# Patient Record
Sex: Female | Born: 1980 | Race: Black or African American | Hispanic: No | Marital: Married | Smoking: Former smoker
Health system: Southern US, Community
[De-identification: ages and names within clinical notes are randomized; demographics above are authoritative.]

## PROBLEM LIST (undated history)

## (undated) ENCOUNTER — Inpatient Hospital Stay (HOSPITAL_COMMUNITY): Payer: Self-pay

## (undated) DIAGNOSIS — R748 Abnormal levels of other serum enzymes: Secondary | ICD-10-CM

## (undated) DIAGNOSIS — A549 Gonococcal infection, unspecified: Secondary | ICD-10-CM

## (undated) DIAGNOSIS — K219 Gastro-esophageal reflux disease without esophagitis: Secondary | ICD-10-CM

## (undated) DIAGNOSIS — M549 Dorsalgia, unspecified: Secondary | ICD-10-CM

## (undated) DIAGNOSIS — R569 Unspecified convulsions: Secondary | ICD-10-CM

## (undated) DIAGNOSIS — J40 Bronchitis, not specified as acute or chronic: Secondary | ICD-10-CM

## (undated) DIAGNOSIS — O009 Unspecified ectopic pregnancy without intrauterine pregnancy: Secondary | ICD-10-CM

## (undated) DIAGNOSIS — I10 Essential (primary) hypertension: Secondary | ICD-10-CM

## (undated) DIAGNOSIS — G43909 Migraine, unspecified, not intractable, without status migrainosus: Secondary | ICD-10-CM

## (undated) DIAGNOSIS — M1990 Unspecified osteoarthritis, unspecified site: Secondary | ICD-10-CM

## (undated) DIAGNOSIS — J189 Pneumonia, unspecified organism: Secondary | ICD-10-CM

## (undated) DIAGNOSIS — K589 Irritable bowel syndrome without diarrhea: Secondary | ICD-10-CM

## (undated) DIAGNOSIS — F419 Anxiety disorder, unspecified: Secondary | ICD-10-CM

## (undated) DIAGNOSIS — I739 Peripheral vascular disease, unspecified: Secondary | ICD-10-CM

## (undated) DIAGNOSIS — G8929 Other chronic pain: Secondary | ICD-10-CM

## (undated) HISTORY — PX: ABDOMINAL HYSTERECTOMY: SHX81

## (undated) HISTORY — PX: WISDOM TOOTH EXTRACTION: SHX21

## (undated) HISTORY — PX: SALPINGECTOMY: SHX328

## (undated) HISTORY — PX: LAPAROSCOPY FOR ECTOPIC PREGNANCY: SUR765

---

## 1999-09-18 ENCOUNTER — Emergency Department (HOSPITAL_COMMUNITY): Admission: EM | Admit: 1999-09-18 | Discharge: 1999-09-18 | Payer: Self-pay

## 2000-04-11 ENCOUNTER — Inpatient Hospital Stay (HOSPITAL_COMMUNITY): Admission: EM | Admit: 2000-04-11 | Discharge: 2000-04-11 | Payer: Self-pay | Admitting: Obstetrics & Gynecology

## 2000-05-22 ENCOUNTER — Other Ambulatory Visit: Admission: RE | Admit: 2000-05-22 | Discharge: 2000-05-22 | Payer: Self-pay | Admitting: Obstetrics

## 2000-06-11 ENCOUNTER — Inpatient Hospital Stay (HOSPITAL_COMMUNITY): Admission: AD | Admit: 2000-06-11 | Discharge: 2000-06-11 | Payer: Self-pay | Admitting: Obstetrics

## 2000-09-17 ENCOUNTER — Observation Stay (HOSPITAL_COMMUNITY): Admission: AD | Admit: 2000-09-17 | Discharge: 2000-09-18 | Payer: Self-pay | Admitting: *Deleted

## 2000-09-22 ENCOUNTER — Inpatient Hospital Stay (HOSPITAL_COMMUNITY): Admission: AD | Admit: 2000-09-22 | Discharge: 2000-09-22 | Payer: Self-pay | Admitting: Obstetrics

## 2000-10-22 ENCOUNTER — Inpatient Hospital Stay (HOSPITAL_COMMUNITY): Admission: AD | Admit: 2000-10-22 | Discharge: 2000-10-22 | Payer: Self-pay | Admitting: Obstetrics

## 2000-10-24 ENCOUNTER — Inpatient Hospital Stay (HOSPITAL_COMMUNITY): Admission: AD | Admit: 2000-10-24 | Discharge: 2000-10-24 | Payer: Self-pay | Admitting: Obstetrics

## 2000-10-27 ENCOUNTER — Inpatient Hospital Stay (HOSPITAL_COMMUNITY): Admission: AD | Admit: 2000-10-27 | Discharge: 2000-10-27 | Payer: Self-pay | Admitting: Obstetrics

## 2000-11-06 ENCOUNTER — Observation Stay (HOSPITAL_COMMUNITY): Admission: AD | Admit: 2000-11-06 | Discharge: 2000-11-07 | Payer: Self-pay | Admitting: Obstetrics

## 2000-11-15 ENCOUNTER — Inpatient Hospital Stay (HOSPITAL_COMMUNITY): Admission: AD | Admit: 2000-11-15 | Discharge: 2000-11-18 | Payer: Self-pay | Admitting: Obstetrics

## 2000-11-21 ENCOUNTER — Encounter: Admission: RE | Admit: 2000-11-21 | Discharge: 2001-02-19 | Payer: Self-pay | Admitting: Obstetrics

## 2001-03-24 ENCOUNTER — Inpatient Hospital Stay (HOSPITAL_COMMUNITY): Admission: AD | Admit: 2001-03-24 | Discharge: 2001-03-24 | Payer: Self-pay | Admitting: Obstetrics

## 2001-05-17 ENCOUNTER — Emergency Department (HOSPITAL_COMMUNITY): Admission: EM | Admit: 2001-05-17 | Discharge: 2001-05-17 | Payer: Self-pay | Admitting: Emergency Medicine

## 2001-10-02 ENCOUNTER — Emergency Department (HOSPITAL_COMMUNITY): Admission: EM | Admit: 2001-10-02 | Discharge: 2001-10-02 | Payer: Self-pay | Admitting: Emergency Medicine

## 2001-10-29 ENCOUNTER — Inpatient Hospital Stay (HOSPITAL_COMMUNITY): Admission: AD | Admit: 2001-10-29 | Discharge: 2001-10-29 | Payer: Self-pay | Admitting: Obstetrics

## 2002-04-20 ENCOUNTER — Inpatient Hospital Stay (HOSPITAL_COMMUNITY): Admission: AD | Admit: 2002-04-20 | Discharge: 2002-04-20 | Payer: Self-pay | Admitting: *Deleted

## 2002-11-04 ENCOUNTER — Encounter: Payer: Self-pay | Admitting: Family Medicine

## 2002-11-04 ENCOUNTER — Inpatient Hospital Stay (HOSPITAL_COMMUNITY): Admission: AD | Admit: 2002-11-04 | Discharge: 2002-11-06 | Payer: Self-pay | Admitting: Family Medicine

## 2002-12-21 ENCOUNTER — Other Ambulatory Visit: Admission: RE | Admit: 2002-12-21 | Discharge: 2002-12-21 | Payer: Self-pay | Admitting: *Deleted

## 2002-12-21 ENCOUNTER — Encounter: Admission: RE | Admit: 2002-12-21 | Discharge: 2002-12-21 | Payer: Self-pay | Admitting: *Deleted

## 2003-07-24 ENCOUNTER — Emergency Department (HOSPITAL_COMMUNITY): Admission: EM | Admit: 2003-07-24 | Discharge: 2003-07-24 | Payer: Self-pay | Admitting: Emergency Medicine

## 2003-07-24 ENCOUNTER — Encounter: Payer: Self-pay | Admitting: Emergency Medicine

## 2003-12-22 ENCOUNTER — Inpatient Hospital Stay (HOSPITAL_COMMUNITY): Admission: AD | Admit: 2003-12-22 | Discharge: 2003-12-23 | Payer: Self-pay | Admitting: Obstetrics & Gynecology

## 2003-12-24 HISTORY — PX: TUBAL LIGATION: SHX77

## 2004-01-31 ENCOUNTER — Other Ambulatory Visit: Admission: RE | Admit: 2004-01-31 | Discharge: 2004-01-31 | Payer: Self-pay | Admitting: Obstetrics and Gynecology

## 2004-03-25 ENCOUNTER — Inpatient Hospital Stay (HOSPITAL_COMMUNITY): Admission: AD | Admit: 2004-03-25 | Discharge: 2004-03-25 | Payer: Self-pay | Admitting: Obstetrics and Gynecology

## 2004-05-02 ENCOUNTER — Inpatient Hospital Stay (HOSPITAL_COMMUNITY): Admission: AD | Admit: 2004-05-02 | Discharge: 2004-05-02 | Payer: Self-pay | Admitting: Obstetrics and Gynecology

## 2004-05-09 ENCOUNTER — Inpatient Hospital Stay (HOSPITAL_COMMUNITY): Admission: AD | Admit: 2004-05-09 | Discharge: 2004-05-09 | Payer: Self-pay | Admitting: Obstetrics and Gynecology

## 2004-05-16 ENCOUNTER — Encounter (HOSPITAL_COMMUNITY): Admission: RE | Admit: 2004-05-16 | Discharge: 2004-06-15 | Payer: Self-pay | Admitting: Obstetrics and Gynecology

## 2004-06-21 ENCOUNTER — Encounter (HOSPITAL_COMMUNITY): Admission: RE | Admit: 2004-06-21 | Discharge: 2004-07-21 | Payer: Self-pay | Admitting: Obstetrics and Gynecology

## 2004-06-21 ENCOUNTER — Inpatient Hospital Stay (HOSPITAL_COMMUNITY): Admission: AD | Admit: 2004-06-21 | Discharge: 2004-06-21 | Payer: Self-pay | Admitting: Obstetrics and Gynecology

## 2004-06-27 ENCOUNTER — Inpatient Hospital Stay (HOSPITAL_COMMUNITY): Admission: AD | Admit: 2004-06-27 | Discharge: 2004-06-30 | Payer: Self-pay | Admitting: Obstetrics and Gynecology

## 2004-06-27 ENCOUNTER — Inpatient Hospital Stay (HOSPITAL_COMMUNITY): Admission: AD | Admit: 2004-06-27 | Discharge: 2004-06-27 | Payer: Self-pay | Admitting: Obstetrics and Gynecology

## 2004-07-17 ENCOUNTER — Inpatient Hospital Stay: Admission: AD | Admit: 2004-07-17 | Discharge: 2004-07-17 | Payer: Self-pay | Admitting: Obstetrics and Gynecology

## 2004-07-30 ENCOUNTER — Encounter (HOSPITAL_COMMUNITY): Admission: RE | Admit: 2004-07-30 | Discharge: 2004-08-29 | Payer: Self-pay | Admitting: Obstetrics and Gynecology

## 2004-07-30 ENCOUNTER — Inpatient Hospital Stay (HOSPITAL_COMMUNITY): Admission: AD | Admit: 2004-07-30 | Discharge: 2004-07-30 | Payer: Self-pay | Admitting: Obstetrics and Gynecology

## 2004-08-03 ENCOUNTER — Inpatient Hospital Stay (HOSPITAL_COMMUNITY): Admission: AD | Admit: 2004-08-03 | Discharge: 2004-08-03 | Payer: Self-pay | Admitting: Obstetrics and Gynecology

## 2004-08-06 ENCOUNTER — Inpatient Hospital Stay (HOSPITAL_COMMUNITY): Admission: AD | Admit: 2004-08-06 | Discharge: 2004-08-06 | Payer: Self-pay | Admitting: Obstetrics and Gynecology

## 2004-08-07 ENCOUNTER — Inpatient Hospital Stay (HOSPITAL_COMMUNITY): Admission: AD | Admit: 2004-08-07 | Discharge: 2004-08-07 | Payer: Self-pay | Admitting: Obstetrics and Gynecology

## 2004-08-14 ENCOUNTER — Inpatient Hospital Stay (HOSPITAL_COMMUNITY): Admission: AD | Admit: 2004-08-14 | Discharge: 2004-08-14 | Payer: Self-pay | Admitting: Obstetrics and Gynecology

## 2004-09-03 ENCOUNTER — Inpatient Hospital Stay (HOSPITAL_COMMUNITY): Admission: AD | Admit: 2004-09-03 | Discharge: 2004-09-03 | Payer: Self-pay | Admitting: Obstetrics and Gynecology

## 2004-09-11 ENCOUNTER — Observation Stay (HOSPITAL_COMMUNITY): Admission: AD | Admit: 2004-09-11 | Discharge: 2004-09-12 | Payer: Self-pay | Admitting: Obstetrics and Gynecology

## 2004-09-15 ENCOUNTER — Inpatient Hospital Stay (HOSPITAL_COMMUNITY): Admission: AD | Admit: 2004-09-15 | Discharge: 2004-09-15 | Payer: Self-pay | Admitting: Obstetrics and Gynecology

## 2004-09-25 ENCOUNTER — Inpatient Hospital Stay (HOSPITAL_COMMUNITY): Admission: AD | Admit: 2004-09-25 | Discharge: 2004-09-27 | Payer: Self-pay | Admitting: Obstetrics and Gynecology

## 2004-09-26 ENCOUNTER — Encounter (INDEPENDENT_AMBULATORY_CARE_PROVIDER_SITE_OTHER): Payer: Self-pay | Admitting: Specialist

## 2005-01-03 ENCOUNTER — Emergency Department (HOSPITAL_COMMUNITY): Admission: EM | Admit: 2005-01-03 | Discharge: 2005-01-03 | Payer: Self-pay | Admitting: Family Medicine

## 2005-01-23 ENCOUNTER — Emergency Department (HOSPITAL_COMMUNITY): Admission: EM | Admit: 2005-01-23 | Discharge: 2005-01-23 | Payer: Self-pay | Admitting: Family Medicine

## 2005-02-12 ENCOUNTER — Emergency Department (HOSPITAL_COMMUNITY): Admission: EM | Admit: 2005-02-12 | Discharge: 2005-02-12 | Payer: Self-pay | Admitting: Family Medicine

## 2005-05-30 ENCOUNTER — Emergency Department (HOSPITAL_COMMUNITY): Admission: EM | Admit: 2005-05-30 | Discharge: 2005-05-30 | Payer: Self-pay | Admitting: Emergency Medicine

## 2005-08-06 ENCOUNTER — Emergency Department (HOSPITAL_COMMUNITY): Admission: EM | Admit: 2005-08-06 | Discharge: 2005-08-06 | Payer: Self-pay | Admitting: Family Medicine

## 2005-09-01 ENCOUNTER — Emergency Department (HOSPITAL_COMMUNITY): Admission: EM | Admit: 2005-09-01 | Discharge: 2005-09-01 | Payer: Self-pay | Admitting: Emergency Medicine

## 2005-09-18 ENCOUNTER — Emergency Department (HOSPITAL_COMMUNITY): Admission: EM | Admit: 2005-09-18 | Discharge: 2005-09-18 | Payer: Self-pay | Admitting: Family Medicine

## 2005-12-04 ENCOUNTER — Inpatient Hospital Stay (HOSPITAL_COMMUNITY): Admission: AD | Admit: 2005-12-04 | Discharge: 2005-12-04 | Payer: Self-pay | Admitting: Obstetrics and Gynecology

## 2005-12-12 ENCOUNTER — Inpatient Hospital Stay (HOSPITAL_COMMUNITY): Admission: AD | Admit: 2005-12-12 | Discharge: 2005-12-12 | Payer: Self-pay | Admitting: Obstetrics and Gynecology

## 2005-12-17 ENCOUNTER — Other Ambulatory Visit: Admission: RE | Admit: 2005-12-17 | Discharge: 2005-12-17 | Payer: Self-pay | Admitting: Obstetrics and Gynecology

## 2006-04-16 ENCOUNTER — Encounter: Admission: RE | Admit: 2006-04-16 | Discharge: 2006-04-16 | Payer: Self-pay | Admitting: Internal Medicine

## 2006-07-24 ENCOUNTER — Emergency Department (HOSPITAL_COMMUNITY): Admission: EM | Admit: 2006-07-24 | Discharge: 2006-07-24 | Payer: Self-pay | Admitting: Family Medicine

## 2006-09-22 ENCOUNTER — Emergency Department (HOSPITAL_COMMUNITY): Admission: EM | Admit: 2006-09-22 | Discharge: 2006-09-22 | Payer: Self-pay | Admitting: Emergency Medicine

## 2006-10-23 ENCOUNTER — Inpatient Hospital Stay (HOSPITAL_COMMUNITY): Admission: AD | Admit: 2006-10-23 | Discharge: 2006-10-23 | Payer: Self-pay | Admitting: Obstetrics and Gynecology

## 2007-01-05 ENCOUNTER — Emergency Department (HOSPITAL_COMMUNITY): Admission: EM | Admit: 2007-01-05 | Discharge: 2007-01-05 | Payer: Self-pay | Admitting: Emergency Medicine

## 2007-08-21 ENCOUNTER — Emergency Department (HOSPITAL_COMMUNITY): Admission: EM | Admit: 2007-08-21 | Discharge: 2007-08-21 | Payer: Self-pay | Admitting: Emergency Medicine

## 2007-08-27 ENCOUNTER — Emergency Department (HOSPITAL_COMMUNITY): Admission: EM | Admit: 2007-08-27 | Discharge: 2007-08-28 | Payer: Self-pay | Admitting: Emergency Medicine

## 2007-12-03 ENCOUNTER — Emergency Department (HOSPITAL_COMMUNITY): Admission: EM | Admit: 2007-12-03 | Discharge: 2007-12-03 | Payer: Self-pay | Admitting: Emergency Medicine

## 2008-01-18 ENCOUNTER — Emergency Department (HOSPITAL_COMMUNITY): Admission: EM | Admit: 2008-01-18 | Discharge: 2008-01-18 | Payer: Self-pay | Admitting: Family Medicine

## 2008-04-23 ENCOUNTER — Inpatient Hospital Stay (HOSPITAL_COMMUNITY): Admission: AD | Admit: 2008-04-23 | Discharge: 2008-04-24 | Payer: Self-pay | Admitting: Family Medicine

## 2008-09-15 ENCOUNTER — Emergency Department (HOSPITAL_COMMUNITY): Admission: EM | Admit: 2008-09-15 | Discharge: 2008-09-15 | Payer: Self-pay | Admitting: Family Medicine

## 2008-09-20 ENCOUNTER — Emergency Department (HOSPITAL_COMMUNITY): Admission: EM | Admit: 2008-09-20 | Discharge: 2008-09-20 | Payer: Self-pay | Admitting: Emergency Medicine

## 2009-01-31 ENCOUNTER — Emergency Department (HOSPITAL_COMMUNITY): Admission: EM | Admit: 2009-01-31 | Discharge: 2009-01-31 | Payer: Self-pay | Admitting: Emergency Medicine

## 2009-05-10 ENCOUNTER — Emergency Department (HOSPITAL_COMMUNITY): Admission: EM | Admit: 2009-05-10 | Discharge: 2009-05-10 | Payer: Self-pay | Admitting: Emergency Medicine

## 2009-06-22 ENCOUNTER — Emergency Department (HOSPITAL_COMMUNITY): Admission: EM | Admit: 2009-06-22 | Discharge: 2009-06-22 | Payer: Self-pay | Admitting: Family Medicine

## 2010-03-06 ENCOUNTER — Other Ambulatory Visit: Admission: RE | Admit: 2010-03-06 | Discharge: 2010-03-06 | Payer: Self-pay | Admitting: Family Medicine

## 2010-09-23 ENCOUNTER — Emergency Department (HOSPITAL_COMMUNITY): Admission: EM | Admit: 2010-09-23 | Discharge: 2010-09-23 | Payer: Self-pay | Admitting: Emergency Medicine

## 2011-02-08 ENCOUNTER — Emergency Department (HOSPITAL_COMMUNITY)
Admission: EM | Admit: 2011-02-08 | Discharge: 2011-02-09 | Disposition: A | Discharge status: Home / Self Care | Payer: BC Managed Care – PPO | Attending: Emergency Medicine | Admitting: Emergency Medicine

## 2011-02-08 DIAGNOSIS — R55 Syncope and collapse: Secondary | ICD-10-CM | POA: Insufficient documentation

## 2011-02-08 DIAGNOSIS — F17200 Nicotine dependence, unspecified, uncomplicated: Secondary | ICD-10-CM | POA: Insufficient documentation

## 2011-02-08 DIAGNOSIS — J45909 Unspecified asthma, uncomplicated: Secondary | ICD-10-CM | POA: Insufficient documentation

## 2011-02-09 LAB — DIFFERENTIAL
Basophils Absolute: 0 10*3/uL (ref 0.0–0.1)
Lymphs Abs: 3.4 10*3/uL (ref 0.7–4.0)
Neutrophils Relative %: 48 % (ref 43–77)

## 2011-02-09 LAB — URINALYSIS, ROUTINE W REFLEX MICROSCOPIC
Bilirubin Urine: NEGATIVE
Leukocytes, UA: NEGATIVE
Nitrite: NEGATIVE
Protein, ur: NEGATIVE mg/dL
Specific Gravity, Urine: 1.006 (ref 1.005–1.030)
Urine Glucose, Fasting: NEGATIVE mg/dL
Urobilinogen, UA: 0.2 mg/dL (ref 0.0–1.0)

## 2011-02-09 LAB — CBC
HCT: 37 % (ref 36.0–46.0)
MCH: 30.7 pg (ref 26.0–34.0)
MCV: 90.2 fL (ref 78.0–100.0)
Platelets: 226 10*3/uL (ref 150–400)
RBC: 4.1 MIL/uL (ref 3.87–5.11)
WBC: 8 10*3/uL (ref 4.0–10.5)

## 2011-02-09 LAB — POCT I-STAT, CHEM 8
BUN: <3 mg/dL — ABNORMAL LOW (ref 6–23)
Chloride: 107 mEq/L (ref 96–112)
HCT: 37 % (ref 36.0–46.0)
Potassium: 2.9 mEq/L — ABNORMAL LOW (ref 3.5–5.1)
TCO2: 20 mmol/L (ref 0–100)

## 2011-02-09 LAB — POCT PREGNANCY, URINE: Preg Test, Ur: NEGATIVE

## 2011-02-09 LAB — SAMPLE TO BLOOD BANK

## 2011-05-10 NOTE — Discharge Summary (Signed)
[name], [name]             ACCOUNT NO.:  [id number]   MEDICAL RECORD NO.:  [id number]          PATIENT TYPE:  INP   LOCATION:  9177                          [facility]ACILITY:  [facility]   PHYSICIAN:  Naima A. Dillard, M.D. DATE OF BIRTH:  02-27-1981   DATE OF ADMISSION:  09/11/2004  DATE OF DISCHARGE:  09/12/2004                                 DISCHARGE SUMMARY   ADMITTING DIAGNOSES:  1.  Intrauterine pregnancy at 35 and three-sevenths weeks.  2.  Early labor.  3.  History of preterm delivery.   DISCHARGE DIAGNOSES:  1.  Intrauterine pregnancy at 35 and four-sevenths weeks.  2.  Preterm contractions with no cervical dilation past 4 cm.   [name] is a 30 year old gravida 4 para 1-1-1-2 who presented to [facility] at 35 and three-sevenths weeks with uterine contractions and  cervical change to 4 cm.  At that time she was contracting every 5-10  minutes.  The fetal heart rate was reactive with no decelerations.  Her  cervix was noted to be 4 cm dilated, 80% effaced, with the vertex of the  fetus ballotable.  IV pain medicine was utilized which brought therapeutic  rest for the patient.  However, contractions ceased and she did not have any  further cervical dilation.  She was in stable condition and she was  therefore discharged home.  Throughout her admission, her NST remained  reactive, her contractions remained irregular, and her cervix was unchanged  at 4 cm.  She was discharged home for Dr. Jaymes Graff with labor  precautions and she will be seen in the office of [facility] later this week.  She  is to call for any further problems or concerns.      SDM/MEDQ  D:  09/12/2004  T:  09/13/2004  Job:  [id number]
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
## 2011-05-10 NOTE — Op Note (Signed)
[name], [name]             ACCOUNT NO.:  [id number]   MEDICAL RECORD NO.:  [id number]          PATIENT TYPE:  INP   LOCATION:  9135                          [facility]   PHYSICIAN:  Hal Morales, M.D.DATE OF BIRTH:  [birth year]   DATE OF PROCEDURE:  09/26/2004  DATE OF DISCHARGE:                                 OPERATIVE REPORT   PREOPERATIVE DIAGNOSIS:  Desire for surgical sterilization.   POSTOPERATIVE DIAGNOSIS:  Desire for surgical sterilization.   OPERATION:  Postpartum tubal sterilization.   ANESTHESIA:  General orotracheal.   ESTIMATED BLOOD LOSS:  Less than 10 cubic centimeters.   COMPLICATIONS:  None.   FINDINGS:  The tubes appeared normal for the postpartum state.   DESCRIPTION OF PROCEDURE:  A preoperative discussion was held with the  patient concerning the indications for her procedure as well as the risks  involved.  Her desire for surgical sterilization was reconfirmed and her  desire for no further pregnancies.  The patient acknowledged that she was  aware of temporary methods of contraception but wants permanent  sterilization.  I reviewed with her the risks of anesthesia, bleeding,  infection, damage to adjacent organs, and failure of tubal sterilization  resulting in subsequent pregnancy.  The patient acknowledged the review of  that and wished to proceed.  She was taken to the operating room after  appropriate identification and placed on the operating table.  After the  attainment of adequate general anesthesia, the abdomen was prepped with  multiple layers of Betadine and draped as a sterile field.  Subumbilical  injection of 10 cubic centimeters of 0.25% Marcaine was undertaken and a  subumbilical incision made.  The abdomen was opened down to the peritoneum  bluntly and retractors placed.  The left fallopian tube was identified,  followed to its fimbriated end, then grasped at the isthmic portion and  elevated.  A suture of 2-0 chromic  was placed through the mesosalpinx and  tied for and aft on a knuckle of tube and a second ligature placed proximal  to that.  The intervening knuckle of tube was excised and the cut ends  cauterized.  A similar procedure was carried out on the opposite side.  Hemostasis was noted to be adequate.  The abdominal peritoneum was closed in  a pursestring fashion with 0 Vicryl.  The fascia was closed in a running  fashion with 0 Vicryl.  A subcuticular suture of 3-0 Vicryl was used to  close the skin.  A sterile dressing was applied.  The patient was awakened  from general anesthesia and taken to the recovery room in satisfactory  condition having tolerated the procedure well with sponge and instrument  counts correct.   SPECIMENS TO PATHOLOGY:  Portions of right and left tube.    VPH/MEDQ  D:  09/26/2004  T:  09/26/2004  Job:  [id number]

## 2011-05-10 NOTE — H&P (Signed)
[name], [name]                       ACCOUNT NO.:  [id number]   MEDICAL RECORD NO.:  [id number]                   PATIENT TYPE:   LOCATION:                                       [facility]   PHYSICIAN:  Mary Sella. Orlene Erm, M.D.                 DATE OF BIRTH:  1981-09-22   DATE OF ADMISSION:  DATE OF DISCHARGE:                                HISTORY & PHYSICAL   HISTORY OF PRESENT ILLNESS:  The patient is a 30 year old black female  status post spontaneous vaginal delivery in November of 2003 who desires  permanent sterilization.  The patient signed bilateral tubal ligation papers  approximately four to six weeks ago and has been considering tubal ligation  and sterilization for some time.  The patient was counseled on the risks and  benefits of the surgery and desires sterilization.   PAST MEDICAL HISTORY:  None.   PAST SURGICAL HISTORY:  None.   PAST OBSTETRICAL HISTORY:  Spontaneous vaginal delivery x2.   PAST GYNECOLOGIC HISTORY:  Negative for history of gonorrhea and Chlamydia.  No abnormal Pap smears.   MEDICATIONS:  None.   ALLERGIES:  MUSCLE RELAXANT.   SOCIAL HISTORY:  The patient denies drug, alcohol, or tobacco abuse.   FAMILY HISTORY:  Noncontributory.   PHYSICAL EXAMINATION:  VITAL SIGNS:  Pulse 89, blood pressure 132/71, weight  135.4.  HEENT:  Normocephalic, atraumatic.  NECK:  Thyroid is without masses.  HEART:  Regular rate and rhythm without murmurs, rubs, gallops.  LUNGS:  Clear to auscultation bilaterally.  ABDOMEN:  Soft, nondistended, nontender.  No hepatosplenomegaly noted.  PELVIC:  EGBUS is normal.  Vagina is pink and rugated without lesions.  Cervix is parous.  Pap smear is obtained.  Bimanual examination reveals 8 cm  nontender uterus.  No adnexal masses are palpable.  EXTREMITIES:  Without clubbing, cyanosis, edema.   ASSESSMENT/PLAN:  A 30 year old gravida 2, para 2 black female with  undesired fertility.  I have discussed with  this patient at length the  permanence of this procedure and alternative forms of contraception.  In  addition, I have discussed with her the risk of bleeding, infection, injury  to internal organs, risk of requiring laparotomy.  I have  discussed with the patient the failure rate of 1 in 200 for this procedure  and increased risk of ectopic gestation.  The patient understands these  risks and wishes to proceed.  The patient will be scheduled for  sterilization in January of 2004.                                               Mary Sella. Orlene Erm, M.D.    EMH/MEDQ  D:  12/21/2002  T:  12/21/2002  Job:  [id number]   cc:  [facility]   [facility]
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
## 2011-05-10 NOTE — Discharge Summary (Signed)
NAMEREIGHN, KAPLAN NO.:  [id number]   MEDICAL RECORD NO.:  [id number]          PATIENT TYPE:  INP   LOCATION:  9135                          FACILITY:  [facility]   PHYSICIAN:  Osborn Coho, M.D.   DATE OF BIRTH:  [birth year]   DATE OF ADMISSION:  09/25/2004  DATE OF DISCHARGE:                                 DISCHARGE SUMMARY   ADMISSION DIAGNOSES:  1.  Intrauterine pregnancy at 37 and three-sevenths weeks.  2.  Early active labor.  3.  Desires elective sterilization.   DISCHARGE DIAGNOSES:  1.  Intrauterine pregnancy at 37 and three-sevenths weeks.  2.  Early active labor.  3.  Desires elective sterilization.  4.  Status post spontaneous vaginal delivery of a female infant, Apgars 9 and      9, weighing 7 pounds 1 ounce.  5.  Nuchal cord x1.  6.  Extra digit on the baby's left hand.  7.  Status post elective bilateral tubal ligation.   HOSPITAL PROCEDURES:  1.  Spontaneous vaginal delivery of a female infant, Apgars 9 and 9, weighing      7 pounds 1 ounce.  2.  General anesthesia.  3.  Elective postpartum bilateral tubal ligation.   HOSPITAL COURSE:  The patient was admitted in early active labor and was 5-6  cm shortly thereafter.  Amniotomy was performed and her labor progressed  within the next few hours to a vaginal birth with nuchal cord x1 and infant  position of ROP.  An extra digit was noted on the baby's left hand.  Otherwise, there was nothing out of the ordinary with the delivery.  The  patient was desiring of an elective tubal ligation, which was performed on  postpartum day #1.  She tolerated this well.  It was performed under general  anesthesia.  On postpartum day #2 and postoperative day #1 she was ready to  go home.  Vital signs were stable.  Maximum temperature was 99.  Chest was  clear, heart rate regular rate and rhythm.  Abdomen was soft and  appropriately tender.  Incision was clean, dry, and intact.  Lochia was  small, extremities  within normal limits.  She is deemed to have received the  full benefit of her hospital stay and was discharged home.   DISCHARGE MEDICATIONS:  1.  Motrin 600 mg p.o. q.6h. p.r.n.  2.  Tylox one to two p.o. q.4h. p.r.n.   DISCHARGE LABORATORY DATA:  White blood cell count 13.6, hemoglobin 9.8,  platelet count 257.  RPR nonreactive.   DISCHARGE INSTRUCTIONS:  Per [facility] handout.   DISCHARGE FOLLOW-UP:  In 6 weeks or p.r.n.      MLW/MEDQ  D:  09/27/2004  T:  09/27/2004  Job:  [id number]

## 2011-05-10 NOTE — Discharge Summary (Signed)
NAMECEDAR, [name]                       ACCOUNT NO.:  [id number]   MEDICAL RECORD NO.:  [id number]                   PATIENT TYPE:  INP   LOCATION:  9154                                 [facility]ACILITY:  [facility]   PHYSICIAN:  Janine Limbo, M.D.            DATE OF BIRTH:  Jul 03, 1981   DATE OF ADMISSION:  06/27/2004  DATE OF DISCHARGE:                                 DISCHARGE SUMMARY   ADMISSION DIAGNOSES:  1. Intrauterine pregnancy at 24 and three-sevenths weeks.  2. Premature labor with positive fetal fibronectin.   DISCHARGE DIAGNOSES:  1. Intrauterine pregnancy at 24 and three-sevenths weeks.  2. Premature labor with positive fetal fibronectin.   HOSPITAL PROCEDURES:  1. Electronic fetal monitoring.  2. Magnesium sulfate tocolysis.  3. Betamethasone and Delalutin injections.   HOSPITAL COURSE:  The patient was admitted with preterm labor that had been  unresponsive to terbutaline and Motrin.  She had a positive fetal  fibronectin and a cervix of 1, 50%, and high.  GC and chlamydia cultures on  June 26, 2004 were negative.  Ultrasound showed breech presentation with an  AFI of 4.8 in one pocket.  Cervix was 1.5 cm long.  Decision was made to  obtain a NICU consult and admit for magnesium sulfate tocolysis.  Delalutin  injections would continue.  She was also given a series of betamethasone.  On hospital day #2 she was doing well with occasional cramping.  Her chest  was clear, abdomen was gravid, nontender.  The second dose of betamethasone  was given.  Group B strep was negative and Delalutin was given.  BTL papers  were signed.  On hospital day #2 the patient was doing well with only a few  contractions.  Magnesium sulfate was discontinued in the evening.  Cervix  was fingertip, 25%, and ballotable which was considered unchanged.  Fetal  heart rate was stable with no contractions.  On June 30, 2004 the patient was  24 weeks and six-sevenths and was doing well.  She denied any  contractions,  leaking or bleeding.  Vital signs were stable.  Heart rate was respiratory  rate, chest was clear, abdomen was soft and nontender.  Fetal monitor  denoted no contractions and a fetal heart rate of 150s and the patient was  deemed stable enough to discharge home by Dr. Stefano Gaul and was discharged.   DISCHARGE MEDICATIONS:  Prenatal vitamins, terbutaline, and ibuprofen.   DISCHARGE LABORATORY DATA:  Group B strep was negative.  Hemoglobin 10.9,  platelets 228.  RPR nonreactive.  Urine was negative.   DISCHARGE INSTRUCTIONS:  Include bedrest at home and medications as ordered.   DISCHARGE FOLLOW-UP:  In 1 week at [facility] or p.r.n. with premature  labor precautions.    Marie L. Williams, C.N.M.                 Janine Limbo, M.D.   MLW/MEDQ  D:  06/30/2004  T:  06/30/2004  Job:  [id number]

## 2011-07-24 ENCOUNTER — Inpatient Hospital Stay (HOSPITAL_COMMUNITY)
Admission: AD | Admit: 2011-07-24 | Discharge: 2011-07-25 | Disposition: A | Discharge status: Home / Self Care | Payer: BC Managed Care – PPO | Source: Ambulatory Visit | Attending: Obstetrics and Gynecology | Admitting: Obstetrics and Gynecology

## 2011-07-25 NOTE — Progress Notes (Signed)
Pt not in lobby.  

## 2011-09-18 LAB — DIFFERENTIAL
Basophils Relative: 1
Lymphocytes Relative: 38
Lymphs Abs: 3.3
Monocytes Absolute: 0.9
Monocytes Relative: 11
Neutro Abs: 3.8
Neutrophils Relative %: 45

## 2011-09-18 LAB — COMPREHENSIVE METABOLIC PANEL
AST: 29
Alkaline Phosphatase: 116
CO2: 28
Chloride: 107
GFR calc Af Amer: >60
Potassium: 4
Sodium: 138

## 2011-09-18 LAB — CBC
HCT: 38.1
Hemoglobin: 13.1
RBC: 4.17
RDW: 12.9

## 2011-09-18 LAB — GC/CHLAMYDIA PROBE AMP, GENITAL: Chlamydia, DNA Probe: NEGATIVE

## 2011-09-18 LAB — POCT PREGNANCY, URINE: Preg Test, Ur: NEGATIVE

## 2011-09-18 LAB — WET PREP, GENITAL

## 2011-09-23 LAB — POCT RAPID STREP A: Streptococcus, Group A Screen (Direct): NEGATIVE

## 2011-10-02 ENCOUNTER — Inpatient Hospital Stay (INDEPENDENT_AMBULATORY_CARE_PROVIDER_SITE_OTHER)
Admission: RE | Admit: 2011-10-02 | Discharge: 2011-10-02 | Disposition: A | Discharge status: Home / Self Care | Payer: Medicaid Other | Source: Ambulatory Visit | Attending: Family Medicine | Admitting: Family Medicine

## 2011-10-02 DIAGNOSIS — R109 Unspecified abdominal pain: Secondary | ICD-10-CM

## 2011-10-02 LAB — POCT URINALYSIS DIP (DEVICE)
Glucose, UA: NEGATIVE mg/dL
Ketones, ur: NEGATIVE mg/dL
Leukocytes, UA: NEGATIVE
Protein, ur: NEGATIVE mg/dL

## 2011-10-02 LAB — POCT PREGNANCY, URINE: Preg Test, Ur: NEGATIVE

## 2011-10-02 LAB — GLUCOSE, CAPILLARY: Glucose-Capillary: 79 mg/dL (ref 70–99)

## 2011-10-04 LAB — I-STAT 8, (EC8 V) (CONVERTED LAB)
BUN: <3 — ABNORMAL LOW
Chloride: 97
Glucose, Bld: 112 — ABNORMAL HIGH
HCT: 42
Hemoglobin: 14.3
Operator id: 272551
Sodium: 133 — ABNORMAL LOW
pCO2, Ven: 42.7 — ABNORMAL LOW

## 2011-10-04 LAB — POCT PREGNANCY, URINE: Operator id: 277751

## 2012-02-06 ENCOUNTER — Emergency Department (HOSPITAL_COMMUNITY)
Admission: EM | Admit: 2012-02-06 | Discharge: 2012-02-06 | Disposition: A | Discharge status: Home / Self Care | Payer: Medicaid Other | Attending: Emergency Medicine | Admitting: Emergency Medicine

## 2012-02-06 ENCOUNTER — Emergency Department (HOSPITAL_COMMUNITY): Payer: Medicaid Other

## 2012-02-06 ENCOUNTER — Other Ambulatory Visit: Payer: Self-pay

## 2012-02-06 ENCOUNTER — Encounter (HOSPITAL_COMMUNITY): Payer: Self-pay | Admitting: Emergency Medicine

## 2012-02-06 DIAGNOSIS — J3489 Other specified disorders of nose and nasal sinuses: Secondary | ICD-10-CM | POA: Insufficient documentation

## 2012-02-06 DIAGNOSIS — R1110 Vomiting, unspecified: Secondary | ICD-10-CM | POA: Insufficient documentation

## 2012-02-06 DIAGNOSIS — R059 Cough, unspecified: Secondary | ICD-10-CM | POA: Insufficient documentation

## 2012-02-06 DIAGNOSIS — R079 Chest pain, unspecified: Secondary | ICD-10-CM | POA: Insufficient documentation

## 2012-02-06 DIAGNOSIS — R0602 Shortness of breath: Secondary | ICD-10-CM | POA: Insufficient documentation

## 2012-02-06 DIAGNOSIS — R197 Diarrhea, unspecified: Secondary | ICD-10-CM | POA: Insufficient documentation

## 2012-02-06 DIAGNOSIS — J069 Acute upper respiratory infection, unspecified: Secondary | ICD-10-CM | POA: Insufficient documentation

## 2012-02-06 DIAGNOSIS — R6889 Other general symptoms and signs: Secondary | ICD-10-CM | POA: Insufficient documentation

## 2012-02-06 DIAGNOSIS — R05 Cough: Secondary | ICD-10-CM | POA: Insufficient documentation

## 2012-02-06 DIAGNOSIS — R509 Fever, unspecified: Secondary | ICD-10-CM | POA: Insufficient documentation

## 2012-02-06 DIAGNOSIS — J45909 Unspecified asthma, uncomplicated: Secondary | ICD-10-CM | POA: Insufficient documentation

## 2012-02-06 MED ORDER — ALBUTEROL SULFATE HFA 108 (90 BASE) MCG/ACT IN AERS
2.0000 | INHALATION_SPRAY | Freq: Once | RESPIRATORY_TRACT | Status: AC
  Administered 2012-02-06: 2 via RESPIRATORY_TRACT
  Filled 2012-02-06: qty 6.7

## 2012-02-06 MED ORDER — ALBUTEROL SULFATE (5 MG/ML) 0.5% IN NEBU
5.0000 mg | INHALATION_SOLUTION | Freq: Once | RESPIRATORY_TRACT | Status: AC
  Administered 2012-02-06: 5 mg via RESPIRATORY_TRACT
  Filled 2012-02-06: qty 1

## 2012-02-06 MED ORDER — ONDANSETRON 8 MG PO TBDP: 8.0000 mg | ORAL_TABLET | Freq: Three times a day (TID) | ORAL | Status: AC | PRN

## 2012-02-06 MED ORDER — HYDROCOD POLST-CHLORPHEN POLST 10-8 MG/5ML PO LQCR: 5.0000 mL | Freq: Every evening | ORAL | Status: DC | PRN

## 2012-02-06 MED ORDER — IPRATROPIUM BROMIDE 0.02 % IN SOLN
0.5000 mg | Freq: Once | RESPIRATORY_TRACT | Status: AC
  Administered 2012-02-06: 0.5 mg via RESPIRATORY_TRACT
  Filled 2012-02-06: qty 2.5

## 2012-02-06 NOTE — ED Provider Notes (Signed)
History     CSN: [id number]  Arrival date & time 02/06/12  4098   First MD Initiated Contact with Patient 02/06/12 1003      Chief Complaint  Patient presents with  . Influenza  . Fever  . Cough  . Chest Pain    (Consider location/radiation/quality/duration/timing/severity/associated sxs/prior treatment) HPI History provided by pt.   Pt has had a cough for the past week.  Associated w/ fever, max temp 102, wheezing, SOB when she speaks, diffuse CP, particularly with deep inspiration and coughing, nasal congestion and scratchy throat.  Has also had 4 episodes of vomiting over the past 4 days and diarrhea this am.  Her children are sick with similar sx.  She has been alternating tylenol and motrin and using her child's albuterol inhaler which has given her some relief of SOB.  H/o asthma.   Past Medical History  Diagnosis Date  . Asthma     History reviewed. No pertinent past surgical history.  History reviewed. No pertinent family history.  History  Substance Use Topics  . Smoking status: Current Everyday Smoker  . Smokeless tobacco: Not on file  . Alcohol Use: Yes     occasional    OB History    Grav Para Term Preterm Abortions TAB SAB Ect Mult Living                  Review of Systems  All other systems reviewed and are negative.    Allergies  Vioxx  Home Medications   Current Outpatient Rx  Name Route Sig Dispense Refill  . ALBUTEROL SULFATE HFA 108 (90 BASE) MCG/ACT IN AERS Inhalation Inhale 2 puffs into the lungs every 6 (six) hours as needed. For shortness of breath    . TYLENOL COLD/FLU SEVERE PO Oral Take 1 tablet by mouth daily as needed. For cold and flu symptoms      BP 133/90  Pulse 80  Temp(Src) 98.4 F (36.9 C) (Oral)  Resp 16  SpO2 100%  Physical Exam  Nursing note and vitals reviewed. Constitutional: She is oriented to person, place, and time. She appears well-developed and well-nourished. No distress.  HENT:  Head: No trismus in  the jaw.  Right Ear: Tympanic membrane, external ear and ear canal normal.  Left Ear: Tympanic membrane, external ear and ear canal normal.  Mouth/Throat: Uvula is midline and mucous membranes are normal. No oropharyngeal exudate, posterior oropharyngeal edema or posterior oropharyngeal erythema.  Eyes:       Normal appearance  Neck: Normal range of motion. Neck supple.  Cardiovascular: Normal rate and regular rhythm.   Pulmonary/Chest: Effort normal and breath sounds normal.       Mild respiratory distress but able to complete sentences.  Coughing.   Abdominal: Soft. Bowel sounds are normal. She exhibits no distension.       Mild, diffuse ttp  Lymphadenopathy:    She has no cervical adenopathy.  Neurological: She is alert and oriented to person, place, and time.  Skin: Skin is warm and dry. No rash noted.  Psychiatric: She has a normal mood and affect. Her behavior is normal.    ED Course  Procedures (including critical care time)   Date: 02/06/2012  Rate: 89  Rhythm: normal sinus rhythm  QRS Axis: normal  Intervals: normal  ST/T Wave abnormalities: normal  Conduction Disutrbances:none  Narrative Interpretation:   Old EKG Reviewed: unchanged   Labs Reviewed - No data to display Dg Chest 2 View  02/06/2012  *  RADIOLOGY REPORT*  Clinical Data: Cough and chills.  CHEST - 2 VIEW  Comparison: 08/28/2007.  Findings: No infiltrate, congestive heart failure or pneumothorax. Heart size within normal limits.  IMPRESSION: No acute abnormality.  Original Report Authenticated By: Fuller Canada, M.D.     1. Viral upper respiratory illness   2. Vomiting and diarrhea   3. Asthma       MDM  31yo F w/ h/o asthma but otherwise healthy presents w/ URI sx x 1wk + N/V/D.  Afebrile, well-appearing, no respiratory distress, lungs clear, coughing, abd benign but diffusely, mildly ttp on exam.  Pt received a breathing treatment and reports that SOB and cough have improved.  CXR ordered d/t  duration of sx and associated SOB/fever but neg for pneumonia.  Viral URI likely exacerbated her asthma.  Viral etiology of GI sx most likely as well.  D/c'd home w/ an albuterol inhaler and prescriptions for tussionex and zofran.  Instructed to drink plenty of fluids and f/u with her PCP.  Return precautions discussed.         Otilio Miu, PA 02/06/12 [address] [place], [place] 02/06/12 2020

## 2012-02-06 NOTE — ED Notes (Signed)
Pt here c/o fever, body aches, cough and pain in chest with inspiration and cough x 6 days; pt sts some dizziness today; pt here with children that have similar sx

## 2012-02-06 NOTE — ED Provider Notes (Signed)
Medical screening examination/treatment/procedure(s) were performed by non-physician practitioner and as supervising physician I was immediately available for consultation/collaboration.  Cyndra Numbers, MD 02/06/12 [phone]

## 2012-02-06 NOTE — Discharge Instructions (Signed)
Take zofran as needed for nausea.  Use your albuterol inhaler as needed for cough, wheezing and shortness of breath.  Use tussionex if needed at night for cough.  Get rest and drink plenty of fluids.  Follow up with your primary care doctor if your not feeling better by Monday.  You may return to the ER if your symptoms worsen, particularly if you have increased difficulty breathing.

## 2012-05-29 ENCOUNTER — Inpatient Hospital Stay (HOSPITAL_COMMUNITY)
Admission: AD | Admit: 2012-05-29 | Discharge: 2012-05-29 | Disposition: A | Discharge status: Home / Self Care | Payer: Medicaid Other | Source: Ambulatory Visit | Attending: Obstetrics & Gynecology | Admitting: Obstetrics & Gynecology

## 2012-05-29 ENCOUNTER — Encounter (HOSPITAL_COMMUNITY): Payer: Self-pay | Admitting: *Deleted

## 2012-05-29 ENCOUNTER — Inpatient Hospital Stay (HOSPITAL_COMMUNITY): Payer: Medicaid Other

## 2012-05-29 DIAGNOSIS — N949 Unspecified condition associated with female genital organs and menstrual cycle: Secondary | ICD-10-CM | POA: Insufficient documentation

## 2012-05-29 DIAGNOSIS — O26899 Other specified pregnancy related conditions, unspecified trimester: Secondary | ICD-10-CM

## 2012-05-29 DIAGNOSIS — O99891 Other specified diseases and conditions complicating pregnancy: Secondary | ICD-10-CM | POA: Insufficient documentation

## 2012-05-29 DIAGNOSIS — R109 Unspecified abdominal pain: Secondary | ICD-10-CM | POA: Insufficient documentation

## 2012-05-29 HISTORY — DX: Irritable bowel syndrome, unspecified: K58.9

## 2012-05-29 LAB — CBC
MCH: 30.6 pg (ref 26.0–34.0)
MCHC: 33.6 g/dL (ref 30.0–36.0)
Platelets: 225 10*3/uL (ref 150–400)
RDW: 12.6 % (ref 11.5–15.5)

## 2012-05-29 LAB — DIFFERENTIAL
Basophils Relative: 1 % (ref 0–1)
Eosinophils Absolute: 0.2 10*3/uL (ref 0.0–0.7)
Neutrophils Relative %: 48 % (ref 43–77)

## 2012-05-29 LAB — URINALYSIS, ROUTINE W REFLEX MICROSCOPIC
Leukocytes, UA: NEGATIVE
Protein, ur: NEGATIVE mg/dL
Urobilinogen, UA: 0.2 mg/dL (ref 0.0–1.0)

## 2012-05-29 LAB — WET PREP, GENITAL
Clue Cells Wet Prep HPF POC: NONE SEEN
Trich, Wet Prep: NONE SEEN

## 2012-05-29 LAB — POCT PREGNANCY, URINE: Preg Test, Ur: POSITIVE — AB

## 2012-05-29 MED ORDER — HYDROMORPHONE HCL PF 1 MG/ML IJ SOLN
1.0000 mg | Freq: Once | INTRAMUSCULAR | Status: AC
  Administered 2012-05-29: 1 mg via INTRAMUSCULAR
  Filled 2012-05-29: qty 1

## 2012-05-29 NOTE — Progress Notes (Signed)
CNM informed of pt status, is coming to see pt.

## 2012-05-29 NOTE — MAU Note (Signed)
Patient state she has sudden onset of lower abdominal, lower back and rectal pain. Had a BTL 10-05.

## 2012-05-29 NOTE — MAU Provider Note (Signed)
History     CSN: [id number]  Arrival date and time: 05/29/12 2595   First Provider Initiated Contact with Patient 05/29/12 [phone]      Chief Complaint  Patient presents with  . Rectal Pain  . Abdominal Pain  . Back Pain   HPI This is a 31 y.o. female at [redacted]w[redacted]d who presents with severe rectal and pelvic pain.  Denies bleeding. Did miss a period this month. Had BTL in 2005. Denies fever. OB History    Grav Para Term Preterm Abortions TAB SAB Ect Mult Living   6 3 1 2 2  2   3       Past Medical History  Diagnosis Date  . Asthma   . Irritable bowel     Past Surgical History  Procedure Date  . Tubal ligation   . Wisdom tooth extraction     History reviewed. No pertinent family history.  History  Substance Use Topics  . Smoking status: Former Games developer  . Smokeless tobacco: Not on file  . Alcohol Use: Yes     occasional    Allergies:  Allergies  Allergen Reactions  . Vioxx (Rofecoxib) Hives    Prescriptions prior to admission  Medication Sig Dispense Refill  . albuterol (PROVENTIL HFA;VENTOLIN HFA) 108 (90 BASE) MCG/ACT inhaler Inhale 2 puffs into the lungs every 6 (six) hours as needed. For shortness of breath      . chlorpheniramine-HYDROcodone (TUSSIONEX PENNKINETIC ER) 10-8 MG/5ML LQCR Take 5 mLs by mouth at bedtime as needed.  50 mL  0  . Phenylephrine-DM-GG-APAP (TYLENOL COLD/FLU SEVERE PO) Take 1 tablet by mouth daily as needed. For cold and flu symptoms        ROS As listed in HPI  Physical Exam   Blood pressure 137/83, pulse 85, temperature 98.8 F (37.1 C), temperature source Oral, resp. rate 20, height 5\' 5"  (1.651 m), weight 176 lb 12.8 oz (80.196 kg), last menstrual period 04/23/2012, SpO2 100.00%.  Physical Exam  Constitutional: She is oriented to person, place, and time. She appears well-developed and well-nourished. No distress (but very uncomfortable).  HENT:  Head: Normocephalic.  Cardiovascular: Normal rate.   Respiratory: Effort normal.    GI: Soft. She exhibits no distension and no mass. There is tenderness. There is rebound and guarding.  Genitourinary: Vagina normal and uterus normal. No vaginal discharge found.       +CMT Cultures obtained   Musculoskeletal: Normal range of motion.  Neurological: She is alert and oriented to person, place, and time.  Skin: Skin is warm and dry.  Psychiatric: She has a normal mood and affect.   BLOOD TYPE PER RECORD 09/25/04  Is B+  Results for orders placed during the hospital encounter of 05/29/12 (from the past 24 hour(s))  URINALYSIS, ROUTINE W REFLEX MICROSCOPIC     Status: Normal   Collection Time   05/29/12  7:15 AM      Component Value Range   Color, Urine YELLOW  YELLOW    APPearance CLEAR  CLEAR    Specific Gravity, Urine 1.020  1.005 - 1.030    pH 6.0  5.0 - 8.0    Glucose, UA NEGATIVE  NEGATIVE (mg/dL)   Hgb urine dipstick NEGATIVE  NEGATIVE    Bilirubin Urine NEGATIVE  NEGATIVE    Ketones, ur NEGATIVE  NEGATIVE (mg/dL)   Protein, ur NEGATIVE  NEGATIVE (mg/dL)   Urobilinogen, UA 0.2  0.0 - 1.0 (mg/dL)   Nitrite NEGATIVE  NEGATIVE  Leukocytes, UA NEGATIVE  NEGATIVE   POCT PREGNANCY, URINE     Status: Abnormal   Collection Time   05/29/12  7:25 AM      Component Value Range   Preg Test, Ur POSITIVE (*) NEGATIVE   HCG, QUANTITATIVE, PREGNANCY     Status: Abnormal   Collection Time   05/29/12  7:55 AM      Component Value Range   hCG, Beta Chain, Quant, S 1132 (*) <5 (mIU/mL)  CBC     Status: Normal   Collection Time   05/29/12  7:55 AM      Component Value Range   WBC 5.9  4.0 - 10.5 (K/uL)   RBC 4.09  3.87 - 5.11 (MIL/uL)   Hemoglobin 12.5  12.0 - 15.0 (g/dL)   HCT 16.1  09.6 - 04.5 (%)   MCV 91.0  78.0 - 100.0 (fL)   MCH 30.6  26.0 - 34.0 (pg)   MCHC 33.6  30.0 - 36.0 (g/dL)   RDW 40.9  81.1 - 91.4 (%)   Platelets 225  150 - 400 (K/uL)  DIFFERENTIAL     Status: Normal   Collection Time   05/29/12  7:55 AM      Component Value Range   Neutrophils Relative  48  43 - 77 (%)   Neutro Abs 2.8  1.7 - 7.7 (K/uL)   Lymphocytes Relative 39  12 - 46 (%)   Lymphs Abs 2.3  0.7 - 4.0 (K/uL)   Monocytes Relative 10  3 - 12 (%)   Monocytes Absolute 0.6  0.1 - 1.0 (K/uL)   Eosinophils Relative 4  0 - 5 (%)   Eosinophils Absolute 0.2  0.0 - 0.7 (K/uL)   Basophils Relative 1  0 - 1 (%)   Basophils Absolute 0.0  0.0 - 0.1 (K/uL)  WET PREP, GENITAL     Status: Abnormal   Collection Time   05/29/12  7:55 AM      Component Value Range   Yeast Wet Prep HPF POC NONE SEEN  NONE SEEN    Trich, Wet Prep NONE SEEN  NONE SEEN    Clue Cells Wet Prep HPF POC NONE SEEN  NONE SEEN    WBC, Wet Prep HPF POC FEW (*) NONE SEEN       MAU Course  Procedures    MDM Discussed with Dr Penne Lash >> Dilaudid ordered.  Cultures done. [place] ordered. Quant and CBC/Diff ordered.  On return from ultrasound the patient states her pain is much less but she still feel rectal pressure  At 9:23 Reported HPI, Lab and ultrasound results to Dr. Debroah Loop as well as response to pain medication.  Order given to return in 48 hrs for repeat BHCG or sooner should sxs worsen. Discussed plan of care with the patient.  Assessment and Plan  A:  Abdominal pain in early pregnancy       P:  Return for repeat BHCG in 48 hrs or sooner for increased pain or bleeding  [facility] 05/29/2012, 8:03 AM

## 2012-05-29 NOTE — Discharge Instructions (Signed)
Abdominal Pain During Pregnancy Abdominal discomfort is common in pregnancy. Most of the time, it does not cause harm. There are many causes of abdominal pain. Some causes are more serious than others. Some of the causes of abdominal pain in pregnancy are easily diagnosed. Occasionally, the diagnosis takes time to understand. Other times, the cause is not determined. Abdominal pain can be a sign that something is very wrong with the pregnancy, or the pain may have nothing to do with the pregnancy at all. For this reason, always tell your caregiver if you have any abdominal discomfort. CAUSES Common and harmless causes of abdominal pain include:  Constipation.   Excess gas and bloating.   Round ligament pain. This is pain that is felt in the folds of the groin.   The position the baby or placenta is in.   Baby kicks.   Braxton-Hicks contractions. These are mild contractions that do not cause cervical dilation.  Serious causes of abdominal pain include:  Ectopic pregnancy. This happens when a fertilized egg implants outside of the uterus.   Miscarriage.   Preterm labor. This is when labor starts at less than 37 weeks of pregnancy.   Placental abruption. This is when the placenta partially or completely separates from the uterus.   Preeclampsia. This is often associated with high blood pressure and has been referred to as "toxemia in pregnancy."   Uterine or amniotic fluid infections.  Causes unrelated to pregnancy include:  Urinary tract infection.   Gallbladder stones or inflammation.   Hepatitis or other liver illness.   Intestinal problems, stomach flu, food poisoning, or ulcer.   Appendicitis.   Kidney (renal) stones.   Kidney infection (pylonephritis).  HOME CARE INSTRUCTIONS  For mild pain:  Do not have sexual intercourse or put anything in your vagina until your symptoms go away completely.   Get plenty of rest until your pain improves. If your pain does not  improve in 1 hour, call your caregiver.   Drink clear fluids if you feel nauseous. Avoid solid food as long as you are uncomfortable or nauseous.   Only take medicine as directed by your caregiver.   Keep all follow-up appointments with your caregiver.  SEEK IMMEDIATE MEDICAL CARE IF:  You are bleeding, leaking fluid, or passing tissue from the vagina.   You have increasing pain or cramping.   You have persistent vomiting.   You have painful or bloody urination.   You have a fever.   You notice a decrease in your baby's movements.   You have extreme weakness or feel faint.   You have shortness of breath, with or without abdominal pain.   You develop a severe headache with abdominal pain.   You have abnormal vaginal discharge with abdominal pain.   You have persistent diarrhea.   You have abdominal pain that continues even after rest, or gets worse.  MAKE SURE YOU:   Understand these instructions.   Will watch your condition.   Will get help right away if you are not doing well or get worse.  Document Released: 12/09/2005 Document Revised: 11/28/2011 Document Reviewed: 07/05/2011 [facility] Patient Information 2012 [place], [place].Ectopic Pregnancy An ectopic pregnancy happens when a fertilized egg grows outside the uterus. A pregnancy cannot live outside of the uterus. This problem often happens in the fallopian tube. It is often caused by damage to the fallopian tube. If this problem is found early, you may be treated with medicine. If your tube tears or bursts open (  ruptures), you will bleed inside. This is an emergency. You will need surgery. Get help right away.  SYMPTOMS You may have normal pregnancy symptoms at first. These include: Missing your period.  Feeling sick to your stomach (nauseous).  Being tired.  Having tender breasts.  Then, you may start to have symptoms that are not normal. These include: Pain with sex (intercourse).  Bleeding from the vagina.  This includes light bleeding (spotting).  Belly (abdomen) or lower belly cramping or pain. This may be felt on one side.  A fast heartbeat (pulse).  Passing out (fainting) after going poop (bowel movement).  If your tube tears, you may have symptoms such as: Really bad pain in the belly or lower belly. This happens suddenly.  Dizziness.  Passing out.  Shoulder pain.  GET HELP RIGHT AWAY IF:  You have any symptoms that are not normal. This is an emergency. Document Released: 03/07/2009 Document Revised: 11/28/2011 Document Reviewed: 08/15/2011 [facility] Patient Information 2012 [place], [place].  I gave you this discharge information for you to know what to look for that would need sooner evaluation

## 2012-05-30 ENCOUNTER — Inpatient Hospital Stay (HOSPITAL_COMMUNITY): Payer: Medicaid Other | Admitting: Anesthesiology

## 2012-05-30 ENCOUNTER — Inpatient Hospital Stay (HOSPITAL_COMMUNITY): Payer: Medicaid Other

## 2012-05-30 ENCOUNTER — Encounter (HOSPITAL_COMMUNITY): Payer: Self-pay | Admitting: Anesthesiology

## 2012-05-30 ENCOUNTER — Encounter (HOSPITAL_COMMUNITY): Payer: Self-pay | Admitting: Obstetrics and Gynecology

## 2012-05-30 ENCOUNTER — Ambulatory Visit (HOSPITAL_COMMUNITY)
Admission: AD | Admit: 2012-05-30 | Discharge: 2012-05-30 | Disposition: A | Discharge status: Home / Self Care | Payer: Medicaid Other | Source: Ambulatory Visit | Attending: Obstetrics & Gynecology | Admitting: Obstetrics & Gynecology

## 2012-05-30 ENCOUNTER — Encounter (HOSPITAL_COMMUNITY)
Admission: AD | Disposition: A | Discharge status: Home / Self Care | Payer: Self-pay | Source: Ambulatory Visit | Attending: Obstetrics & Gynecology

## 2012-05-30 DIAGNOSIS — O00109 Unspecified tubal pregnancy without intrauterine pregnancy: Secondary | ICD-10-CM | POA: Diagnosis not present

## 2012-05-30 DIAGNOSIS — O2690 Pregnancy related conditions, unspecified, unspecified trimester: Secondary | ICD-10-CM

## 2012-05-30 HISTORY — PX: LAPAROSCOPY: SHX197

## 2012-05-30 LAB — GC/CHLAMYDIA PROBE AMP, GENITAL
Chlamydia, DNA Probe: NEGATIVE
GC Probe Amp, Genital: NEGATIVE

## 2012-05-30 LAB — CBC
Hemoglobin: 12.5 g/dL (ref 12.0–15.0)
Platelets: 253 10*3/uL (ref 150–400)
RBC: 4.16 MIL/uL (ref 3.87–5.11)
WBC: 6.3 10*3/uL (ref 4.0–10.5)

## 2012-05-30 SURGERY — LAPAROSCOPY OPERATIVE
Anesthesia: General | Site: Abdomen | Wound class: Clean Contaminated

## 2012-05-30 MED ORDER — ONDANSETRON HCL 4 MG/2ML IJ SOLN
INTRAMUSCULAR | Status: DC | PRN
  Administered 2012-05-30: 4 mg via INTRAVENOUS

## 2012-05-30 MED ORDER — LACTATED RINGERS IR SOLN
Status: DC | PRN
  Administered 2012-05-30: 3000 mL

## 2012-05-30 MED ORDER — NEOSTIGMINE METHYLSULFATE 1 MG/ML IJ SOLN
INTRAMUSCULAR | Status: DC | PRN
  Administered 2012-05-30: 4 mg via INTRAVENOUS

## 2012-05-30 MED ORDER — BUPIVACAINE HCL (PF) 0.25 % IJ SOLN
INTRAMUSCULAR | Status: DC | PRN
  Administered 2012-05-30: 9 mL

## 2012-05-30 MED ORDER — GLYCOPYRROLATE 0.2 MG/ML IJ SOLN
INTRAMUSCULAR | Status: DC | PRN
  Administered 2012-05-30: .8 mg via INTRAVENOUS

## 2012-05-30 MED ORDER — MEPERIDINE HCL 25 MG/ML IJ SOLN: 6.2500 mg | INTRAMUSCULAR | Status: DC | PRN

## 2012-05-30 MED ORDER — LACTATED RINGERS IV SOLN
INTRAVENOUS | Status: DC | PRN
  Administered 2012-05-30 (×3): via INTRAVENOUS

## 2012-05-30 MED ORDER — DIPHENHYDRAMINE HCL 25 MG PO CAPS: 25.0000 mg | ORAL_CAPSULE | Freq: Four times a day (QID) | ORAL | Status: DC | PRN

## 2012-05-30 MED ORDER — DIPHENHYDRAMINE HCL 50 MG/ML IJ SOLN
25.0000 mg | Freq: Once | INTRAMUSCULAR | Status: AC
  Administered 2012-05-30: 25 mg via INTRAVENOUS
  Filled 2012-05-30: qty 1

## 2012-05-30 MED ORDER — LACTATED RINGERS IV BOLUS (SEPSIS)
1000.0000 mL | Freq: Once | INTRAVENOUS | Status: AC
  Administered 2012-05-30: 1000 mL via INTRAVENOUS

## 2012-05-30 MED ORDER — LIDOCAINE HCL (CARDIAC) 20 MG/ML IV SOLN
INTRAVENOUS | Status: DC | PRN
  Administered 2012-05-30: 40 mg via INTRAVENOUS

## 2012-05-30 MED ORDER — FENTANYL CITRATE 0.05 MG/ML IJ SOLN
INTRAMUSCULAR | Status: DC | PRN
  Administered 2012-05-30: 100 ug via INTRAVENOUS
  Administered 2012-05-30: 150 ug via INTRAVENOUS
  Administered 2012-05-30: 100 ug via INTRAVENOUS

## 2012-05-30 MED ORDER — HYDROMORPHONE HCL PF 1 MG/ML IJ SOLN
0.2500 mg | INTRAMUSCULAR | Status: DC | PRN
  Administered 2012-05-30: 0.25 mg via INTRAVENOUS

## 2012-05-30 MED ORDER — PROPOFOL 10 MG/ML IV EMUL
INTRAVENOUS | Status: DC | PRN
  Administered 2012-05-30: 160 mg via INTRAVENOUS

## 2012-05-30 MED ORDER — DEXAMETHASONE SODIUM PHOSPHATE 4 MG/ML IJ SOLN
INTRAMUSCULAR | Status: DC | PRN
  Administered 2012-05-30: 10 mg via INTRAVENOUS

## 2012-05-30 MED ORDER — ROCURONIUM BROMIDE 100 MG/10ML IV SOLN
INTRAVENOUS | Status: DC | PRN
  Administered 2012-05-30: 2.5 mg via INTRAVENOUS
  Administered 2012-05-30: 30 mg via INTRAVENOUS

## 2012-05-30 MED ORDER — HYDROMORPHONE HCL PF 1 MG/ML IJ SOLN
INTRAMUSCULAR | Status: AC
  Filled 2012-05-30: qty 1

## 2012-05-30 MED ORDER — BUPIVACAINE HCL (PF) 0.25 % IJ SOLN
INTRAMUSCULAR | Status: AC
  Filled 2012-05-30: qty 30

## 2012-05-30 MED ORDER — FENTANYL CITRATE 0.05 MG/ML IJ SOLN
100.0000 ug | Freq: Once | INTRAMUSCULAR | Status: AC
  Administered 2012-05-30: 100 ug via INTRAVENOUS
  Filled 2012-05-30: qty 2

## 2012-05-30 MED ORDER — CEFAZOLIN SODIUM 1-5 GM-% IV SOLN
INTRAVENOUS | Status: DC | PRN
  Administered 2012-05-30: 1 g via INTRAVENOUS

## 2012-05-30 MED ORDER — CEFAZOLIN SODIUM 1-5 GM-% IV SOLN
1.0000 g | INTRAVENOUS | Status: DC
  Filled 2012-05-30: qty 50

## 2012-05-30 MED ORDER — PROMETHAZINE HCL 25 MG/ML IJ SOLN: 6.2500 mg | INTRAMUSCULAR | Status: DC | PRN

## 2012-05-30 MED ORDER — MIDAZOLAM HCL 5 MG/5ML IJ SOLN
INTRAMUSCULAR | Status: DC | PRN
  Administered 2012-05-30: 2 mg via INTRAVENOUS

## 2012-05-30 MED ORDER — IBUPROFEN 600 MG PO TABS: 600.0000 mg | ORAL_TABLET | Freq: Four times a day (QID) | ORAL | Status: AC | PRN

## 2012-05-30 MED ORDER — OXYCODONE-ACETAMINOPHEN 5-325 MG PO TABS: 1.0000 | ORAL_TABLET | Freq: Four times a day (QID) | ORAL | Status: DC | PRN

## 2012-05-30 MED ORDER — HYDROMORPHONE HCL PF 1 MG/ML IJ SOLN
2.0000 mg | Freq: Once | INTRAMUSCULAR | Status: AC
  Administered 2012-05-30: 2 mg via INTRAVENOUS
  Filled 2012-05-30: qty 2

## 2012-05-30 MED ORDER — SUCCINYLCHOLINE CHLORIDE 20 MG/ML IJ SOLN
INTRAMUSCULAR | Status: DC | PRN
  Administered 2012-05-30: 120 mg via INTRAVENOUS

## 2012-05-30 MED ORDER — DOCUSATE SODIUM 100 MG PO CAPS: 100.0000 mg | ORAL_CAPSULE | Freq: Two times a day (BID) | ORAL | Status: AC | PRN

## 2012-05-30 MED ORDER — KETOROLAC TROMETHAMINE 30 MG/ML IJ SOLN
INTRAMUSCULAR | Status: DC | PRN
  Administered 2012-05-30: 30 mg via INTRAVENOUS

## 2012-05-30 MED ORDER — OXYCODONE-ACETAMINOPHEN 5-325 MG PO TABS
ORAL_TABLET | ORAL | Status: AC
  Administered 2012-05-30: 1 via ORAL
  Filled 2012-05-30: qty 1

## 2012-05-30 MED ORDER — LACTATED RINGERS IV SOLN
INTRAVENOUS | Status: DC
  Administered 2012-05-30: 125 mL/h via INTRAVENOUS

## 2012-05-30 SURGICAL SUPPLY — 32 items
ADH SKN CLS APL DERMABOND .7 (GAUZE/BANDAGES/DRESSINGS) ×1
BAG SPEC RTRVL LRG 6X4 10 (ENDOMECHANICALS)
CABLE HIGH FREQUENCY MONO STRZ (ELECTRODE) IMPLANT
CHLORAPREP W/TINT 26ML (MISCELLANEOUS) ×2 IMPLANT
CLOTH BEACON ORANGE TIMEOUT ST (SAFETY) ×2 IMPLANT
DERMABOND ADVANCED (GAUZE/BANDAGES/DRESSINGS) ×1
DERMABOND ADVANCED .7 DNX12 (GAUZE/BANDAGES/DRESSINGS) IMPLANT
FORCEPS CUTTING 33CM 5MM (CUTTING FORCEPS) ×1 IMPLANT
GLOVE BIO SURGEON STRL SZ7 (GLOVE) ×2 IMPLANT
GLOVE BIOGEL PI IND STRL 7.0 (GLOVE) ×2 IMPLANT
GLOVE BIOGEL PI INDICATOR 7.0 (GLOVE) ×2
GOWN PREVENTION PLUS LG XLONG (DISPOSABLE) ×2 IMPLANT
GOWN STRL REIN XL XLG (GOWN DISPOSABLE) ×2 IMPLANT
NDL INSUFFLATION 14GA 120MM (NEEDLE) ×1 IMPLANT
NEEDLE GYRUS 33CM (NEEDLE) IMPLANT
NEEDLE INSUFFLATION 14GA 120MM (NEEDLE) ×2 IMPLANT
NS IRRIG 1000ML POUR BTL (IV SOLUTION) ×1 IMPLANT
PACK LAPAROSCOPY BASIN (CUSTOM PROCEDURE TRAY) ×2 IMPLANT
POUCH SPECIMEN RETRIEVAL 10MM (ENDOMECHANICALS) IMPLANT
PROTECTOR NERVE ULNAR (MISCELLANEOUS) ×2 IMPLANT
SEALER TISSUE G2 CVD JAW 35 (ENDOMECHANICALS) IMPLANT
SEALER TISSUE G2 CVD JAW 45CM (ENDOMECHANICALS) IMPLANT
SET IRRIG TUBING LAPAROSCOPIC (IRRIGATION / IRRIGATOR) ×1 IMPLANT
SUT VIC AB 3-0 X1 27 (SUTURE) IMPLANT
SUT VICRYL 0 UR6 27IN ABS (SUTURE) ×4 IMPLANT
SUT VICRYL 4-0 PS2 18IN ABS (SUTURE) ×2 IMPLANT
TOWEL OR 17X24 6PK STRL BLUE (TOWEL DISPOSABLE) ×4 IMPLANT
TRAY FOLEY CATH 14FR (SET/KITS/TRAYS/PACK) ×2 IMPLANT
TROCAR XCEL NON-BLD 11X100MML (ENDOMECHANICALS) ×2 IMPLANT
TROCAR XCEL NON-BLD 5MMX100MML (ENDOMECHANICALS) ×4 IMPLANT
WARMER LAPAROSCOPE (MISCELLANEOUS) ×1 IMPLANT
WATER STERILE IRR 1000ML POUR (IV SOLUTION) ×1 IMPLANT

## 2012-05-30 NOTE — Op Note (Signed)
[name] PROCEDURE DATE: 05/30/2012  PREOPERATIVE DIAGNOSIS: Right tubal ectopic pregnancy POSTOPERATIVE DIAGNOSIS: Ruptured right fallopian tube ectopic pregnancy PROCEDURE: Laparoscopic right salpingectomy and removal of ectopic pregnancy SURGEON:  [name] ANESTHESIOLOGIST: Sharon Blalock, CRNA; Sharon Gala Scharlene Corn., [name]   INDICATIONS: 31 y.o. [phone] at [redacted]w[redacted]d here for with ruptured ectopic pregnancy. On exam, she had stable vital signs, and an acute abdomen. Hgb 12.5, blood type B+.  Patient was counseled regarding need for laparoscopic salpingectomy. Risks of surgery including bleeding which may require transfusion or reoperation, infection, injury to bowel or other surrounding organs, need for additional procedures including laparotomy and other postoperative/anesthesia complications were explained to patient.  Written informed consent was obtained.  FINDINGS:  Moderate amount of hemoperitoneum estimated to be about 300 ml of blood and clots.  Dilated right fallopian tube containing ectopic gestation. Small normal appearing uterus, normal left fallopian tube after tubal ligation, right ovary and left ovary.  ANESTHESIA: General INTRAVENOUS FLUIDS: 1800 ml ESTIMATED BLOOD LOSS:  Minimal during surgery; hemoperitoneum as above URINE OUTPUT: 50 ml SPECIMENS: Right fallopian tube containing ectopic gestation COMPLICATIONS: None immediate  PROCEDURE IN DETAIL:  The patient was taken to the operating room where general anesthesia was administered and was found to be adequate.  She was placed in the dorsal lithotomy position, and was prepped and draped in a sterile manner.  A Foley catheter was inserted into her bladder and attached to constant drainage and a uterine manipulator was then advanced into the uterus .  After an adequate timeout was performed, attention was then turned to the patient's abdomen where a 10-mm skin incision was made on the umbilical fold.  The  Veress needle was carefully introduced into the peritoneal cavity at a 45-degree angle into the abdominal wall.  Intraperitoneal placement was confirmed by drop in intraabdominal pressure with insufflation of carbon dioxide gas.  Adequate pneumoperitoneum was obtained, and the 10-mm trocar and sleeve were then advanced without difficulty into the abdomen where intraabdominal placement was confirmed by the laparoscope. A survey of the patient's pelvis and abdomen revealed the findings as above.  Two (10-mm and 5-mm) left lower quadrant ports were placed under direct visualization.  The Nezhat suction irrigator was then used to suction the hemoperitoneum and irrigate the pelvis.  Attention was then turned to the right fallopian tube which was grasped and ligated from the underlying mesosalpinx and uterine attachment using the Gyrus instrument.  Good hemostasis was noted.  The specimen was placed in an EndoCatch bag and removed from the abdomen intact.  The abdomen was desufflated, and all instruments were removed.  The fascial incisions of the 10-mm site was reapproximated with a 0 Vicryl figure-of-eight stitch; and all skin incisions were closed with Dermabond. The patient tolerated the procedure well.  All instruments, needles, and sponge counts were correct x 2. The patient was taken to the recovery room in stable condition.   The patient will be discharged to home as per PACU criteria.  Routine postoperative instructions given.  She was prescribed Percocet, Ibuprofen and Colace.  She will follow up in the clinic in 3-4 weeks for postoperative evaluation .

## 2012-05-30 NOTE — Discharge Instructions (Signed)
Ectopic Pregnancy An ectopic pregnancy is when the fertilized egg attaches (implants) outside the uterus. Most ectopic pregnancies occur in the fallopian tube. Rarely do ectopic pregnancies occur on the ovary, intestine, pelvis, or cervix. An ectopic pregnancy does not have the ability to develop into a normal, healthy baby.  A ruptured ectopic pregnancy is one in which the fallopian tube gets torn or bursts and results in internal bleeding. Often there is intense abdominal pain, and sometimes, vaginal bleeding. Having an ectopic pregnancy can be a life-threatening experience. If left untreated, this dangerous condition can lead to a blood transfusion, abdominal surgery, or even death. CAUSES  Damage to the fallopian tubes is the suspected cause in most ectopic pregnancies.  RISK FACTORS Depending on your circumstances, the amount of risk of having an ectopic pregnancy will vary. There are 3 categories that may help you identify whether you are potentially at risk. High Risk  You have gone through infertility treatment.   You have had a previous ectopic pregnancy.   You have had previous tubal surgery.   You have had previous surgery to have the fallopian tubes tied (tubal ligation).   You have tubal problems or diseases.   You have been exposed to DES. DES is a medicine that was used until 1971 and had effects on babies whose mothers took the medicine.   You become pregnant while using an intrauterine device (IUD) for birth control.  Moderate Risk  You have a history of infertility.   You have a history of a sexually transmitted infection (STI).   You have a history of pelvic inflammatory disease (PID).   You have scarring from endometriosis.   You have multiple sexual partners.   You smoke.  Low Risk  You have had previous pelvic surgery.   You use vaginal douching.   You became sexually active before 31 years of age.  SYMPTOMS  An ectopic pregnancy should be suspected  in anyone who has missed a period and has abdominal pain or bleeding.  You may experience normal pregnancy symptoms, such as:   Nausea.   Tiredness.   Breast tenderness.   Symptoms that are not normal include:   Pain with intercourse.   Irregular vaginal bleeding or spotting.   Cramping or pain on one side, or in the lower abdomen.   Fast heartbeat.   Passing out while having a bowel movement.   Symptoms of a ruptured ectopic pregnancy and internal bleeding may include:   Sudden, severe pain in the abdomen and pelvis.   Dizziness or fainting.   Pain in the shoulder area.  DIAGNOSIS  Tests that may be performed include:  A pregnancy test.   An ultrasound.   Testing the specific level of pregnancy hormone in the bloodstream.   Taking a sample of uterus tissue (dilation and curettage, D&C).   Surgery to perform a visual exam of the inside of the abdomen using a lighted tube (laparoscopy).  TREATMENT  An injection of methotrexate medicine may be given. This is given if:  The diagnosis is made early.   The fallopian tube has not ruptured.   You are considered to be a good candidate for the medicine.  Usually, pregnancy hormone blood levels are checked after methotrexate treatment. This is to be sure the medicine is effective. It may take 4 to 6 weeks for the pregnancy to be absorbed (though most pregnancies will be absorbed by 3 weeks). Surgical treatment may be needed. A lighted tube (laparoscope) is   used to remove the tubal pregnancy. If severe internal bleeding occurs, a cut (incision) may be made in the lower abdomen (laparotomy), and the ectopic pregnancy is removed. This stops the bleeding. Part of the fallopian tube, or the whole tube, may be removed as well (salpingectomy). After surgery, pregnancy hormone tests may be done to be sure there is no pregnancy tissue left. You may receive a RhoGAM shot if you are Rh negative and the father is Rh positive, or if you do  not know the Rh type of the father. This is to prevent problems with any future pregnancy. SEEK IMMEDIATE MEDICAL CARE IF:  You have any symptoms of an ectopic pregnancy. This is a medical emergency. Document Released: 01/16/2005 Document Revised: 11/28/2011 Document Reviewed: 08/15/2011 [facility] Patient Information 2012 [place], [place].  Salpingectomy Salpingectomy, also called tubectomy, is the removal of one of the fallopian tubes with surgery. The fallopian tubes are the tubes that are connected to the uterus. These tubes transport the egg from the ovary to the womb (uterus). Removing one tube does not prevent you from becoming pregnant. Salpingectomy does not have any adverse affects on your menstrual periods. There are many reasons to have a salpingectomy, such as if:  You have a tubal (ectopic) pregnancy. This is especially true if the tube ruptures.   You have an infected tube.   It is necessary to remove the tube when removing an ovary with a cyst or tumor.   The uterus needs to be removed.   You need surgery for cancer of the tube or other female organs.  LET YOUR CAREGIVER KNOW ABOUT:  Allergies to foods or medications.   All the medications you are taking. This includes over-the-counter and prescription drugs, herbs, eye drops, creams and steroids.   If you are using illegal drugs or drinking alcohol.   Your smoking habits.   Previous problems with anesthesia including numbing medication.   The possibility of being pregnant.   History of blood clotting or other blood problems.   Previous surgery.   Other medical or health problems.  RISKS AND COMPLICATIONS   Injury to surrounding organs.   Bleeding.   Infection.   The surgery does not correct the problem.   You have problems with the anesthesia.  BEFORE THE PROCEDURE  Do not take aspirin or blood thinners. They can cause bleeding.   Do not eat or drink anything at least 8 hours before the surgery.    Let your caregiver know if you develop a cold or an infection.   If you are being admitted the day of surgery, arrive at least one hour before the surgery to read and sign the necessary forms and consents.   Arrange for help when you go home from the hospital.   If you smoke, do not smoke for at least 2 weeks before the surgery.  PROCEDURE  You will be given an IV (intravenous) and a medication to relax you. Then, you will be put to sleep with a drug (anesthetic). Any hair on your lower belly (abdomen) will be removed and a catheter will be placed in your bladder. Through 2 very small cuts (incisions) if you have a laparoscopy, or a large incision in the lower abdomen, the fallopian tube will be removed from where it attaches to the uterus. The blood vessels will be clamped and tied.  AFTER THE PROCEDURE   You will be taken to the recovery room and observed for 1 to 3 hours.  If you had a laparoscopy, you may be discharged after several hours.   If you had a large incision, you will be admitted to the hospital for a couple of days.   If you had a laparoscopy, you may have shoulder pain. This is not unusual and is from some air that is left in the abdomen. This air affects the nerve that goes from the diaphragm to the shoulder. It goes away in a day or two.   You will be given pain medication if needed.   The intravenous and catheter will be removed before you are discharged.   Have someone available to take you home.  Document Released: 04/27/2009 Document Revised: 11/28/2011 Document Reviewed: 04/27/2009 [facility] Patient Information 2012 [place], [place].Salpingectomy Salpingectomy, also called tubectomy, is the removal of one of the fallopian tubes with surgery. The fallopian tubes are the tubes that are connected to the uterus. These tubes transport the egg from the ovary to the womb (uterus). Removing one tube does not prevent you from becoming pregnant. Salpingectomy does not have  any adverse affects on your menstrual periods. There are many reasons to have a salpingectomy, such as if:  You have a tubal (ectopic) pregnancy. This is especially true if the tube ruptures.   You have an infected tube.   It is necessary to remove the tube when removing an ovary with a cyst or tumor.   The uterus needs to be removed.   You need surgery for cancer of the tube or other female organs.  LET YOUR CAREGIVER KNOW ABOUT:  Allergies to foods or medications.   All the medications you are taking. This includes over-the-counter and prescription drugs, herbs, eye drops, creams and steroids.   If you are using illegal drugs or drinking alcohol.   Your smoking habits.   Previous problems with anesthesia including numbing medication.   The possibility of being pregnant.   History of blood clotting or other blood problems.   Previous surgery.   Other medical or health problems.  RISKS AND COMPLICATIONS   Injury to surrounding organs.   Bleeding.   Infection.   The surgery does not correct the problem.   You have problems with the anesthesia.  BEFORE THE PROCEDURE  Do not take aspirin or blood thinners. They can cause bleeding.   Do not eat or drink anything at least 8 hours before the surgery.   Let your caregiver know if you develop a cold or an infection.   If you are being admitted the day of surgery, arrive at least one hour before the surgery to read and sign the necessary forms and consents.   Arrange for help when you go home from the hospital.   If you smoke, do not smoke for at least 2 weeks before the surgery.  PROCEDURE  You will be given an IV (intravenous) and a medication to relax you. Then, you will be put to sleep with a drug (anesthetic). Any hair on your lower belly (abdomen) will be removed and a catheter will be placed in your bladder. Through 2 very small cuts (incisions) if you have a laparoscopy, or a large incision in the lower abdomen,  the fallopian tube will be removed from where it attaches to the uterus. The blood vessels will be clamped and tied.  AFTER THE PROCEDURE   You will be taken to the recovery room and observed for 1 to 3 hours.   If you had a laparoscopy, you may be discharged  after several hours.   If you had a large incision, you will be admitted to the hospital for a couple of days.   If you had a laparoscopy, you may have shoulder pain. This is not unusual and is from some air that is left in the abdomen. This air affects the nerve that goes from the diaphragm to the shoulder. It goes away in a day or two.   You will be given pain medication if needed.   The intravenous and catheter will be removed before you are discharged.   Have someone available to take you home.  Document Released: 04/27/2009 Document Revised: 11/28/2011 Document Reviewed: 04/27/2009 [facility] Patient Information 2012 [place], [place].

## 2012-05-30 NOTE — Progress Notes (Signed)
D/c to main entrance to husband in wheelchair.

## 2012-05-30 NOTE — Anesthesia Preprocedure Evaluation (Addendum)
Anesthesia Evaluation  Patient identified by MRN, date of birth, ID band Patient awake    Reviewed: Allergy & Precautions, H&P , NPO status , Patient's Chart, lab work & pertinent test results  Airway Mallampati: I TM Distance: >3 FB Neck ROM: full    Dental No notable dental hx. (+) Teeth Intact   Pulmonary  No inhaler use for a year   Pulmonary exam normal       Cardiovascular negative cardio ROS      Neuro/Psych negative neurological ROS  negative psych ROS   GI/Hepatic negative GI ROS, Neg liver ROS,   Endo/Other  negative endocrine ROS  Renal/GU negative Renal ROS  negative genitourinary   Musculoskeletal negative musculoskeletal ROS (+)   Abdominal Normal abdominal exam  (+)   Peds negative pediatric ROS (+)  Hematology negative hematology ROS (+)   Anesthesia Other Findings   Reproductive/Obstetrics negative OB ROS                           Anesthesia Physical Anesthesia Plan  ASA: II and Emergent  Anesthesia Plan: General   Post-op Pain Management:    Induction: Intravenous, Rapid sequence and Cricoid pressure planned  Airway Management Planned: Oral ETT  Additional Equipment:   Intra-op Plan:   Post-operative Plan: Extubation in OR  Informed Consent: I have reviewed the patients History and Physical, chart, labs and discussed the procedure including the risks, benefits and alternatives for the proposed anesthesia with the patient or authorized representative who has indicated his/her understanding and acceptance.   Dental Advisory Given  Plan Discussed with: CRNA and Surgeon  Anesthesia Plan Comments:         Anesthesia Quick Evaluation

## 2012-05-30 NOTE — Transfer of Care (Signed)
Immediate Anesthesia Transfer of Care Note  Patient: [name]  Procedure(s) Performed: Procedure(s) (LRB): LAPAROSCOPY OPERATIVE (N/A)  Patient Location: PACU  Anesthesia Type: General  Level of Consciousness: alert , oriented and sedated  Airway & Oxygen Therapy: Patient Spontanous Breathing and Patient connected to nasal cannula oxygen  Post-op Assessment: Report given to PACU RN and Post -op Vital signs reviewed and stable  Post vital signs: stable  Complications: No apparent anesthesia complications

## 2012-05-30 NOTE — Anesthesia Postprocedure Evaluation (Signed)
Anesthesia Post Note  Patient: [name]  Procedure(s) Performed: Procedure(s) (LRB): LAPAROSCOPY OPERATIVE (N/A)  Anesthesia type: General  Patient location: PACU  Post pain: Pain level controlled  Post assessment: Post-op Vital signs reviewed  Last Vitals:  Filed Vitals:   05/30/12 2032  BP: 114/71  Pulse: 68  Temp: 37.2 C  Resp: 18    Post vital signs: Reviewed  Level of consciousness: sedated  Complications: No apparent anesthesia complicationsfj

## 2012-05-30 NOTE — Progress Notes (Signed)
Dr. Macon Large at bedside discussing with pt and her spouse their options for ectopic pregnancy: 1) surgical removal of affected tube and pregnancy or 2) MTX injection.

## 2012-05-30 NOTE — MAU Provider Note (Signed)
History   Pt presents today c/o sudden onset of severe lower abd pain. She was seen on 05/29/12 with suspected ectopic preg and was instructed to f/u here for repeat B-quant. She states the pain became extremely bad this am and she is finding it difficult to move. She denies vag bleeding, fever, or any other sx at this time.  CSN: [id number]  Arrival date and time: 05/30/12 1112   First Provider Initiated Contact with Patient 05/30/12 1121    HPI  OB History    Grav Para Term Preterm Abortions TAB SAB Ect Mult Living   6 3 1 2 2  2   3       Past Medical History  Diagnosis Date  . Irritable bowel   . Asthma     meds as needed    Past Surgical History  Procedure Date  . Wisdom tooth extraction   . Tubal ligation 2005    History reviewed. No pertinent family history.  History  Substance Use Topics  . Smoking status: Former Games developer  . Smokeless tobacco: Not on file  . Alcohol Use: Yes     occasional    Allergies:  Allergies  Allergen Reactions  . Vioxx (Rofecoxib) Hives    Prescriptions prior to admission  Medication Sig Dispense Refill  . acetaminophen (TYLENOL) 500 MG tablet Take 500 mg by mouth every 6 (six) hours as needed. pain      . albuterol (PROVENTIL HFA;VENTOLIN HFA) 108 (90 BASE) MCG/ACT inhaler Inhale 2 puffs into the lungs every 6 (six) hours as needed. For shortness of breath        Review of Systems  Constitutional: Negative for fever and chills.  Eyes: Negative for blurred vision and double vision.  Respiratory: Negative for cough, hemoptysis, sputum production, shortness of breath and wheezing.   Cardiovascular: Negative for chest pain and palpitations.  Gastrointestinal: Positive for abdominal pain. Negative for nausea, vomiting, diarrhea and constipation.  Genitourinary: Negative for dysuria, urgency, frequency and hematuria.  Neurological: Negative for dizziness.  Psychiatric/Behavioral: Negative for depression and suicidal ideas.   Physical  Exam   Blood pressure 149/86, pulse 72, temperature 98.1 F (36.7 C), temperature source Oral, resp. rate 36, last menstrual period 04/23/2012.  Physical Exam  Constitutional: She is oriented to person, place, and time. She appears well-developed. She appears distressed.  Cardiovascular: Normal rate and regular rhythm.   Respiratory: Effort normal and breath sounds normal.  GI: Soft. There is tenderness in the right lower quadrant. There is no rigidity, no rebound and no guarding.  Musculoskeletal: Normal range of motion.  Neurological: She is oriented to person, place, and time.  Psychiatric: She has a normal mood and affect.   05/30/2012  TRANSVAGINAL OB ULTRASOUND Clinical Data: Right lower quadrant pain.  Positive Beta HCG, 1132 on 05/29/12.   Technique:  Transvaginal ultrasound was performed for evaluation of the gestation as well as the maternal uterus and adnexal regions.  Comparison: OB ultrasound 05/29/2012  Findings:  The endometrial stripe is thickened, measuring up to 15 mm.  No intrauterine gestational sac is identified.  Adjacent to but separate from the right ovary is a 1.8 x 1.8 x 3.5 cm solid mass, highly suspicious for ectopic pregnancy.  The patient reports some tenderness with endovaginal ultrasound of the right adnexa.  Ovaries are within normal limits.  There is a small amount of simple-appearing free fluid.  IMPRESSION:  1.  3.5 x 1.8 x 1.8 cm right adnexal mass is highly suspicious  for ectopic pregnancy.  This finding was called to a physician in the MAU at 1:00 pm on 05/30/2012.  She states that the patient is already being treated for ectopic pregnancy. 2.  No intrauterine pregnancy. 3.  Small amount of free fluid in the pelvis.  Original Report Authenticated By: Britta Mccreedy, M.D.   Lab Results  Component Value Date   WBC 6.3 05/30/2012   HGB 12.5 05/30/2012   HCT 37.2 05/30/2012   MCV 89.4 05/30/2012   PLT 253 05/30/2012     MAU Course  Procedures  MDM Dr. Macon Large in to  discuss options with patient for ectopic pregnancy management.  RICE, EASTON M 05/30/2012, 11:44 AM     Attestation of Attending Supervision of Advanced Practitioner: Evaluation and management procedures were performed by the PA under my supervision and collaboration. Chart reviewed, and agree with management. 31 y.o. [id number] with history of BTL, now with R tubal ectopic pregnancy.   On exam, she had stable vital signs, and Hgb 12.5, B+. Offered patient methotrexate versus surgical management.  Risks and benefits reviewed for both modalities.  Patient desires surgical management with laparoscopic right salpingectomy, possible left salpingectomy if tube looks abnormal.  Risks of surgery including bleeding which may require transfusion or reoperation, infection, injury to bowel or other surrounding organs, need for additional procedures including laparoscopy or laparotomy were explained to patient and written informed consent was obtained.  Patient has been NPO since  0730 and she will remain NPO for procedure, tentatively booked at 1530. Anesthesia and OR aware.  Preoperative prophylactic Ancef 1g IV is ordered on call to the OR.  To OR when ready.  Anticipate discharge to home after surgery if all goes as planned; follow up in 4 weeks in clinic.   Jaynie Collins, M.D. 05/30/2012 1:18 PM

## 2012-05-30 NOTE — Progress Notes (Signed)
Anesthesiologist Dr. Arby Barrette notified by Dr. Macon Large of pt's last meal of waffles and sausage.  Dr. Arby Barrette states the surgery will need to occur after 1530. Pt notified of potential surgery.

## 2012-05-30 NOTE — H&P (Signed)
History and Physical  HPI Pt presents today c/o sudden onset of severe lower abd pain. She was seen on 05/29/12 with suspected ectopic preg and was instructed to f/u here for repeat B-quant. She states the pain became extremely bad this am and she is finding it difficult to move. She denies vag bleeding, fever, or any other sx at this time.  CSN: [id number] Arrival date and time: 05/30/12 1112 First Provider Initiated Contact with Patient 05/30/12 1121     OB History      Grav  Para  Term  Preterm  Abortions  TAB  SAB  Ect  Mult  Living     6        3            1         2             2                      2                       3     Past Medical History   .  Irritable bowel     .  Asthma         meds as needed      Past Surgical History   .  Wisdom tooth extraction     .  Tubal ligation  2005      Social and Family History   Substance Use Topics   .  Smoking status:  Former Games developer   .  Smokeless tobacco:  Not on file   .  Alcohol Use:  Yes         occasional   No pertinent family history.  Allergies:   Marland Kitchen  Vioxx (Rofecoxib) and Latex Hives      Prescriptions prior to admission   .  acetaminophen (TYLENOL) 500 MG tablet  Take 500 mg by mouth every 6 (six) hours as needed. pain         .  albuterol (PROVENTIL HFA;VENTOLIN HFA) 108 (90 BASE) MCG/ACT inhaler  Inhale 2 puffs into the lungs every 6 (six) hours as needed. For shortness of breath          Review of Systems  Constitutional: Negative for fever and chills.  Eyes: Negative for blurred vision and double vision.  Respiratory: Negative for cough, hemoptysis, sputum production, shortness of breath and wheezing.   Cardiovascular: Negative for chest pain and palpitations.  Gastrointestinal: Positive for abdominal pain. Negative for nausea, vomiting, diarrhea and constipation.  Genitourinary: Negative for dysuria, urgency, frequency and hematuria.  Neurological: Negative for dizziness.  Psychiatric/Behavioral: Negative for  depression and suicidal ideas.     Physical Exam   Blood pressure 149/86, pulse 72, temperature 98.1 F (36.7 C), temperature source Oral, resp. rate 36, last menstrual period 04/23/2012. Constitutional: She is oriented to person, place, and time. She appears well-developed. She appears distressed.  Cardiovascular: Normal rate and regular rhythm.   Respiratory: Effort normal and breath sounds normal.  GI: Soft. There is tenderness in the right lower quadrant. There is no rigidity, no rebound and no guarding.  Musculoskeletal: Normal range of motion.  Neurological: She is oriented to person, place, and time.  Psychiatric: She has a normal mood and affect.   05/30/2012  TRANSVAGINAL OB ULTRASOUND Clinical Data: Right lower quadrant pain.  Positive Beta HCG, 1132 on  05/29/12.   Technique:  Transvaginal ultrasound was performed for evaluation of the gestation as well as the maternal uterus and adnexal regions.  Comparison: OB ultrasound 05/29/2012  Findings:  The endometrial stripe is thickened, measuring up to 15 mm.  No intrauterine gestational sac is identified.  Adjacent to but separate from the right ovary is a 1.8 x 1.8 x 3.5 cm solid mass, highly suspicious for ectopic pregnancy.  The patient reports some tenderness with endovaginal ultrasound of the right adnexa.  Ovaries are within normal limits.  There is a small amount of simple-appearing free fluid.  IMPRESSION:  1.  3.5 x 1.8 x 1.8 cm right adnexal mass is highly suspicious for ectopic pregnancy.  This finding was called to a physician in the MAU at 1:00 pm on 05/30/2012.  She states that the patient is already being treated for ectopic pregnancy. 2.  No intrauterine pregnancy. 3.  Small amount of free fluid in the pelvis.  Original Report Authenticated By: Britta Mccreedy, M.D.     Lab Results   Component  Value  Date     WBC  6.3  05/30/2012     HGB  12.5  05/30/2012     HCT  37.2  05/30/2012     MCV  89.4  05/30/2012     PLT  253  05/30/2012     MDM Dr. Macon Large in to discuss options with patient for ectopic pregnancy management.  RICE, EASTON M 05/30/2012, 11:44 AM     Attestation of Attending Supervision of Advanced Practitioner: Evaluation and management procedures were performed by the PA under my supervision and collaboration. Chart reviewed, and agree with management. 31 y.o. [id number] with history of BTL, now with R tubal ectopic pregnancy.   On exam, she had stable vital signs, and Hgb 12.5, B+. Offered patient methotrexate versus surgical management.  Risks and benefits reviewed for both modalities.  Patient desires surgical management with laparoscopic right salpingectomy, possible left salpingectomy if tube looks abnormal.  Risks of surgery including bleeding which may require transfusion or reoperation, infection, injury to bowel or other surrounding organs, need for additional procedures including laparoscopy or laparotomy were explained to patient and written informed consent was obtained.  Patient has been NPO since  0730 and she will remain NPO for procedure, tentatively booked at 1530. Anesthesia and OR aware.  Preoperative prophylactic Ancef 1g IV is ordered on call to the OR.  To OR when ready.  Anticipate discharge to home after surgery if all goes as planned; follow up in 4 weeks in clinic.   Jaynie Collins, M.D. 05/30/2012 1:18 PM

## 2012-05-30 NOTE — MAU Note (Signed)
"  I'm in severe pain that started about 0700 or 0800.  I was here yesterday and diagnosed with an ectopic pregnancy.  I had my tubes tied 2005 (Dr. Pennie Rushing or Dr. Normand Sloop).  I haven't been to them since then."

## 2012-05-30 NOTE — Anesthesia Procedure Notes (Signed)
Procedure Name: Intubation Performed by: Jearldine Cassady D Pre-anesthesia Checklist: Patient identified, Emergency Drugs available, Suction available, Patient being monitored and Timeout performed Patient Re-evaluated:Patient Re-evaluated prior to inductionOxygen Delivery Method: Circle system utilized Preoxygenation: Pre-oxygenation with 100% oxygen Intubation Type: IV induction, Rapid sequence and Cricoid Pressure applied Laryngoscope Size: Mac and 3 Grade View: Grade I Tube type: Oral Tube size: 7.0 mm Number of attempts: 1 Airway Equipment and Method: Stylet Placement Confirmation: ETT inserted through vocal cords under direct vision,  positive ETCO2 and breath sounds checked- equal and bilateral Secured at: 22 cm Tube secured with: Tape Dental Injury: Teeth and Oropharynx as per pre-operative assessment

## 2012-05-31 ENCOUNTER — Encounter (HOSPITAL_COMMUNITY): Payer: Self-pay | Admitting: Obstetrics & Gynecology

## 2012-06-02 ENCOUNTER — Inpatient Hospital Stay (HOSPITAL_COMMUNITY): Payer: Medicaid Other

## 2012-06-02 ENCOUNTER — Inpatient Hospital Stay (HOSPITAL_COMMUNITY)
Admission: AD | Admit: 2012-06-02 | Discharge: 2012-06-02 | Disposition: A | Discharge status: Home / Self Care | Payer: Medicaid Other | Source: Ambulatory Visit | Attending: Family Medicine | Admitting: Family Medicine

## 2012-06-02 ENCOUNTER — Encounter (HOSPITAL_COMMUNITY): Payer: Self-pay | Admitting: *Deleted

## 2012-06-02 DIAGNOSIS — G8918 Other acute postprocedural pain: Secondary | ICD-10-CM | POA: Diagnosis not present

## 2012-06-02 DIAGNOSIS — R0602 Shortness of breath: Secondary | ICD-10-CM | POA: Insufficient documentation

## 2012-06-02 LAB — CBC
HCT: 31.4 % — ABNORMAL LOW (ref 36.0–46.0)
MCV: 91 fL (ref 78.0–100.0)
Platelets: 226 10*3/uL (ref 150–400)
RBC: 3.45 MIL/uL — ABNORMAL LOW (ref 3.87–5.11)
WBC: 6.1 10*3/uL (ref 4.0–10.5)

## 2012-06-02 LAB — BASIC METABOLIC PANEL
CO2: 26 mEq/L (ref 19–32)
Chloride: 106 mEq/L (ref 96–112)
Creatinine, Ser: 0.61 mg/dL (ref 0.50–1.10)

## 2012-06-02 MED ORDER — SODIUM CHLORIDE 0.9 % IV SOLN
INTRAVENOUS | Status: DC
  Administered 2012-06-02: 13:00:00 via INTRAVENOUS

## 2012-06-02 MED ORDER — SODIUM CHLORIDE 0.9 % IV BOLUS (SEPSIS): 1000.0000 mL | Freq: Once | INTRAVENOUS | Status: DC

## 2012-06-02 MED ORDER — HYDROCODONE-ACETAMINOPHEN 7.5-500 MG PO TABS: 1.0000 | ORAL_TABLET | Freq: Four times a day (QID) | ORAL | Status: AC | PRN

## 2012-06-02 MED ORDER — IOHEXOL 300 MG/ML  SOLN
100.0000 mL | Freq: Once | INTRAMUSCULAR | Status: AC | PRN
  Administered 2012-06-02: 100 mL via INTRAVENOUS

## 2012-06-02 NOTE — MAU Note (Signed)
Had surgery for ectopic pregnancy on Saturday, 6/8 by Dr. Macon Large. States has felt short of breath since. Was told she could return to work yesterday, but did not feel well enough. Was told by her job to get a release prior to returning.

## 2012-06-02 NOTE — MAU Note (Signed)
States percocet and ibuprofen ar not relieving her pain.

## 2012-06-02 NOTE — MAU Provider Note (Signed)
Chart reviewed and agree with management and plan.  

## 2012-06-02 NOTE — Discharge Instructions (Signed)
Pain Medicine Instructions  You have been given a prescription for pain medicines. These medicines may affect your ability to think clearly. They may also affect your ability to perform physical activities. Take these medicines only as needed for pain. You do not need to take them if you are not having pain, unless directed by your caregiver. You can take less than the prescribed dose if you find a smaller amount of medicine controls the pain. It may not be possible to make all of your pain go away, but you should be comfortable enough to move, breathe, and take care of yourself.  After you start taking pain medicines, while taking the medicines, and for 8 hours after stopping the medicines:   Do not drive.   Do not operate machinery.   Do not operate power tools.   Do not sign legal documents.   Do not supervise children by yourself.   Do not participate in activities that require climbing or being in high places.   Do not enter a body of water (lake, river, ocean, spa, swimming pool) without an adult nearby who can help you.  You may have been prescribed a pain medicine that contains acetaminophen (paracetamol). If so, take only the amount directed by your caregiver. Do not take any other acetaminophen while taking this medicine. An overdose of acetaminophen can result in severe liver damage. If you are taking other medicines, check the active ingredients for acetaminophen. Acetaminophen is found in hundreds of over-the-counter and prescription medicines. These include cold relief products, menstrual cramp relief medicines, fever-reducing medicines, acid indigestion relief products, and pain relief products.  HOME CARE INSTRUCTIONS    Do not drink alcohol, take sleeping pills, or take other medicines until at least 8 hours after your last dose of pain medicine, or as directed by your caregiver.   Use a bulk stool softener if you become constipated from your pain medicines. Increasing your intake of fruits  and vegetables will also help.   Write down the times when you take your medicines. Look at the times before taking your next dose of medicine. It is easy to become confused while on pain medicines. Recording the times helps you to avoid an overdose.  SEEK MEDICAL CARE IF:   Your medicine is not helping the pain go away.   You vomit or have diarrhea shortly after taking the medicine.   You develop new pain in areas that did not hurt before.  SEEK IMMEDIATE MEDICAL CARE IF:   You feel dizzy or faint.   You feel there are other problems that might be caused by your medicine.  MAKE SURE YOU:    Understand these instructions.   Will watch your condition.   Will get help right away if you are not doing well or get worse.  Document Released: 03/17/2001 Document Revised: 11/28/2011 Document Reviewed: 11/23/2010  ExitCare Patient Information 2012 [employer].

## 2012-06-02 NOTE — MAU Provider Note (Addendum)
History    CSN: [id number] Arrival date and time: 06/02/12 1107  First Provider Initiated Contact with Patient 06/02/12 1203    Chief Complaint  Patient presents with  . Shortness of Breath  . Post-op Problem   HPI Comments: Pt underwent laparoscopic surgery for tubal pregnancy on 05/30/2012.  She has had progressive dyspnea since that time.  Reports pleuritic pain as well as RUQ and surgical site pain.  Worse with inspiration.  Was in bed for 2 days.  Tried to return to work but pain too poorly controlled.  On Percocet and Ibuprofen and doesn't notice a difference with medications.  Requesting something stronger  Shortness of Breath This is a new problem. The current episode started in the past 7 days. The problem occurs constantly. The problem has been gradually worsening. Associated symptoms include abdominal pain, chest pain, a fever (subjective only), leg pain (Bilateral) and leg swelling (bilateral). Pertinent negatives include no claudication, coryza, ear pain, headaches, hemoptysis, neck pain, orthopnea, PND, rash, rhinorrhea, sore throat, sputum production, swollen glands, syncope, vomiting or wheezing. The symptoms are aggravated by any activity. Risk factors: Recent Surgery, No OCPs, +family history, +recent smoker; quit 4 weeks ago. She has tried beta agonist inhalers (used her son's albuterol nebulizer; subjectively improved; no reported wheezing) for the symptoms. The treatment provided mild relief. Her past medical history is significant for asthma and a recent surgery. There is no history of allergies, aspirin allergies, bronchiolitis, CAD, chronic lung disease, COPD, DVT, a heart failure, PE or pneumonia.   OB History    Grav Para Term Preterm Abortions TAB SAB Ect Mult Living   6 3 1 2 2  2   3      Past Medical History  Diagnosis Date  . Irritable bowel   . Asthma     meds as needed   Past Surgical History  Procedure Date  . Wisdom tooth extraction   . Tubal ligation  2005  . Laparoscopy 05/30/2012    Procedure: LAPAROSCOPY OPERATIVE;  Surgeon: Tereso Newcomer, MD;  Location: [facility];  Service: Gynecology;  Laterality: N/A;  Operative laparoscopy right salpingectomy and removal of ectopic pregnancy    No family history on file.  History  Substance Use Topics  . Smoking status: Former Games developer  . Smokeless tobacco: Not on file  . Alcohol Use: Yes     occasional   Allergies:  Allergies  Allergen Reactions  . Latex Hives  . Vioxx (Rofecoxib) Hives   Prescriptions prior to admission  Medication Sig Dispense Refill  . acetaminophen (TYLENOL) 500 MG tablet Take 500 mg by mouth every 6 (six) hours as needed. pain      . docusate sodium (COLACE) 100 MG capsule Take 1 capsule (100 mg total) by mouth 2 (two) times daily as needed for constipation.  30 capsule  2  . ibuprofen (ADVIL,MOTRIN) 600 MG tablet Take 1 tablet (600 mg total) by mouth every 6 (six) hours as needed for pain.  30 tablet  1  . oxyCODONE-acetaminophen (PERCOCET) 5-325 MG per tablet Take 1-2 tablets by mouth every 6 (six) hours as needed for pain.  30 tablet  0  . albuterol (PROVENTIL HFA;VENTOLIN HFA) 108 (90 BASE) MCG/ACT inhaler Inhale 2 puffs into the lungs every 6 (six) hours as needed. For shortness of breath       Review of Systems  Constitutional: Positive for fever (subjective only).  HENT: Negative for ear pain, sore throat, rhinorrhea and neck pain.   Respiratory:  Positive for shortness of breath. Negative for hemoptysis, sputum production and wheezing.   Cardiovascular: Positive for chest pain and leg swelling (bilateral). Negative for orthopnea, claudication, syncope and PND.  Gastrointestinal: Positive for abdominal pain. Negative for vomiting.  Skin: Negative for rash.  Neurological: Negative.  Negative for headaches.  Endo/Heme/Allergies: Negative.   Psychiatric/Behavioral: Negative.    Physical Exam   Blood pressure 131/77, pulse 73, temperature 98.1 F (36.7 C),  temperature source Oral, resp. rate 16, last menstrual period 04/23/2012, SpO2 100.00%, unknown if currently breastfeeding.  Physical Exam  Constitutional: She is oriented to person, place, and time. She appears well-nourished. She appears distressed (uncomfortable and conversational dyspnea).  HENT:  Head: Normocephalic and atraumatic.  Eyes: Conjunctivae are normal. Right eye exhibits no discharge. No scleral icterus.  Neck: No JVD present. No tracheal deviation present.  Cardiovascular: Normal rate, regular rhythm, normal heart sounds and intact distal pulses.  Exam reveals no gallop and no friction rub.   No murmur heard. Respiratory: She is in respiratory distress (1-2 word conversational dyspnea). She has no wheezes. She has no rales. She exhibits tenderness (anterior R Lower rib cage below breast).  GI: Soft. Bowel sounds are normal. She exhibits no distension and no mass. There is tenderness. There is no rebound and no guarding.  Musculoskeletal:       Minimal R>L swelling; minimal erythema; no cords; negative homans'; negative Braggard stretch  Lymphadenopathy:    She has no cervical adenopathy.  Neurological: She is alert and oriented to person, place, and time. She exhibits normal muscle tone.  Skin: Skin is warm and dry. No rash noted. She is not diaphoretic. No erythema. No pallor.  Psychiatric: She has a normal mood and affect. Her behavior is normal. Judgment and thought content normal.    MAU Course  Procedures Results for orders placed during the hospital encounter of 06/02/12 (from the past 24 hour(s))  CBC     Status: Abnormal   Collection Time   06/02/12 12:38 PM      Component Value Range   WBC 6.1  4.0 - 10.5 (K/uL)   RBC 3.45 (*) 3.87 - 5.11 (MIL/uL)   Hemoglobin 10.6 (*) 12.0 - 15.0 (g/dL)   HCT 16.1 (*) 09.6 - 46.0 (%)   MCV 91.0  78.0 - 100.0 (fL)   MCH 30.7  26.0 - 34.0 (pg)   MCHC 33.8  30.0 - 36.0 (g/dL)   RDW 04.5  40.9 - 81.1 (%)   Platelets 226  150  - 400 (K/uL)    Dg Chest 2 View  06/02/2012  *RADIOLOGY REPORT*  Clinical Data: Shortness of breath.  Asthma.  Postop day #4 from ectopic pregnancy.  CHEST - 2 VIEW  Comparison: 02/06/2012  Findings: A small amount of free air is seen beneath the right hemidiaphragm, consistent with recent surgery.  Both lungs are clear.  Heart size mediastinal contours are normal.  IMPRESSION:  1.  No active cardiopulmonary disease. 2.  Small amount of postop free air noted beneath the right hemidiaphragm.  Original Report Authenticated By: Danae Orleans, M.D.   Ct Angio Chest W/cm &/or Wo Cm  06/02/2012  *RADIOLOGY REPORT*  Clinical Data: Short of breath.  Postop day #4 from ectopic pregnancy.  CT ANGIOGRAPHY CHEST  Technique:  Multidetector CT imaging of the chest using the standard protocol during bolus administration of intravenous contrast. Multiplanar reconstructed images including MIPs were obtained and reviewed to evaluate the vascular anatomy.  Contrast: OMNIPAQUE IOHEXOL  300 MG/ML  SOLN  Comparison: None.  Findings: Satisfactory opacification of the pulmonary arteries noted, and there is no evidence of pulmonary emboli.  No evidence of thoracic aortic aneurysm or dissection.  No evidence of mediastinal hematoma or mass.  No lymphadenopathy identified.  Tiny bilateral pleural effusions are seen with mild dependent atelectasis.  No evidence of pulmonary consolidation or mass. Central tracheobronchial airways are patent.  Small bowel postop free air noted in the upper abdomen.  IMPRESSION:  1.  No evidence of pulmonary embolism. 2. Tiny bilateral pleural effusions and mild dependent atelectasis.  Original Report Authenticated By: Danae Orleans, M.D.     MDM Given recent surgery concern for PNA vs VTE/PE.  No hypoxia, hypotension/hypertension.  Afebrile +Family History of DVT (grandmother); recent surgery; recent smoker (1 month) - No indication for D-Dimer; CT Negative Chest X-Ray + for pneumoperitonem  but negative for cardiopulmonary process Basic lab work 2 point drop in Hb; no leukocytosis EKG; no ST changes; no signs of ischemia; no signs of R heart strain  Assessment and Plan  Post Op pain due likely due to persistent iatrogenic pneumomediastinum.   No evidence of Post-Op Pneumonia or other pulmonary process. Will treat pain with hydrocodone as pt reports this works better for her.  Pneumoperitoneum s/p laparoscopic surgery  Case discussed with Dr. Vladimir Faster, DO [facility] Resident - PGY-1 06/02/2012 2:37 PM  Patient seen and examined.  Agree with above note.  Levie Heritage, DO 06/02/2012 5:02 PM

## 2012-06-04 NOTE — MAU Provider Note (Signed)
Initial plan for [place] was discussed with me at 8 a.m..  Dr. Debroah Loop assumed care at 8:15 and coordinated care with midlevel after [place] results.

## 2012-06-09 ENCOUNTER — Encounter (HOSPITAL_COMMUNITY): Payer: Self-pay | Admitting: *Deleted

## 2012-06-11 ENCOUNTER — Encounter: Payer: Self-pay | Admitting: Obstetrics & Gynecology

## 2012-06-29 ENCOUNTER — Ambulatory Visit: Payer: BC Managed Care – PPO | Admitting: Obstetrics & Gynecology

## 2012-10-16 ENCOUNTER — Encounter (HOSPITAL_COMMUNITY): Payer: Self-pay | Admitting: *Deleted

## 2012-10-16 ENCOUNTER — Emergency Department (HOSPITAL_COMMUNITY)
Admission: EM | Admit: 2012-10-16 | Discharge: 2012-10-16 | Disposition: A | Discharge status: Home / Self Care | Payer: Medicaid Other | Attending: Emergency Medicine | Admitting: Emergency Medicine

## 2012-10-16 DIAGNOSIS — Z79899 Other long term (current) drug therapy: Secondary | ICD-10-CM | POA: Insufficient documentation

## 2012-10-16 DIAGNOSIS — K589 Irritable bowel syndrome without diarrhea: Secondary | ICD-10-CM | POA: Insufficient documentation

## 2012-10-16 DIAGNOSIS — Z87891 Personal history of nicotine dependence: Secondary | ICD-10-CM | POA: Insufficient documentation

## 2012-10-16 DIAGNOSIS — B86 Scabies: Secondary | ICD-10-CM | POA: Insufficient documentation

## 2012-10-16 DIAGNOSIS — J45909 Unspecified asthma, uncomplicated: Secondary | ICD-10-CM | POA: Insufficient documentation

## 2012-10-16 MED ORDER — PERMETHRIN 5 % EX CREA: TOPICAL_CREAM | CUTANEOUS | Status: DC

## 2012-10-16 NOTE — ED Provider Notes (Signed)
History     CSN: [id number]  Arrival date & time 10/16/12  1327   First MD Initiated Contact with Patient 10/16/12 1443      Chief Complaint  Patient presents with  . exposure to scabies     (Consider location/radiation/quality/duration/timing/severity/associated sxs/prior treatment) HPI Pt presents with itchy rash over bilateral forearms.  She states her youngest child was diagnosed with scabies last night in the ED.  She is wanting to be treated as well and has brought her 3 other children in as well.  No difficulty breathing, no fever/cough.  No new exposures.  There are no other associated systemic symptoms, there are no other alleviating or modifying factors.   Past Medical History  Diagnosis Date  . Irritable bowel   . Asthma     meds as needed    Past Surgical History  Procedure Date  . Wisdom tooth extraction   . Tubal ligation 2005  . Laparoscopy 05/30/2012    Procedure: LAPAROSCOPY OPERATIVE;  Surgeon: Tereso Newcomer, MD;  Location: [facility];  Service: Gynecology;  Laterality: N/A;  Operative laparoscopy right salpingectomy and removal of ectopic pregnancy    No family history on file.  History  Substance Use Topics  . Smoking status: Former Games developer  . Smokeless tobacco: Not on file  . Alcohol Use: Yes     occasional    OB History    Grav Para Term Preterm Abortions TAB SAB Ect Mult Living   6 3 1 2 2  2   3       Review of Systems ROS reviewed and all otherwise negative except for mentioned in HPI  Allergies  Latex and Vioxx  Home Medications   Current Outpatient Rx  Name Route Sig Dispense Refill  . ACETAMINOPHEN 500 MG PO TABS Oral Take 500 mg by mouth every 6 (six) hours as needed. pain    . ALBUTEROL SULFATE HFA 108 (90 BASE) MCG/ACT IN AERS Inhalation Inhale 2 puffs into the lungs every 6 (six) hours as needed. For shortness of breath    . PERMETHRIN 5 % EX CREA  Apply to affected area once as directed, may repeat x 1 60 g 0    BP 142/77   Pulse 66  Temp 97.8 F (36.6 C) (Oral)  Resp 16  Wt 178 lb (80.74 kg)  SpO2 98%  Breastfeeding? Unknown Vitals reviewed Physical Exam Physical Examination: General appearance - alert, well appearing, and in no distress Mental status - alert, oriented to person, place, and time Eyes - no conjunctival injection, no scleral icterus Mouth - mucous membranes moist, pharynx normal without lesions Extremities - peripheral pulses normal, no pedal edema, no clubbing or cyanosis Skin - normal coloration and turgor, no rashes, no suspicious skin lesions noted  ED Course  Procedures (including critical care time)  Labs Reviewed - No data to display No results found.   1. Scabies       MDM  Pt presenting with exposure to scabies and itchy rash over her arms.  Pt given rx for permethrin.  Discharged with strict return precautions.  Pt agreeable with plan.        Ethelda Chick, MD 10/17/12 [phone]

## 2012-10-16 NOTE — ED Notes (Signed)
Pt denies any pain.  Pt's respirations are equal and non labored. 

## 2012-10-16 NOTE — ED Notes (Signed)
BIB guardian.  Other family members evaluated here yesterday and Dx with scabies.  Family member wants pt treated too.  No visible rash;  No reports of itching

## 2012-11-05 ENCOUNTER — Inpatient Hospital Stay (HOSPITAL_COMMUNITY)
Admission: AD | Admit: 2012-11-05 | Discharge: 2012-11-05 | Disposition: A | Discharge status: Home / Self Care | Payer: Medicaid Other | Source: Ambulatory Visit | Attending: Family Medicine | Admitting: Family Medicine

## 2012-11-05 ENCOUNTER — Encounter (HOSPITAL_COMMUNITY): Payer: Self-pay

## 2012-11-05 DIAGNOSIS — R112 Nausea with vomiting, unspecified: Secondary | ICD-10-CM

## 2012-11-05 DIAGNOSIS — N926 Irregular menstruation, unspecified: Secondary | ICD-10-CM

## 2012-11-05 LAB — URINALYSIS, ROUTINE W REFLEX MICROSCOPIC
Glucose, UA: NEGATIVE mg/dL
Ketones, ur: NEGATIVE mg/dL
Leukocytes, UA: NEGATIVE
pH: 8 (ref 5.0–8.0)

## 2012-11-05 LAB — WET PREP, GENITAL
Trich, Wet Prep: NONE SEEN
Yeast Wet Prep HPF POC: NONE SEEN

## 2012-11-05 LAB — POCT PREGNANCY, URINE: Preg Test, Ur: NEGATIVE

## 2012-11-05 NOTE — MAU Provider Note (Signed)
Chart reviewed and agree with management and plan.  

## 2012-11-05 NOTE — MAU Note (Signed)
Patient is in with c/o n/v for 4 days. She states that she had light period nov 1-4th. Then again 8 through the 11th. She denies and any vaginal bleeding today. She states that she had bilateral tubal ligation. She had an ectopic in June 2013 with right fallopian tube removal.

## 2012-11-05 NOTE — MAU Provider Note (Signed)
History     CSN: [id number]  Arrival date and time: 11/05/12 1302   First Provider Initiated Contact with Patient 11/05/12 1352      Chief Complaint  Patient presents with  . Abdominal Pain  . Nausea  . Emesis   HPI [name] is 31 y.o. [id number] Unknown weeks presenting with last 2 menstrual cycles have been irregular.  LMP 10/26/12, short 3-4 days.  Had Neg UPT at home. She also has nausea and vomiting X 3-4 days.   Abdominal discomfort that she says is "bubbly like gas, cramping".  Worried about another ectopic. Hx of BTL 2005.  She had a + UPT in June with dx of ectopic on the right.  She had surgery to remove pregnancy by Dr. Macon Large on June 8.  Has done well since then.  Has appt in the [facility] CL[facility] on 11/21 for follow up.  1 Sexual partner.      Past Medical History  Diagnosis Date  . Irritable bowel   . Asthma     meds as needed    Past Surgical History  Procedure Date  . Wisdom tooth extraction   . Tubal ligation 2005  . Laparoscopy 05/30/2012    Procedure: LAPAROSCOPY OPERATIVE;  Surgeon: Tereso Newcomer, MD;  Location: [facility];  Service: Gynecology;  Laterality: N/A;  Operative laparoscopy right salpingectomy and removal of ectopic pregnancy  . Salpingectomy     History reviewed. No pertinent family history.  History  Substance Use Topics  . Smoking status: Former Games developer  . Smokeless tobacco: Not on file  . Alcohol Use: Yes     Comment: occasional    Allergies:  Allergies  Allergen Reactions  . Latex Hives  . Vioxx (Rofecoxib) Hives    Prescriptions prior to admission  Medication Sig Dispense Refill  . calcium carbonate (TUMS - DOSED IN MG ELEMENTAL CALCIUM) 500 MG chewable tablet Chew 1 tablet by mouth daily. For acid reflux.      Marland Kitchen HYDROcodone-acetaminophen (VICODIN) 5-500 MG per tablet Take 1 tablet by mouth every 6 (six) hours as needed. For toothache.      Marland Kitchen PENICILLIN V POTASSIUM PO Take 4 tablets by mouth daily. Pt took 4 tablets daily  for four days ending on or about 11/03/12.      Marland Kitchen albuterol (PROVENTIL HFA;VENTOLIN HFA) 108 (90 BASE) MCG/ACT inhaler Inhale 2 puffs into the lungs every 6 (six) hours as needed. For shortness of breath        Review of Systems  Constitutional: Negative.   HENT: Negative.   Respiratory: Negative.   Cardiovascular: Negative.   Gastrointestinal: Positive for nausea, vomiting and abdominal pain.  Genitourinary: Negative for dysuria, urgency and frequency.       Negative for vaginal bleeding or abnormal discharge  Neurological: Dizziness: mild cramping.   Physical Exam   Blood pressure 141/99, pulse 82, temperature 97.7 F (36.5 C), temperature source Oral, resp. rate 18, height 5\' 5"  (1.651 m), weight 181 lb 2 oz (82.158 kg), last menstrual period 10/30/2012, not currently breastfeeding.  Physical Exam  Constitutional: She is oriented to person, place, and time. She appears well-developed and well-nourished. No distress.  HENT:  Head: Normocephalic.  Neck: Normal range of motion.  Cardiovascular: Normal rate.   Respiratory: Effort normal.  GI: Soft. She exhibits no distension and no mass. There is no tenderness. There is no rebound and no guarding.  Genitourinary: There is no rash, tenderness or lesion on the right labia. There  is no rash, tenderness or lesion on the left labia. Uterus is not enlarged and not tender. Cervix exhibits no motion tenderness and no friability. Right adnexum displays no mass, no tenderness and no fullness. Left adnexum displays no mass, no tenderness and no fullness. No erythema, tenderness or bleeding around the vagina. No vaginal discharge found.  Neurological: She is alert and oriented to person, place, and time.  Skin: Skin is warm and dry.  Psychiatric: She has a normal mood and affect. Her behavior is normal.   Results for orders placed during the hospital encounter of 11/05/12 (from the past 24 hour(s))  URINALYSIS, ROUTINE W REFLEX MICROSCOPIC      Status: Normal   Collection Time   11/05/12  1:15 PM      Component Value Range   Color, Urine YELLOW  YELLOW   APPearance CLEAR  CLEAR   Specific Gravity, Urine 1.015  1.005 - 1.030   pH 8.0  5.0 - 8.0   Glucose, UA NEGATIVE  NEGATIVE mg/dL   Hgb urine dipstick NEGATIVE  NEGATIVE   Bilirubin Urine NEGATIVE  NEGATIVE   Ketones, ur NEGATIVE  NEGATIVE mg/dL   Protein, ur NEGATIVE  NEGATIVE mg/dL   Urobilinogen, UA 0.2  0.0 - 1.0 mg/dL   Nitrite NEGATIVE  NEGATIVE   Leukocytes, UA NEGATIVE  NEGATIVE  POCT PREGNANCY, URINE     Status: Normal   Collection Time   11/05/12  1:18 PM      Component Value Range   Preg Test, Ur NEGATIVE  NEGATIVE  HCG, QUANTITATIVE, PREGNANCY     Status: Normal   Collection Time   11/05/12  1:53 PM      Component Value Range   hCG, Beta Chain, Quant, S <1  <5 mIU/mL  WET PREP, GENITAL     Status: Abnormal   Collection Time   11/05/12  2:45 PM      Component Value Range   Yeast Wet Prep HPF POC NONE SEEN  NONE SEEN   Trich, Wet Prep NONE SEEN  NONE SEEN   Clue Cells Wet Prep HPF POC NONE SEEN  NONE SEEN   WBC, Wet Prep HPF POC FEW (*) NONE SEEN   MAU Course  Procedures  GC/CHL culture to lab  MDM  Patient has not vomiting during visit  Assessment and Plan  A:  Abnormal menstrual cycles X 2 months      Hx of ruptured ectopic on the right 05/2012     Hx of Bilateral tubal ligation 2005     Negative UPT and BHCG <1  P:  Reassured patient she is not pregnant      Keep appointment for 11/21 in the clinic with Dr. Scherrie November 11/05/2012, 3:21 PM

## 2012-11-06 LAB — GC/CHLAMYDIA PROBE AMP, GENITAL: GC Probe Amp, Genital: NEGATIVE

## 2012-11-12 ENCOUNTER — Ambulatory Visit: Payer: Medicaid Other | Admitting: Obstetrics & Gynecology

## 2012-11-12 ENCOUNTER — Emergency Department (HOSPITAL_COMMUNITY)
Admission: EM | Admit: 2012-11-12 | Discharge: 2012-11-12 | Disposition: A | Discharge status: Home / Self Care | Payer: Medicaid Other | Attending: Emergency Medicine | Admitting: Emergency Medicine

## 2012-11-12 ENCOUNTER — Encounter (HOSPITAL_COMMUNITY): Payer: Self-pay | Admitting: Emergency Medicine

## 2012-11-12 DIAGNOSIS — Z79899 Other long term (current) drug therapy: Secondary | ICD-10-CM | POA: Insufficient documentation

## 2012-11-12 DIAGNOSIS — R112 Nausea with vomiting, unspecified: Secondary | ICD-10-CM | POA: Insufficient documentation

## 2012-11-12 DIAGNOSIS — J45909 Unspecified asthma, uncomplicated: Secondary | ICD-10-CM | POA: Insufficient documentation

## 2012-11-12 DIAGNOSIS — R109 Unspecified abdominal pain: Secondary | ICD-10-CM

## 2012-11-12 DIAGNOSIS — R197 Diarrhea, unspecified: Secondary | ICD-10-CM | POA: Insufficient documentation

## 2012-11-12 DIAGNOSIS — Z8719 Personal history of other diseases of the digestive system: Secondary | ICD-10-CM | POA: Insufficient documentation

## 2012-11-12 LAB — URINALYSIS, ROUTINE W REFLEX MICROSCOPIC
Bilirubin Urine: NEGATIVE
Leukocytes, UA: NEGATIVE
Nitrite: NEGATIVE
Specific Gravity, Urine: 1.007 (ref 1.005–1.030)
pH: 6 (ref 5.0–8.0)

## 2012-11-12 LAB — CBC WITH DIFFERENTIAL/PLATELET
Basophils Relative: 0 % (ref 0–1)
Eosinophils Absolute: 0.2 10*3/uL (ref 0.0–0.7)
Eosinophils Relative: 2 % (ref 0–5)
HCT: 44.3 % (ref 36.0–46.0)
Hemoglobin: 15.1 g/dL — ABNORMAL HIGH (ref 12.0–15.0)
MCH: 30.4 pg (ref 26.0–34.0)
MCHC: 34.1 g/dL (ref 30.0–36.0)
MCV: 89.1 fL (ref 78.0–100.0)
Monocytes Absolute: 0.6 10*3/uL (ref 0.1–1.0)
Monocytes Relative: 7 % (ref 3–12)

## 2012-11-12 LAB — PREGNANCY, URINE: Preg Test, Ur: NEGATIVE

## 2012-11-12 LAB — BASIC METABOLIC PANEL
BUN: 6 mg/dL (ref 6–23)
Creatinine, Ser: 0.61 mg/dL (ref 0.50–1.10)
GFR calc Af Amer: >90 mL/min (ref >90)
GFR calc non Af Amer: >90 mL/min (ref >90)

## 2012-11-12 MED ORDER — DIPHENHYDRAMINE HCL 50 MG/ML IJ SOLN
INTRAMUSCULAR | Status: AC
  Administered 2012-11-12: 25 mg via INTRAVENOUS
  Filled 2012-11-12: qty 1

## 2012-11-12 MED ORDER — DICYCLOMINE HCL 20 MG PO TABS: 20.0000 mg | ORAL_TABLET | Freq: Two times a day (BID) | ORAL | Status: DC

## 2012-11-12 MED ORDER — FENTANYL CITRATE 0.05 MG/ML IJ SOLN
50.0000 ug | Freq: Once | INTRAMUSCULAR | Status: AC
  Administered 2012-11-12: 50 ug via INTRAVENOUS
  Filled 2012-11-12: qty 2

## 2012-11-12 MED ORDER — SODIUM CHLORIDE 0.9 % IV SOLN
1000.0000 mL | Freq: Once | INTRAVENOUS | Status: AC
Start: 2012-11-12 — End: 2012-11-12
  Administered 2012-11-12: 1000 mL via INTRAVENOUS

## 2012-11-12 MED ORDER — ONDANSETRON HCL 4 MG/2ML IJ SOLN
4.0000 mg | Freq: Once | INTRAMUSCULAR | Status: AC
  Administered 2012-11-12: 4 mg via INTRAVENOUS
  Filled 2012-11-12: qty 2

## 2012-11-12 MED ORDER — ONDANSETRON 4 MG PO TBDP: 4.0000 mg | ORAL_TABLET | Freq: Three times a day (TID) | ORAL | Status: DC | PRN

## 2012-11-12 MED ORDER — GI COCKTAIL ~~LOC~~
30.0000 mL | Freq: Once | ORAL | Status: AC
  Administered 2012-11-12: 30 mL via ORAL
  Filled 2012-11-12: qty 30

## 2012-11-12 MED ORDER — DIPHENHYDRAMINE HCL 50 MG/ML IJ SOLN
25.0000 mg | Freq: Once | INTRAMUSCULAR | Status: AC
  Administered 2012-11-12: 25 mg via INTRAVENOUS

## 2012-11-12 MED ORDER — FENTANYL CITRATE 0.05 MG/ML IJ SOLN
INTRAMUSCULAR | Status: AC
  Administered 2012-11-12: 50 ug via INTRAVENOUS
  Filled 2012-11-12: qty 2

## 2012-11-12 MED ORDER — DICYCLOMINE HCL 10 MG/ML IM SOLN
20.0000 mg | Freq: Once | INTRAMUSCULAR | Status: AC
  Administered 2012-11-12: 20 mg via INTRAMUSCULAR
  Filled 2012-11-12: qty 2

## 2012-11-12 MED ORDER — FENTANYL CITRATE 0.05 MG/ML IJ SOLN
50.0000 ug | Freq: Once | INTRAMUSCULAR | Status: AC
  Administered 2012-11-12: 50 ug via INTRAVENOUS

## 2012-11-12 NOTE — ED Provider Notes (Signed)
History     CSN: [id number]  Arrival date & time 11/12/12  1649   First MD Initiated Contact with Patient 11/12/12 1702      Chief Complaint  Patient presents with  . Abdominal Pain    (Consider location/radiation/quality/duration/timing/severity/associated sxs/prior treatment) HPI   The patient presents with concerns of abdominal pain, nausea, vomiting, diarrhea.  She states her symptoms began today, approximately 10 hours ago, present upon awakening.  Since onset she has had crampy upper abdominal pain.  The pain is otherwise nonradiating.  There is associated nausea, with innumerable episodes of diarrhea, and some vomiting.  She notes chills, fever, lightheadedness, chest pain, dyspnea.  She denies lower abdominal pain, vaginal bleeding or discharge, dysuria, hematuria, lower extremity edema.  Notably, the patient has a history of irritable bowel, as well as a prior ectopic pregnancy, treated with laparoscopy and tube ligation.  The patient has one family member who has a somewhat similar illness, her son. Past Medical History  Diagnosis Date  . Irritable bowel   . Asthma     meds as needed    Past Surgical History  Procedure Date  . Wisdom tooth extraction   . Tubal ligation 2005  . Laparoscopy 05/30/2012    Procedure: LAPAROSCOPY OPERATIVE;  Surgeon: Tereso Newcomer, MD;  Location: [facility];  Service: Gynecology;  Laterality: N/A;  Operative laparoscopy right salpingectomy and removal of ectopic pregnancy  . Salpingectomy     History reviewed. No pertinent family history.  History  Substance Use Topics  . Smoking status: Former Games developer  . Smokeless tobacco: Not on file  . Alcohol Use: Yes     Comment: occasional    OB History    Grav Para Term Preterm Abortions TAB SAB Ect Mult Living   5 3 1 2 2  1 1  3       Review of Systems  Constitutional:       Per HPI, otherwise negative  HENT:       Per HPI, otherwise negative  Eyes: Negative.   Respiratory:   Per HPI, otherwise negative  Cardiovascular:       Per HPI, otherwise negative  Gastrointestinal: Positive for nausea, vomiting and diarrhea.  Genitourinary: Negative.   Musculoskeletal:       Per HPI, otherwise negative  Skin: Negative.   Neurological: Negative for syncope.    Allergies  Hydrocodeine; Latex; and Vioxx  Home Medications   Current Outpatient Rx  Name  Route  Sig  Dispense  Refill  . ALBUTEROL SULFATE HFA 108 (90 BASE) MCG/ACT IN AERS   Inhalation   Inhale 2 puffs into the lungs every 6 (six) hours as needed. For shortness of breath         . CALCIUM CARBONATE ANTACID 500 MG PO CHEW   Oral   Chew 2 tablets by mouth daily as needed. For acid reflux.         Marland Kitchen HYDROCODONE-ACETAMINOPHEN 5-500 MG PO TABS   Oral   Take 1 tablet by mouth every 6 (six) hours as needed. For toothache.           BP 150/123  Pulse 109  Temp 98.3 F (36.8 C) (Oral)  Resp 20  SpO2 98%  LMP 10/30/2012  Physical Exam  Nursing note and vitals reviewed. Constitutional: She is oriented to person, place, and time. She appears well-developed and well-nourished. No distress.  HENT:  Head: Normocephalic and atraumatic.  Eyes: Conjunctivae normal and EOM are normal.  Cardiovascular: Normal rate and regular rhythm.   Pulmonary/Chest: Effort normal and breath sounds normal. No stridor. No respiratory distress.  Abdominal: Normal appearance and bowel sounds are normal. She exhibits no distension. There is tenderness in the epigastric area and periumbilical area. There is no rigidity, no rebound, no guarding, no tenderness at McBurney's point and negative Murphy's sign.  Musculoskeletal: She exhibits no edema.  Neurological: She is alert and oriented to person, place, and time. No cranial nerve deficit.  Skin: Skin is warm and dry.  Psychiatric: She has a normal mood and affect.    ED Course  Procedures (including critical care time)   Labs Reviewed  CBC WITH DIFFERENTIAL    BASIC METABOLIC PANEL  LIPASE, BLOOD  URINALYSIS, ROUTINE W REFLEX MICROSCOPIC  PREGNANCY, URINE   No results found.   No diagnosis found.  6:26 PM Patient feeling better.  She has mild erythema and hives, but remains in pain.   2030: Patient sleeping. MDM  This patient presents with concerns of abdominal pain, nausea, vomiting, diarrhea.  Notably, the patient has a family member with a similar illness suggestive of infectious etiology.  Initially the patient has mild tenderness to her abdomen, is tachycardic, uncomfortable appearing.  She improved substantially with fluids, analgesics, antiemetics.  Patient's labs are reassuring, and given her resolution of symptoms she was discharged in stable condition to follow up with her primary care physician.        Gerhard Munch, MD 11/12/12 2105

## 2012-11-12 NOTE — ED Notes (Signed)
Pt vomiting in triage 

## 2012-11-12 NOTE — ED Notes (Signed)
Pt c/o upper abd pain with diarrhea starting today; pt tearful at present

## 2012-11-18 ENCOUNTER — Ambulatory Visit: Payer: Medicaid Other | Admitting: Obstetrics & Gynecology

## 2013-01-26 ENCOUNTER — Emergency Department (HOSPITAL_COMMUNITY): Payer: Medicaid Other

## 2013-01-26 ENCOUNTER — Emergency Department (HOSPITAL_COMMUNITY)
Admission: EM | Admit: 2013-01-26 | Discharge: 2013-01-27 | Discharge status: Against Medical Advice | Payer: Medicaid Other | Attending: Emergency Medicine | Admitting: Emergency Medicine

## 2013-01-26 DIAGNOSIS — R079 Chest pain, unspecified: Secondary | ICD-10-CM | POA: Insufficient documentation

## 2013-01-26 NOTE — ED Notes (Signed)
Pt not in room for assessment at this time 

## 2013-01-26 NOTE — ED Notes (Signed)
Pt c/o L sided chest pain since last night. Pt states today she felt a sharp pain down her left side. Pt states pain is intermittent and worse when she lies on her L side. Pain is also worse with inspiration and mvmt. Pt states she feels weak. Pt denies nausea, shortness of breath. Pt states she has felt some fluttering in her heart. Pt describes pain as sharp and stabbing and intermittent.

## 2013-01-27 NOTE — ED Notes (Signed)
Pt called at 2255 x4 to be taken back to acute room, no response.

## 2013-03-22 ENCOUNTER — Encounter: Payer: Self-pay | Admitting: *Deleted

## 2013-04-20 ENCOUNTER — Inpatient Hospital Stay (HOSPITAL_COMMUNITY)
Admission: AD | Admit: 2013-04-20 | Discharge: 2013-04-20 | Disposition: A | Discharge status: Home / Self Care | Payer: Medicaid Other | Source: Ambulatory Visit | Attending: Obstetrics & Gynecology | Admitting: Obstetrics & Gynecology

## 2013-04-20 ENCOUNTER — Encounter (HOSPITAL_COMMUNITY): Payer: Self-pay | Admitting: *Deleted

## 2013-04-20 DIAGNOSIS — N938 Other specified abnormal uterine and vaginal bleeding: Secondary | ICD-10-CM | POA: Insufficient documentation

## 2013-04-20 DIAGNOSIS — R109 Unspecified abdominal pain: Secondary | ICD-10-CM | POA: Insufficient documentation

## 2013-04-20 DIAGNOSIS — B9689 Other specified bacterial agents as the cause of diseases classified elsewhere: Secondary | ICD-10-CM | POA: Insufficient documentation

## 2013-04-20 DIAGNOSIS — N760 Acute vaginitis: Secondary | ICD-10-CM | POA: Insufficient documentation

## 2013-04-20 DIAGNOSIS — A499 Bacterial infection, unspecified: Secondary | ICD-10-CM | POA: Insufficient documentation

## 2013-04-20 DIAGNOSIS — N949 Unspecified condition associated with female genital organs and menstrual cycle: Secondary | ICD-10-CM | POA: Insufficient documentation

## 2013-04-20 HISTORY — DX: Unspecified ectopic pregnancy without intrauterine pregnancy: O00.90

## 2013-04-20 LAB — URINALYSIS, ROUTINE W REFLEX MICROSCOPIC
Leukocytes, UA: NEGATIVE
Nitrite: NEGATIVE
Protein, ur: NEGATIVE mg/dL
Urobilinogen, UA: 0.2 mg/dL (ref 0.0–1.0)

## 2013-04-20 LAB — POCT PREGNANCY, URINE: Preg Test, Ur: NEGATIVE

## 2013-04-20 MED ORDER — METRONIDAZOLE 500 MG PO TABS: 500.0000 mg | ORAL_TABLET | Freq: Two times a day (BID) | ORAL | Status: DC

## 2013-04-20 MED ORDER — METRONIDAZOLE 500 MG PO TABS: 500.0000 mg | ORAL_TABLET | Freq: Three times a day (TID) | ORAL | Status: DC

## 2013-04-20 NOTE — MAU Provider Note (Signed)
Attestation of Attending Supervision of Advanced Practitioner (PA/CNM/NP): Evaluation and management procedures were performed by the Advanced Practitioner under my supervision and collaboration.  I have reviewed the Advanced Practitioner's note and chart, and I agree with the management and plan.  Shaune Malacara, MD, FACOG Attending Obstetrician & Gynecologist Faculty Practice, [facility]  

## 2013-04-20 NOTE — MAU Note (Signed)
Patient states she had an ectopic last year and is having similar symptoms. States she started having lower abdominal, back and rectal pain that started last night. Started light bleeding this am.

## 2013-04-20 NOTE — Progress Notes (Signed)
Patient informed  UPT negative, but states she still feels some pain on that side. Still worried about becoming pregnant even though 1 tube removed and other is ligated.Marland Kitchen

## 2013-04-20 NOTE — MAU Provider Note (Signed)
CC: Possible Pregnancy, Abdominal Pain and Vaginal Bleeding    First Provider Initiated Contact with Patient 04/20/13 1313      HPI [name] is a 32 y.o. [id number] who presents with light pink spotting once at 1100, not enough to wear a pad and not postcoital. Associated with diffuse lower abdominal crampy pain. Pain on right unchanged since having right salpingectomy last year, left-sided cramping is new. Reports increase in vaginal discharge which is at times malodorous, not pruritic. Concerned she had similar sx with ectopic last year and may have an ectopic. LMP 04/08/13. Contraception: tubal ligation. No new sexual partner but unsure about partner.   Had similar abnormal bleeding once in Jan 2014 when she bled twice in the same month.  PMD Dr. Charlsie Quest: plans to schedule soon and get Pap.  ROS negative for irritative vaginal discharge, back pain, dysuria, urgency or frequency of urination, nausea, vomiting, diarrhea, constipation.  Past Medical History  Diagnosis Date  . Irritable bowel   . Asthma     meds as needed  . Ectopic pregnancy     requiring surgery    OB History   Grav Para Term Preterm Abortions TAB SAB Ect Mult Living   5 3 1 2 2  1 1  3      # Outc Date GA Lbr Len/2nd Wgt Sex Del Anes PTL Lv   1 TRM            2 PRE            3 PRE            4 SAB            5 ECT               Past Surgical History  Procedure Laterality Date  . Wisdom tooth extraction    . Tubal ligation  2005  . Laparoscopy  05/30/2012    Procedure: LAPAROSCOPY OPERATIVE;  Surgeon: Tereso Newcomer, MD;  Location: [facility];  Service: Gynecology;  Laterality: N/A;  Operative laparoscopy right salpingectomy and removal of ectopic pregnancy  . Salpingectomy    . Laparoscopy for ectopic pregnancy      History   Social History  . Marital Status: Married    Spouse Name: N/A    Number of Children: N/A  . Years of Education: N/A   Occupational History  . Not on file.   Social  History Main Topics  . Smoking status: Former Games developer  . Smokeless tobacco: Not on file  . Alcohol Use: Yes     Comment: occasional  . Drug Use: No  . Sexually Active: Yes    Birth Control/ Protection: Surgical   Other Topics Concern  . Not on file   Social History Narrative  . No narrative on file    No current facility-administered medications on file prior to encounter.   No current outpatient prescriptions on file prior to encounter.    Allergies  Allergen Reactions  . Hydrocodeine (Dihydrocodeine) Hives  . Latex Hives  . Vioxx (Rofecoxib) Hives    ROS Pertinent items in HPI  PHYSICAL EXAM Filed Vitals:   04/20/13 1235  BP: 118/88  Pulse: 79  Temp: 97.9 F (36.6 C)  Resp: 16   General: Well nourished, well developed female in no acute distress Cardiovascular: Normal rate Respiratory: Normal effort Abdomen: Soft, mild, diffuse tenderness left and right lower abdomen with no guarding or rebound Back: No CVAT Extremities: No  edema Neurologic: Alert and oriented Speculum exam: NEFG; vagina with moderate gray discharge, no blood; cervix clean Bimanual exam: cervix closed, no CMT; uterus NSSP; no adnexal tenderness or masses  LAB RESULTS Results for orders placed during the hospital encounter of 04/20/13 (from the past 24 hour(s))  URINALYSIS, ROUTINE W REFLEX MICROSCOPIC     Status: None   Collection Time    04/20/13 12:40 PM      Result Value Range   Color, Urine YELLOW  YELLOW   APPearance CLEAR  CLEAR   Specific Gravity, Urine 1.010  1.005 - 1.030   pH 5.5  5.0 - 8.0   Glucose, UA NEGATIVE  NEGATIVE mg/dL   Hgb urine dipstick NEGATIVE  NEGATIVE   Bilirubin Urine NEGATIVE  NEGATIVE   Ketones, ur NEGATIVE  NEGATIVE mg/dL   Protein, ur NEGATIVE  NEGATIVE mg/dL   Urobilinogen, UA 0.2  0.0 - 1.0 mg/dL   Nitrite NEGATIVE  NEGATIVE   Leukocytes, UA NEGATIVE  NEGATIVE  POCT PREGNANCY, URINE     Status: None   Collection Time    04/20/13 12:57 PM       Result Value Range   Preg Test, Ur NEGATIVE  NEGATIVE    ASSESSMENT  1. Abdominal  pain, other specified site   2. BV (bacterial vaginosis)     PLAN GC/CT sent Reassured no pregnnency Discharge home. See AVS for patient education.    Medication List    TAKE these medications       albuterol 108 (90 BASE) MCG/ACT inhaler  Commonly known as:  PROVENTIL HFA;VENTOLIN HFA  Inhale 2 puffs into the lungs every 6 (six) hours as needed for wheezing.     metroNIDAZOLE 500 MG tablet  Commonly known as:  FLAGYL  Take 1 tablet (500 mg total) by mouth 2 (two) times daily before a meal.          Danae Orleans, CNM 04/20/2013 1:25 PM

## 2013-04-21 LAB — GC/CHLAMYDIA PROBE AMP: GC Probe RNA: NEGATIVE

## 2013-06-08 ENCOUNTER — Inpatient Hospital Stay (HOSPITAL_COMMUNITY): Payer: Medicaid Other

## 2013-06-08 ENCOUNTER — Inpatient Hospital Stay (HOSPITAL_COMMUNITY)
Admission: AD | Admit: 2013-06-08 | Discharge: 2013-06-08 | Disposition: A | Discharge status: Home / Self Care | Payer: Medicaid Other | Source: Ambulatory Visit | Attending: Family Medicine | Admitting: Family Medicine

## 2013-06-08 ENCOUNTER — Encounter (HOSPITAL_COMMUNITY): Payer: Self-pay | Admitting: *Deleted

## 2013-06-08 DIAGNOSIS — Z9851 Tubal ligation status: Secondary | ICD-10-CM | POA: Insufficient documentation

## 2013-06-08 DIAGNOSIS — O26899 Other specified pregnancy related conditions, unspecified trimester: Secondary | ICD-10-CM

## 2013-06-08 DIAGNOSIS — Z3201 Encounter for pregnancy test, result positive: Secondary | ICD-10-CM | POA: Insufficient documentation

## 2013-06-08 DIAGNOSIS — R109 Unspecified abdominal pain: Secondary | ICD-10-CM

## 2013-06-08 DIAGNOSIS — R1032 Left lower quadrant pain: Secondary | ICD-10-CM | POA: Insufficient documentation

## 2013-06-08 DIAGNOSIS — O9989 Other specified diseases and conditions complicating pregnancy, childbirth and the puerperium: Secondary | ICD-10-CM

## 2013-06-08 HISTORY — DX: Gonococcal infection, unspecified: A54.9

## 2013-06-08 LAB — CBC
Hemoglobin: 13.2 g/dL (ref 12.0–15.0)
MCHC: 34.2 g/dL (ref 30.0–36.0)
Platelets: 258 10*3/uL (ref 150–400)
RBC: 4.39 MIL/uL (ref 3.87–5.11)

## 2013-06-08 LAB — URINALYSIS, ROUTINE W REFLEX MICROSCOPIC
Glucose, UA: NEGATIVE mg/dL
Leukocytes, UA: NEGATIVE
Nitrite: NEGATIVE
pH: 7.5 (ref 5.0–8.0)

## 2013-06-08 LAB — POCT PREGNANCY, URINE: Preg Test, Ur: POSITIVE — AB

## 2013-06-08 LAB — HCG, QUANTITATIVE, PREGNANCY: hCG, Beta Chain, Quant, S: 344 m[IU]/mL — ABNORMAL HIGH (ref <5)

## 2013-06-08 NOTE — MAU Provider Note (Signed)
History     CSN: [id number]  Arrival date and time: 06/08/13 1115   None     Chief Complaint  Patient presents with  . Possible Pregnancy  . Abdominal Pain   HPI Patient states [name] felt contractions and LLQ abdominal pain for the past 2 days. Due to previous history of ectopic pregnancy, [name] took 2 at-home pregnancy tests which were both positive prompting her visit to the MAU. [name] states the contractions have persisted intermittently throughout the day over this time. [name] endorses N/V and diarrhea for 48 hours, [name] also endorses "feeling very warm and sweaty for a minute or two," but denies fever or chills. [name] has not had any vaginal discharge or bleeding.   OB History   Grav Para Term Preterm Abortions TAB SAB Ect Mult Living   6 3 2 1 2  1 1  3       Past Medical History  Diagnosis Date  . Irritable bowel   . Asthma     meds as needed  . Ectopic pregnancy     requiring surgery  . Gonorrhea   . Preterm labor     Past Surgical History  Procedure Laterality Date  . Wisdom tooth extraction    . Tubal ligation  2005  . Laparoscopy  05/30/2012    Procedure: LAPAROSCOPY OPERATIVE;  Surgeon: Tereso Newcomer, MD;  Location: [facility];  Service: Gynecology;  Laterality: N/A;  Operative laparoscopy right salpingectomy and removal of ectopic pregnancy  . Salpingectomy    . Laparoscopy for ectopic pregnancy      History reviewed. No pertinent family history.  History  Substance Use Topics  . Smoking status: Former Games developer  . Smokeless tobacco: Never Used  . Alcohol Use: Yes     Comment: occasional    Allergies:  Allergies  Allergen Reactions  . Hydrocodeine (Dihydrocodeine) Hives  . Latex Hives  . Vioxx (Rofecoxib) Hives    Prescriptions prior to admission  Medication Sig Dispense Refill  . albuterol (PROVENTIL HFA;VENTOLIN HFA) 108 (90 BASE) MCG/ACT inhaler Inhale 2 puffs into the lungs every 6 (six) hours as needed for wheezing.        Review of Systems   Constitutional: Negative for fever, chills and weight loss.  Eyes: Negative for blurred vision.  Respiratory: Negative for cough.   Cardiovascular: Negative for chest pain and palpitations.  Gastrointestinal: Positive for nausea, vomiting, abdominal pain and diarrhea. Negative for constipation, blood in stool and melena.  Genitourinary: Positive for frequency. Negative for dysuria, urgency, hematuria and flank pain.  Musculoskeletal: Negative for myalgias.  Skin: Negative for itching and rash.  Neurological: Negative for dizziness and headaches.   Physical Exam   Blood pressure 136/88, pulse 88, temperature 98.9 F (37.2 C), temperature source Oral, resp. rate 18, height 5' 5.5" (1.664 m), weight 82.736 kg (182 lb 6.4 oz), last menstrual period 04/26/2013, SpO2 100.00%.  Physical Exam  Constitutional: [name] is oriented to person, place, and time. [name] appears well-developed and well-nourished.  HENT:  Head: Normocephalic and atraumatic.  Neck: Normal range of motion.  Cardiovascular: Normal rate, regular rhythm and intact distal pulses.  Exam reveals no gallop and no friction rub.   No murmur heard. Respiratory: Effort normal and breath sounds normal.  GI: Soft. Bowel sounds are normal. [name] exhibits no distension and no mass. There is tenderness (LLQ radiating to suprapubic). There is no rebound and no guarding.  Musculoskeletal: Normal range of motion.  Neurological: [name] is alert and oriented  to person, place, and time.  Skin: Skin is warm and dry.  Psychiatric: [name] has a normal mood and affect. Her behavior is normal.   Results for orders placed during the hospital encounter of 06/08/13 (from the past 24 hour(s))  URINALYSIS, ROUTINE W REFLEX MICROSCOPIC     Status: Abnormal   Collection Time    06/08/13 11:25 AM      Result Value Range   Color, Urine YELLOW  YELLOW   APPearance CLOUDY (*) CLEAR   Specific Gravity, Urine 1.015  1.005 - 1.030   pH 7.5  5.0 - 8.0   Glucose, UA  NEGATIVE  NEGATIVE mg/dL   Hgb urine dipstick NEGATIVE  NEGATIVE   Bilirubin Urine NEGATIVE  NEGATIVE   Ketones, ur NEGATIVE  NEGATIVE mg/dL   Protein, ur NEGATIVE  NEGATIVE mg/dL   Urobilinogen, UA 1.0  0.0 - 1.0 mg/dL   Nitrite NEGATIVE  NEGATIVE   Leukocytes, UA NEGATIVE  NEGATIVE  POCT PREGNANCY, URINE     Status: Abnormal   Collection Time    06/08/13 11:33 AM      Result Value Range   Preg Test, Ur POSITIVE (*) NEGATIVE  CBC     Status: None   Collection Time    06/08/13 12:05 PM      Result Value Range   WBC 8.1  4.0 - 10.5 K/uL   RBC 4.39  3.87 - 5.11 MIL/uL   Hemoglobin 13.2  12.0 - 15.0 g/dL   HCT 16.1  09.6 - 04.5 %   MCV 87.9  78.0 - 100.0 fL   MCH 30.1  26.0 - 34.0 pg   MCHC 34.2  30.0 - 36.0 g/dL   RDW 40.9  81.1 - 91.4 %   Platelets 258  150 - 400 K/uL  HCG, QUANTITATIVE, PREGNANCY     Status: Abnormal   Collection Time    06/08/13 12:05 PM      Result Value Range   hCG, Beta Chain, Quant, S 344 (*) <5 mIU/mL  ABO/RH     Status: None   Collection Time    06/08/13 12:05 PM      Result Value Range   ABO/RH(D) B POS      MAU Course  Procedures None  MDM U/S: No visible IUP or adnexal mass visualized  Assessment and Plan  A: Pregnancy following tubal ligation with high level of concern for ectopic  P: 1. Patient instructed to return to MAU in 48 hours for repeat quantitaive hCG 2. Return sooner should any bleeding or worsening of symptoms occur 3. Will likely need repeat U/S in 1 week  Arther Abbott 06/08/2013, 1:40 PM   I saw and examined patient along with student and agree with above note.   Cheryal Salas 06/09/2013 7:45 PM

## 2013-06-08 NOTE — MAU Note (Addendum)
States has hx of R ectopic pregnancy requiring surgery. States had 2 positive HPT's and was worried when she started having LLQ pain 2 days ago. Also has some SOB and pain and numbness in left upper leg. States she had BTL after 3rd delivery. Had ectopic after that and now +UPT again.

## 2013-06-08 NOTE — MAU Note (Signed)
Patient states she has been having left lower abdominal pain for 2 days. Had a positive home pregnancy test today. Denies bleeding or discharge.

## 2013-06-10 ENCOUNTER — Inpatient Hospital Stay (HOSPITAL_COMMUNITY)
Admission: AD | Admit: 2013-06-10 | Discharge: 2013-06-10 | Disposition: A | Discharge status: Home / Self Care | Payer: Medicaid Other | Source: Ambulatory Visit | Attending: Obstetrics and Gynecology | Admitting: Obstetrics and Gynecology

## 2013-06-10 DIAGNOSIS — O26899 Other specified pregnancy related conditions, unspecified trimester: Secondary | ICD-10-CM

## 2013-06-10 DIAGNOSIS — Z09 Encounter for follow-up examination after completed treatment for conditions other than malignant neoplasm: Secondary | ICD-10-CM

## 2013-06-10 DIAGNOSIS — R109 Unspecified abdominal pain: Secondary | ICD-10-CM | POA: Insufficient documentation

## 2013-06-10 DIAGNOSIS — O99891 Other specified diseases and conditions complicating pregnancy: Secondary | ICD-10-CM | POA: Insufficient documentation

## 2013-06-10 NOTE — MAU Provider Note (Signed)
Chart reviewed and agree with management and plan.  

## 2013-06-10 NOTE — MAU Provider Note (Signed)
History     CSN: [id number]  Arrival date and time: 06/10/13 1404   None     No chief complaint on file.  HPI [name] is 32 y.o. [id number] [redacted]w[redacted]d weeks presenting for repeat BHCG.  She was seen 6/17 for LLQ and contraction like pain with 2 UPTs at home.  On that visit BHCG 344, blood type is B+ and U/S showed not IUP.   Patient reports she has had her tubes tied and she doesn't have a right ovary.  She denies bleeding today and only "a smidgen of pain" on the left.    Past Medical History  Diagnosis Date  . Irritable bowel   . Asthma     meds as needed  . Ectopic pregnancy     requiring surgery  . Gonorrhea   . Preterm labor     Past Surgical History  Procedure Laterality Date  . Wisdom tooth extraction    . Tubal ligation  2005  . Laparoscopy  05/30/2012    Procedure: LAPAROSCOPY OPERATIVE;  Surgeon: Tereso Newcomer, MD;  Location: [facility];  Service: Gynecology;  Laterality: N/A;  Operative laparoscopy right salpingectomy and removal of ectopic pregnancy  . Salpingectomy    . Laparoscopy for ectopic pregnancy      No family history on file.  History  Substance Use Topics  . Smoking status: Former Games developer  . Smokeless tobacco: Never Used  . Alcohol Use: Yes     Comment: occasional    Allergies:  Allergies  Allergen Reactions  . Hydrocodeine (Dihydrocodeine) Hives  . Latex Hives  . Vioxx (Rofecoxib) Hives    Prescriptions prior to admission  Medication Sig Dispense Refill  . albuterol (PROVENTIL HFA;VENTOLIN HFA) 108 (90 BASE) MCG/ACT inhaler Inhale 2 puffs into the lungs every 6 (six) hours as needed for wheezing.        Review of Systems  Gastrointestinal: Positive for abdominal pain ("smidgen" of pain on the left lower quadrant).  Genitourinary:       Negative for vaginal bleeding   Physical Exam   Last menstrual period 04/26/2013.  Physical Exam  Constitutional: She is oriented to person, place, and time. She appears well-developed and  well-nourished. No distress.  Genitourinary:  Not indicated  Neurological: She is alert and oriented to person, place, and time.  Skin: Skin is warm and dry.  Psychiatric: She has a normal mood and affect. Her behavior is normal.   Results for orders placed during the hospital encounter of 06/10/13 (from the past 24 hour(s))  HCG, QUANTITATIVE, PREGNANCY     Status: Abnormal   Collection Time    06/10/13  2:20 PM      Result Value Range   hCG, Beta Chain, Quant, S 304 (*) <5 mIU/mL   MAU Course  Procedures  MDM Discussed BHCG levels with the patient in addition to higher risk of ectopic with hx of BTL and previous tubal pregnancy.  I then called Dr. Jolayne Panther regarding patient's desire to wait to see if this is a miscarriage rather than an ectopic.  Dr. Jolayne Panther ask that I go back over with her increased  risk of ectopic pregnancy and her BHCG levels which did not double.  I re-discussed this with the patient.  It is her clear choice to wait for another 48 hrs to recheck BHCG with the promise of coming back in before that time if she has pain or vaginal bleeding.  She will return Saturday June 21 at  2:30 for repeat labs Assessment and Plan  A:  Non reassuring BHCG levels      Hx of abdominal pain with this pregnancy      HX of previous ectopic on the right with salpingectomy and oophorectomy      Probably ectopic  P: Return for repeat BHCG on Saturday, June 21      Return before that date if pain or bleeding occurs.     Patient agrees to come back for sxs of ectopic.  Precautions given.  KEY,EVE M 06/10/2013, 2:10 PM

## 2013-06-10 NOTE — MAU Note (Signed)
Patient to MAU for follow up BHCG. Patient denies bleeding but does have a little cramping.

## 2013-06-12 ENCOUNTER — Inpatient Hospital Stay (HOSPITAL_COMMUNITY)
Admission: AD | Admit: 2013-06-12 | Discharge: 2013-06-12 | Disposition: A | Discharge status: Home / Self Care | Payer: Medicaid Other | Source: Ambulatory Visit | Attending: Obstetrics & Gynecology | Admitting: Obstetrics & Gynecology

## 2013-06-12 DIAGNOSIS — O039 Complete or unspecified spontaneous abortion without complication: Secondary | ICD-10-CM | POA: Insufficient documentation

## 2013-06-12 LAB — HCG, QUANTITATIVE, PREGNANCY: hCG, Beta Chain, Quant, S: 296 m[IU]/mL — ABNORMAL HIGH (ref <5)

## 2013-06-12 MED ORDER — MISOPROSTOL 200 MCG PO TABS: ORAL_TABLET | ORAL | Status: DC

## 2013-06-12 MED ORDER — PROMETHAZINE HCL 12.5 MG PO TABS: 12.5000 mg | ORAL_TABLET | Freq: Four times a day (QID) | ORAL | Status: DC | PRN

## 2013-06-12 MED ORDER — OXYCODONE-ACETAMINOPHEN 5-325 MG PO TABS: 2.0000 | ORAL_TABLET | ORAL | Status: DC | PRN

## 2013-06-12 MED ORDER — IBUPROFEN 800 MG PO TABS: 800.0000 mg | ORAL_TABLET | Freq: Three times a day (TID) | ORAL | Status: DC

## 2013-06-12 NOTE — MAU Provider Note (Signed)
[name] is a 32 y.o. [id number] at [redacted]w[redacted]d who presents to MAU for follow-up quant hCG. The patient denies pain or bleeding today.   BP 144/73  Pulse 83  Resp 18  LMP 04/26/2013 GENERAL: Well-developed, well-nourished female in no acute distress.  HEENT: Normocephalic, atraumatic.   LUNGS: Effort normal HEART: Regular rate  SKIN: Warm, dry and without erythema PSYCH: Normal mood and affect  Results for orders placed during the hospital encounter of 06/12/13 (from the past 24 hour(s))  HCG, QUANTITATIVE, PREGNANCY     Status: Abnormal   Collection Time    06/12/13  2:28 PM      Result Value Range   hCG, Beta Chain, Quant, S 296 (*) <5 mIU/mL    MDM Discussed patient with Dr. Marice Potter. Ok to offer Cytotec if patient desires vs expectant management. Patient to follow-up in 1 week Discussed expectant management vs Cytotec. Patient desires Cytotec.  Patient states that she has taken ibuprofen without a reaction and has taken Percocet previously as well, but will take with Benadryl if needed.   A: SAB  P: Discharge home Rx for Cytotec, Perocet, Ibuprofen and phenergan given/sent to patient's pharmacy Bleeding precautions discussed Patient will follow-up in [facility] clinic in 1 week for follow-up hCG Patient may return to MAU as needed or if her condition were to change or worsen  Freddi Starr, PA-C 06/12/2013 4:06 PM

## 2013-06-12 NOTE — MAU Note (Signed)
Pt presents for repeat labs, BHCG

## 2013-06-14 NOTE — MAU Provider Note (Signed)
Attestation of Attending Supervision of Advanced Practitioner (CNM/NP): Evaluation and management procedures were performed by the Advanced Practitioner under my supervision and collaboration.  I have reviewed the Advanced Practitioner's note and chart, and I agree with the management and plan.  Bergen Magner 06/14/2013 12:11 PM

## 2013-06-16 ENCOUNTER — Inpatient Hospital Stay (HOSPITAL_COMMUNITY): Payer: Medicaid Other

## 2013-06-16 ENCOUNTER — Inpatient Hospital Stay (HOSPITAL_COMMUNITY)
Admission: AD | Admit: 2013-06-16 | Discharge: 2013-06-16 | Disposition: A | Discharge status: Home / Self Care | Payer: Medicaid Other | Source: Ambulatory Visit | Attending: Obstetrics & Gynecology | Admitting: Obstetrics & Gynecology

## 2013-06-16 ENCOUNTER — Encounter (HOSPITAL_COMMUNITY): Payer: Self-pay | Admitting: *Deleted

## 2013-06-16 DIAGNOSIS — O021 Missed abortion: Secondary | ICD-10-CM | POA: Insufficient documentation

## 2013-06-16 DIAGNOSIS — O00109 Unspecified tubal pregnancy without intrauterine pregnancy: Secondary | ICD-10-CM | POA: Insufficient documentation

## 2013-06-16 DIAGNOSIS — R109 Unspecified abdominal pain: Secondary | ICD-10-CM | POA: Insufficient documentation

## 2013-06-16 LAB — COMPREHENSIVE METABOLIC PANEL
Albumin: 3.5 g/dL (ref 3.5–5.2)
Alkaline Phosphatase: 122 U/L — ABNORMAL HIGH (ref 39–117)
BUN: 6 mg/dL (ref 6–23)
Chloride: 103 mEq/L (ref 96–112)
Creatinine, Ser: 0.67 mg/dL (ref 0.50–1.10)
GFR calc Af Amer: >90 mL/min (ref >90)
GFR calc non Af Amer: >90 mL/min (ref >90)
Glucose, Bld: 89 mg/dL (ref 70–99)
Total Bilirubin: 0.2 mg/dL — ABNORMAL LOW (ref 0.3–1.2)

## 2013-06-16 LAB — CBC
MCV: 89.2 fL (ref 78.0–100.0)
Platelets: 233 10*3/uL (ref 150–400)
RBC: 4.06 MIL/uL (ref 3.87–5.11)
RDW: 13.1 % (ref 11.5–15.5)
WBC: 8.3 10*3/uL (ref 4.0–10.5)

## 2013-06-16 MED ORDER — METHOTREXATE INJECTION FOR WOMEN'S HOSPITAL: 50.0000 mg/m2 | Freq: Once | INTRAMUSCULAR | Status: DC

## 2013-06-16 NOTE — MAU Note (Signed)
Patient states she was given a Rx for Cytotec for an early pregnancy loss but decided not to take the pills. Patient states she continues to have pregnancy symptoms and. Would like to have another BHCG done. Denies pain or bleeding.

## 2013-06-16 NOTE — MAU Provider Note (Signed)
History     CSN: [id number]  Arrival date and time: 06/16/13 1346   First Provider Initiated Contact with Patient 06/16/13 1848    followup of HCG levels and u/s in pt being followed for pregancy of unknowln location since 6/17, with progressive declining QHcg's now 284.  Blood type Rh pos.  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  Chief Complaint  Patient presents with  . Follow-up   HPIstatus post salpingectomy, s/p tubal ligation  Pertinent Gynecological History: Menses:  Bleeding:  Contraception: tubal ligation DES exposure: unknown Blood transfusions: none Sexually transmitted diseases: no past history Previous GYN Procedures: salpingectomy for ectopic  Last mammogram:  Date:  Last pap:  Date:  tubal ligation after last preg, prior right salpingectomy.  Past Medical History  Diagnosis Date  . Irritable bowel   . Asthma     meds as needed  . Ectopic pregnancy     requiring surgery  . Gonorrhea   . Preterm labor     Past Surgical History  Procedure Laterality Date  . Wisdom tooth extraction    . Tubal ligation  2005  . Laparoscopy  05/30/2012    Procedure: LAPAROSCOPY OPERATIVE;  Surgeon: Tereso Newcomer, MD;  Location: [facility];  Service: Gynecology;  Laterality: N/A;  Operative laparoscopy right salpingectomy and removal of ectopic pregnancy  . Salpingectomy    . Laparoscopy for ectopic pregnancy      History reviewed. No pertinent family history.  History  Substance Use Topics  . Smoking status: Former Games developer  . Smokeless tobacco: Never Used  . Alcohol Use: Yes     Comment: occasional    Allergies:  Allergies  Allergen Reactions  . Hydrocodeine (Dihydrocodeine) Hives  . Latex Hives  . Vioxx (Rofecoxib) Hives    Prescriptions prior to admission  Medication Sig Dispense Refill  . albuterol (PROVENTIL HFA;VENTOLIN HFA) 108 (90 BASE) MCG/ACT inhaler Inhale 2 puffs into the lungs every 6 (six) hours as needed for  wheezing.      . calcium carbonate (TUMS - DOSED IN MG ELEMENTAL CALCIUM) 500 MG chewable tablet Chew 2 tablets by mouth 3 (three) times daily as needed for heartburn.      Marland Kitchen ibuprofen (ADVIL,MOTRIN) 800 MG tablet Take 1 tablet (800 mg total) by mouth 3 (three) times daily.  21 tablet  0  . misoprostol (CYTOTEC) 200 MCG tablet Place 4 tabs (800 mcg) in the vagina once  4 tablet  0  . oxyCODONE-acetaminophen (PERCOCET/ROXICET) 5-325 MG per tablet Take 2 tablets by mouth every 4 (four) hours as needed for pain.  15 tablet  0  . promethazine (PHENERGAN) 12.5 MG tablet Take 1 tablet (12.5 mg total) by mouth every 6 (six) hours as needed for nausea.  30 tablet  0    ROS Physical Exam   Blood pressure 127/76, pulse 75, temperature 98.3 F (36.8 C), temperature source Oral, resp. rate 16, last menstrual period 04/26/2013, SpO2 100.00%.  Physical Exam Physical Examination: General appearance - alert, well appearing, and in no distress, oriented to person, place, and time and well hydrated Mental status - alert, oriented to person, place, and time, normal mood, behavior, speech, dress, motor activity, and thought processes, affect appropriate to mood Abdomen - soft, nontender, nondistended, no masses or organomegaly   MAU Course  Procedures  MDM Review of labs, review of course, and ultrasound. Discussion of results in detail with patient.  Assessment and Plan  Left ectopic pregnancy, (high likelihood)  vs missed ab (lesser llikelihood)  Treatement options reveiwed at length.  Pt opts for MTX therapy tonight. Methotrexate 50 mg /m2 , CMP ordered.  Macallister Ashmead V 06/16/2013, 7:01 PM

## 2013-06-16 NOTE — MAU Note (Signed)
Pt denies pain.

## 2013-06-16 NOTE — MAU Note (Signed)
Pt unsure of previous plan of care, provided by Dr Emelda Fear. Pt refusing methotrexate at this time.Pt states she doesn't fell like she did before when she had an ectopic pregnancy. Pt understands the risks of note having this treatment at this time. Pt states she will come back for pain or bleeding. Pt not feeling either at this time. Pt Signed out AMA. Dr Emelda Fear at  Bedside at time [facility] formed signed. Pt denies pain and bleeding.

## 2013-06-16 NOTE — MAU Provider Note (Signed)
History     CSN: [id number]  Arrival date and time: 06/16/13 1346   None     Chief Complaint  Patient presents with  . Follow-up   HPI [name] is 32 y.o. [id number] [redacted]w[redacted]d weeks presenting with cramping in early pregnancy.  She has been followed X 3 with non reassuring BCHGs ---6/17-344; 6/19- 304; and 6/21- 296.  This is desired pregnancy and even thought patient understands the hormone levels are not reassuring and she has rx for Cytotec not yet taken, she wants to make sure with another blood test.  Hx of ectopic with removal of her right tube and she has had a BTL.     Past Medical History  Diagnosis Date  . Irritable bowel   . Asthma     meds as needed  . Ectopic pregnancy     requiring surgery  . Gonorrhea   . Preterm labor     Past Surgical History  Procedure Laterality Date  . Wisdom tooth extraction    . Tubal ligation  2005  . Laparoscopy  05/30/2012    Procedure: LAPAROSCOPY OPERATIVE;  Surgeon: Tereso Newcomer, MD;  Location: [facility];  Service: Gynecology;  Laterality: N/A;  Operative laparoscopy right salpingectomy and removal of ectopic pregnancy  . Salpingectomy    . Laparoscopy for ectopic pregnancy      No family history on file.  History  Substance Use Topics  . Smoking status: Former Games developer  . Smokeless tobacco: Never Used  . Alcohol Use: Yes     Comment: occasional    Allergies:  Allergies  Allergen Reactions  . Hydrocodeine (Dihydrocodeine) Hives  . Latex Hives  . Vioxx (Rofecoxib) Hives    Prescriptions prior to admission  Medication Sig Dispense Refill  . albuterol (PROVENTIL HFA;VENTOLIN HFA) 108 (90 BASE) MCG/ACT inhaler Inhale 2 puffs into the lungs every 6 (six) hours as needed for wheezing.      Marland Kitchen ibuprofen (ADVIL,MOTRIN) 800 MG tablet Take 1 tablet (800 mg total) by mouth 3 (three) times daily.  21 tablet  0  . misoprostol (CYTOTEC) 200 MCG tablet Place 4 tabs (800 mcg) in the vagina once  4 tablet  0  .  oxyCODONE-acetaminophen (PERCOCET/ROXICET) 5-325 MG per tablet Take 2 tablets by mouth every 4 (four) hours as needed for pain.  15 tablet  0  . promethazine (PHENERGAN) 12.5 MG tablet Take 1 tablet (12.5 mg total) by mouth every 6 (six) hours as needed for nausea.  30 tablet  0    Review of Systems  Constitutional: Negative for fever and chills.  Gastrointestinal: Positive for abdominal pain (cramping-slight). Negative for nausea and vomiting.  Genitourinary:       Neg for bleeding  Neurological: Negative for headaches.   Physical Exam   Blood pressure 127/76, pulse 75, temperature 98.3 F (36.8 C), temperature source Oral, resp. rate 16, last menstrual period 04/26/2013, SpO2 100.00%.  Physical Exam  Constitutional: She is oriented to person, place, and time. She appears well-developed and well-nourished. No distress (smiling and laughing).  HENT:  Head: Normocephalic.  Neck: Normal range of motion.  Neurological: She is alert and oriented to person, place, and time.  Skin: Skin is warm and dry.  Psychiatric: She has a normal mood and affect. Her behavior is normal.      Results for orders placed during the hospital encounter of 06/16/13 (from the past 24 hour(s))  HCG, QUANTITATIVE, PREGNANCY     Status: Abnormal  Collection Time    06/16/13  2:32 PM      Result Value Range   hCG, Beta Chain, Quant, S 684 (*) <5 mIU/mL   MAU Course  Procedures  MDM 15:45  Reviewed HPI, recent labs/u/s with Dr. Debroah Loop.  Repeat U/S 15:50  Discussed with the patient the BHCG level and plan for repeat ultrasound Discussed HPI, labs and today's ultrasound with Dr. Emelda Fear.  He will review U/S and come in to see patient.  Care turned over to Dr. Emelda Fear.  Assessment and Plan    Madaline Lefeber,EVE M 06/16/2013, 3:40 PM

## 2013-06-18 ENCOUNTER — Inpatient Hospital Stay (HOSPITAL_COMMUNITY)
Admission: AD | Admit: 2013-06-18 | Discharge: 2013-06-18 | Disposition: A | Discharge status: Home / Self Care | Payer: Medicaid Other | Source: Ambulatory Visit | Attending: Obstetrics & Gynecology | Admitting: Obstetrics & Gynecology

## 2013-06-18 ENCOUNTER — Other Ambulatory Visit: Payer: Medicaid Other

## 2013-06-18 ENCOUNTER — Encounter (HOSPITAL_COMMUNITY): Payer: Self-pay | Admitting: *Deleted

## 2013-06-18 ENCOUNTER — Inpatient Hospital Stay (HOSPITAL_COMMUNITY): Payer: Medicaid Other

## 2013-06-18 DIAGNOSIS — O009 Unspecified ectopic pregnancy without intrauterine pregnancy: Secondary | ICD-10-CM

## 2013-06-18 DIAGNOSIS — O00109 Unspecified tubal pregnancy without intrauterine pregnancy: Secondary | ICD-10-CM | POA: Insufficient documentation

## 2013-06-18 DIAGNOSIS — O039 Complete or unspecified spontaneous abortion without complication: Secondary | ICD-10-CM

## 2013-06-18 LAB — CBC
Platelets: 239 10*3/uL (ref 150–400)
RBC: 4.25 MIL/uL (ref 3.87–5.11)
RDW: 12.9 % (ref 11.5–15.5)
WBC: 7.9 10*3/uL (ref 4.0–10.5)

## 2013-06-18 LAB — HCG, QUANTITATIVE, PREGNANCY
hCG, Beta Chain, Quant, S: 1358.6 m[IU]/mL
hCG, Beta Chain, Quant, S: 908 m[IU]/mL — ABNORMAL HIGH (ref <5)

## 2013-06-18 LAB — CREATININE, SERUM: Creatinine, Ser: 0.64 mg/dL (ref 0.50–1.10)

## 2013-06-18 LAB — BUN: BUN: 8 mg/dL (ref 6–23)

## 2013-06-18 LAB — AST: AST: 12 U/L (ref 0–37)

## 2013-06-18 MED ORDER — METHOTREXATE INJECTION FOR WOMEN'S HOSPITAL
50.0000 mg/m2 | Freq: Once | INTRAMUSCULAR | Status: AC
  Administered 2013-06-18: 95 mg via INTRAMUSCULAR
  Filled 2013-06-18: qty 1.9

## 2013-06-18 NOTE — MAU Provider Note (Signed)
History     CSN: [id number]  Arrival date and time: 06/18/13 1151   None     Chief Complaint  Patient presents with  . Vaginal Bleeding  . Dizziness   HPI 32 y.o. [id number] at [redacted]w[redacted]d by LMP. Being followed this pregnancy with inappropriately rising quant HCG. Seen last in MAU on 6/25, Dr. Emelda Fear offered MTX, pt declined and left AMA. States she wants to know for certain that this is an ectopic before using MTX. Has h/o BTL and subsequent right ectopic pregnancy last year, resolved with surgery.  Feeling tired and having some vomiting today. No pain, intermittent bleeding.   Past Medical History  Diagnosis Date  . Irritable bowel   . Asthma     meds as needed  . Ectopic pregnancy     requiring surgery  . Gonorrhea   . Preterm labor     Past Surgical History  Procedure Laterality Date  . Wisdom tooth extraction    . Tubal ligation  2005  . Laparoscopy  05/30/2012    Procedure: LAPAROSCOPY OPERATIVE;  Surgeon: Tereso Newcomer, MD;  Location: [facility];  Service: Gynecology;  Laterality: N/A;  Operative laparoscopy right salpingectomy and removal of ectopic pregnancy  . Salpingectomy    . Laparoscopy for ectopic pregnancy      History reviewed. No pertinent family history.  History  Substance Use Topics  . Smoking status: Former Games developer  . Smokeless tobacco: Never Used  . Alcohol Use: Yes     Comment: occasional    Allergies:  Allergies  Allergen Reactions  . Hydrocodeine (Dihydrocodeine) Hives  . Latex Hives  . Vioxx (Rofecoxib) Hives    Prescriptions prior to admission  Medication Sig Dispense Refill  . albuterol (PROVENTIL HFA;VENTOLIN HFA) 108 (90 BASE) MCG/ACT inhaler Inhale 2 puffs into the lungs every 6 (six) hours as needed for wheezing.      . calcium carbonate (TUMS - DOSED IN MG ELEMENTAL CALCIUM) 500 MG chewable tablet Chew 2 tablets by mouth 3 (three) times daily as needed for heartburn.      Marland Kitchen oxyCODONE-acetaminophen (PERCOCET/ROXICET) 5-325 MG per  tablet Take 2 tablets by mouth every 4 (four) hours as needed for pain.  15 tablet  0  . promethazine (PHENERGAN) 12.5 MG tablet Take 1 tablet (12.5 mg total) by mouth every 6 (six) hours as needed for nausea.  30 tablet  0  . [DISCONTINUED] ibuprofen (ADVIL,MOTRIN) 800 MG tablet Take 1 tablet (800 mg total) by mouth 3 (three) times daily.  21 tablet  0  . [DISCONTINUED] misoprostol (CYTOTEC) 200 MCG tablet Place 4 tabs (800 mcg) in the vagina once  4 tablet  0    Review of Systems  Constitutional: Negative.   Respiratory: Negative.   Cardiovascular: Negative.   Gastrointestinal: Positive for abdominal pain (mild LLQ). Negative for nausea, vomiting, diarrhea and constipation.  Genitourinary: Negative for dysuria, urgency, frequency, hematuria and flank pain.       Positive for vaginal bleeding   Musculoskeletal: Negative.   Neurological: Negative.   Psychiatric/Behavioral: Negative.    Physical Exam   Blood pressure 123/74, pulse 78, temperature 98.7 F (37.1 C), temperature source Oral, resp. rate 16, height 5\' 5"  (1.651 m), weight 181 lb 8 oz (82.328 kg), last menstrual period 04/26/2013, SpO2 100.00%.  Physical Exam  Nursing note and vitals reviewed. Constitutional: She is oriented to person, place, and time. She appears well-developed and well-nourished. No distress.  Cardiovascular: Normal rate.   Respiratory: Effort normal.  GI: Soft. She exhibits no mass. There is tenderness (mild LLQ). There is no rebound and no guarding.  Musculoskeletal: Normal range of motion.  Neurological: She is alert and oriented to person, place, and time.    MAU Course  Procedures Results for orders placed during the hospital encounter of 06/18/13 (from the past 24 hour(s))  HCG, QUANTITATIVE, PREGNANCY     Status: Abnormal   Collection Time    06/18/13 11:59 AM      Result Value Range   hCG, Beta Chain, Quant, S 908 (*) <5 mIU/mL  CBC     Status: None   Collection Time    06/18/13 12:00 PM       Result Value Range   WBC 7.9  4.0 - 10.5 K/uL   RBC 4.25  3.87 - 5.11 MIL/uL   Hemoglobin 12.8  12.0 - 15.0 g/dL   HCT 40.9  81.1 - 91.4 %   MCV 88.0  78.0 - 100.0 fL   MCH 30.1  26.0 - 34.0 pg   MCHC 34.2  30.0 - 36.0 g/dL   RDW 78.2  95.6 - 21.3 %   Platelets 239  150 - 400 K/uL  AST     Status: None   Collection Time    06/18/13 12:00 PM      Result Value Range   AST 12  0 - 37 U/L  BUN     Status: None   Collection Time    06/18/13 12:00 PM      Result Value Range   BUN 8  6 - 23 mg/dL  CREATININE, SERUM     Status: None   Collection Time    06/18/13 12:00 PM      Result Value Range   Creatinine, Ser 0.64  0.50 - 1.10 mg/dL   GFR calc non Af Amer >90  >90 mL/min   GFR calc Af Amer >90  >90 mL/min     Assessment and Plan   1. Ectopic pregnancy   MTX today for ectopic. Precautions rev'd. F/U on day 4 per protocol.     Medication List    STOP taking these medications       ibuprofen 800 MG tablet  Commonly known as:  ADVIL,MOTRIN     misoprostol 200 MCG tablet  Commonly known as:  CYTOTEC      TAKE these medications       albuterol 108 (90 BASE) MCG/ACT inhaler  Commonly known as:  PROVENTIL HFA;VENTOLIN HFA  Inhale 2 puffs into the lungs every 6 (six) hours as needed for wheezing.     calcium carbonate 500 MG chewable tablet  Commonly known as:  TUMS - dosed in mg elemental calcium  Chew 2 tablets by mouth 3 (three) times daily as needed for heartburn.     oxyCODONE-acetaminophen 5-325 MG per tablet  Commonly known as:  PERCOCET/ROXICET  Take 2 tablets by mouth every 4 (four) hours as needed for pain.     promethazine 12.5 MG tablet  Commonly known as:  PHENERGAN  Take 1 tablet (12.5 mg total) by mouth every 6 (six) hours as needed for nausea.        Follow-up Information   Follow up with [facility] On 06/21/2013. (for repeat labs)    Contact information:   [address] [id number] [place] [address] [phone]        [facility] 06/18/2013, 3:16 PM

## 2013-06-18 NOTE — MAU Note (Signed)
Pt states was told she has probable ectopic. Wants to be 100% sure that she has ectopic before taking MTX. Hormone levels inappropriately rose. No pain at present

## 2013-06-18 NOTE — MAU Provider Note (Signed)
Attestation of Attending Supervision of Advanced Practitioner (PA/CNM/NP): Evaluation and management procedures were performed by the Advanced Practitioner under my supervision and collaboration.  I have reviewed the Advanced Practitioner's note and chart, and I agree with the management and plan.  Indya Oliveria, MD, FACOG Attending Obstetrician & Gynecologist Faculty Practice, [facility]   

## 2013-07-14 ENCOUNTER — Ambulatory Visit: Payer: Medicaid Other | Admitting: Family Medicine

## 2013-08-16 ENCOUNTER — Emergency Department (HOSPITAL_COMMUNITY)
Admission: EM | Admit: 2013-08-16 | Discharge: 2013-08-16 | Disposition: A | Discharge status: Home / Self Care | Payer: Medicaid Other | Attending: Emergency Medicine | Admitting: Emergency Medicine

## 2013-08-16 ENCOUNTER — Encounter (HOSPITAL_COMMUNITY): Payer: Self-pay | Admitting: *Deleted

## 2013-08-16 DIAGNOSIS — M545 Low back pain, unspecified: Secondary | ICD-10-CM | POA: Insufficient documentation

## 2013-08-16 DIAGNOSIS — G8929 Other chronic pain: Secondary | ICD-10-CM | POA: Insufficient documentation

## 2013-08-16 DIAGNOSIS — Z8619 Personal history of other infectious and parasitic diseases: Secondary | ICD-10-CM | POA: Insufficient documentation

## 2013-08-16 DIAGNOSIS — Z8719 Personal history of other diseases of the digestive system: Secondary | ICD-10-CM | POA: Insufficient documentation

## 2013-08-16 DIAGNOSIS — J45909 Unspecified asthma, uncomplicated: Secondary | ICD-10-CM | POA: Insufficient documentation

## 2013-08-16 DIAGNOSIS — Z8739 Personal history of other diseases of the musculoskeletal system and connective tissue: Secondary | ICD-10-CM | POA: Insufficient documentation

## 2013-08-16 DIAGNOSIS — Z91040 Latex allergy status: Secondary | ICD-10-CM | POA: Insufficient documentation

## 2013-08-16 DIAGNOSIS — Z87891 Personal history of nicotine dependence: Secondary | ICD-10-CM | POA: Insufficient documentation

## 2013-08-16 HISTORY — DX: Unspecified osteoarthritis, unspecified site: M19.90

## 2013-08-16 HISTORY — DX: Other chronic pain: G89.29

## 2013-08-16 HISTORY — DX: Dorsalgia, unspecified: M54.9

## 2013-08-16 MED ORDER — METHOCARBAMOL 500 MG PO TABS: 500.0000 mg | ORAL_TABLET | Freq: Two times a day (BID) | ORAL | Status: DC

## 2013-08-16 MED ORDER — TRAMADOL HCL 50 MG PO TABS: 50.0000 mg | ORAL_TABLET | Freq: Four times a day (QID) | ORAL | Status: DC | PRN

## 2013-08-16 MED ORDER — IBUPROFEN 800 MG PO TABS: 800.0000 mg | ORAL_TABLET | Freq: Three times a day (TID) | ORAL | Status: DC

## 2013-08-16 NOTE — ED Provider Notes (Signed)
CSN: [id number]     Arrival date & time 08/16/13  1044 History     First MD Initiated Contact with Patient 08/16/13 1141     Chief Complaint  Patient presents with  . Back Pain   (Consider location/radiation/quality/duration/timing/severity/associated sxs/prior Treatment) HPI Comments: Patient presents with a chief complaint of lower back pain.  She reports that her pain has been present for the past five days and has been constant.  Pain does not radiate.  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  She states that heat and Ibuprofen do cause mild temporary relief.    Patient is a 32 y.o. female presenting with back pain. The history is provided by the patient.  Back Pain Context: lifting heavy objects   Worsened by:  Bending and ambulation Associated symptoms: no abdominal pain, no bladder incontinence, no bowel incontinence, no dysuria, no fever, no numbness, no paresthesias, no tingling and no weakness     Past Medical History  Diagnosis Date  . Irritable bowel   . Asthma     meds as needed  . Ectopic pregnancy     requiring surgery  . Gonorrhea   . Preterm labor   . Arthritis   . Chronic back pain    Past Surgical History  Procedure Laterality Date  . Wisdom tooth extraction    . Tubal ligation  2005  . Laparoscopy  05/30/2012    Procedure: LAPAROSCOPY OPERATIVE;  Surgeon: Tereso Newcomer, MD;  Location: [facility];  Service: Gynecology;  Laterality: N/A;  Operative laparoscopy right salpingectomy and removal of ectopic pregnancy  . Salpingectomy    . Laparoscopy for ectopic pregnancy     No family history on file. History  Substance Use Topics  . Smoking status: Former Games developer  . Smokeless tobacco: Never Used  . Alcohol Use: Yes     Comment:  occasional   OB History   Grav Para Term Preterm Abortions TAB SAB Ect Mult Living   6 3 2 1 2  1 1  3      Review of Systems  Constitutional: Negative for fever.  Gastrointestinal: Negative for abdominal pain and bowel incontinence.  Genitourinary: Negative for bladder incontinence and dysuria.  Musculoskeletal: Positive for back pain.  Neurological: Negative for tingling, weakness, numbness and paresthesias.  All other systems reviewed and are negative.    Allergies  Hydrocodeine; Latex; and Vioxx  Home Medications   Current Outpatient Rx  Name  Route  Sig  Dispense  Refill  . ibuprofen (ADVIL,MOTRIN) 800 MG tablet   Oral   Take 800 mg by mouth every 8 (eight) hours as needed for pain.          BP 133/90  Pulse 76  Temp(Src) 98 F (36.7 C) (Oral)  Resp 16  SpO2 99%  LMP 07/23/2013  Breastfeeding? Unknown Physical Exam  Nursing note and vitals reviewed. Constitutional: She appears well-developed and well-nourished. No distress.  HENT:  Head: Normocephalic and atraumatic.  Cardiovascular: Normal rate, regular rhythm and normal heart sounds.   Pulses:      Radial pulses are 2+ on the right side, and 2+ on the left side.  Pulmonary/Chest: Effort normal and breath sounds normal.  Musculoskeletal:  Cervical back: She exhibits normal range of motion, no tenderness, no bony tenderness, no swelling, no edema and no deformity.       Thoracic back: She exhibits normal range of motion, no tenderness, no bony tenderness, no swelling, no edema and no deformity.       Lumbar back: She exhibits tenderness. She exhibits normal range of motion, no swelling, no edema and no deformity.       Arms: Increased pain with ROM of lower back  Neurological: She is alert. She has normal strength. No sensory deficit. Gait normal.  Skin: Skin is warm and dry. She is not diaphoretic.  Psychiatric: She has a normal mood and affect.    ED Course   Procedures (including critical care  time)  Labs Reviewed - No data to display No results found. No diagnosis found.  MDM  Patient with lower back pain.  No neurological deficits and normal neuro exam.  Patient can walk but states is painful.  No loss of bowel or bladder control.  No concern for cauda equina.  No fever, night sweats, weight loss, h/o cancer, IVDU.  RICE protocol and pain medicine indicated and discussed with patient.   Return precautions given.   Pascal Lux Rafael [name], PA-C 08/16/13 1517

## 2013-08-16 NOTE — ED Notes (Signed)
Pt with hx of chronic lower back pain r/t "injured disc".  C/o lower back after starting work on [employer].  Pain is relieved by heat.

## 2013-08-18 NOTE — ED Provider Notes (Signed)
Medical screening examination/treatment/procedure(s) were performed by non-physician practitioner and as supervising physician I was immediately available for consultation/collaboration.   Laray Anger, DO 08/18/13 2059

## 2013-08-22 ENCOUNTER — Emergency Department (HOSPITAL_COMMUNITY)
Admission: EM | Admit: 2013-08-22 | Discharge: 2013-08-22 | Disposition: A | Discharge status: Home / Self Care | Payer: Medicaid Other | Attending: Emergency Medicine | Admitting: Emergency Medicine

## 2013-08-22 ENCOUNTER — Emergency Department (HOSPITAL_COMMUNITY): Payer: Medicaid Other

## 2013-08-22 ENCOUNTER — Encounter (HOSPITAL_COMMUNITY): Payer: Self-pay | Admitting: Physical Medicine and Rehabilitation

## 2013-08-22 DIAGNOSIS — Z8719 Personal history of other diseases of the digestive system: Secondary | ICD-10-CM | POA: Insufficient documentation

## 2013-08-22 DIAGNOSIS — Z8619 Personal history of other infectious and parasitic diseases: Secondary | ICD-10-CM | POA: Insufficient documentation

## 2013-08-22 DIAGNOSIS — M545 Low back pain, unspecified: Secondary | ICD-10-CM | POA: Insufficient documentation

## 2013-08-22 DIAGNOSIS — Z79899 Other long term (current) drug therapy: Secondary | ICD-10-CM | POA: Insufficient documentation

## 2013-08-22 DIAGNOSIS — Z91040 Latex allergy status: Secondary | ICD-10-CM | POA: Insufficient documentation

## 2013-08-22 DIAGNOSIS — Z8739 Personal history of other diseases of the musculoskeletal system and connective tissue: Secondary | ICD-10-CM | POA: Insufficient documentation

## 2013-08-22 DIAGNOSIS — G8929 Other chronic pain: Secondary | ICD-10-CM | POA: Insufficient documentation

## 2013-08-22 DIAGNOSIS — J45909 Unspecified asthma, uncomplicated: Secondary | ICD-10-CM | POA: Insufficient documentation

## 2013-08-22 DIAGNOSIS — M25559 Pain in unspecified hip: Secondary | ICD-10-CM | POA: Insufficient documentation

## 2013-08-22 DIAGNOSIS — F17200 Nicotine dependence, unspecified, uncomplicated: Secondary | ICD-10-CM | POA: Insufficient documentation

## 2013-08-22 DIAGNOSIS — M24559 Contracture, unspecified hip: Secondary | ICD-10-CM

## 2013-08-22 MED ORDER — OXYCODONE-ACETAMINOPHEN 5-325 MG PO TABS: 1.0000 | ORAL_TABLET | Freq: Every evening | ORAL | Status: DC | PRN

## 2013-08-22 NOTE — ED Notes (Signed)
Pt presents to department for evaluation of back pain. Ongoing x2 weeks. States chronic back pain since injury in high school. 10/10 pain at the time. Pt is alert and oriented x4. Denies recent injury.

## 2013-08-22 NOTE — ED Provider Notes (Signed)
CSN: [id number]     Arrival date & time 08/22/13  1001 History   First MD Initiated Contact with Patient 08/22/13 1011     Chief Complaint  Patient presents with  . Back Pain   (Consider location/radiation/quality/duration/timing/severity/associated sxs/prior Treatment) HPI Comments: History reports she has had nearly 2 weeks of low back pain with radiation into her bilateral buttock. Patient states that she was doing lots of bending and lifting right before the pain started. Pain is constant and worse with movement and palpation. Denies weakness or numbness of her legs. Denies fevers, chills, abdominal pain, urinary symptoms, GI symptoms, vaginal symptoms. Denies loss of control of bowel or bladder. Patient was seen in the emergency department on August 25 for the same thing. She has been taking ibuprofen, Robaxin, tramadol without any relief. She is also using hot pads and hot over-the-counter stickers without relief. She would like to have an x-ray of her lower back. Her primary care provider is Dr. Caryn Bee has not call for an appointment were seen her.  Patient is a 32 y.o. female presenting with back pain. The history is provided by the patient.  Back Pain Associated symptoms: no abdominal pain, no dysuria, no fever, no numbness and no weakness     Past Medical History  Diagnosis Date  . Irritable bowel   . Asthma     meds as needed  . Ectopic pregnancy     requiring surgery  . Gonorrhea   . Preterm labor   . Arthritis   . Chronic back pain    Past Surgical History  Procedure Laterality Date  . Wisdom tooth extraction    . Tubal ligation  2005  . Laparoscopy  05/30/2012    Procedure: LAPAROSCOPY OPERATIVE;  Surgeon: Tereso Newcomer, MD;  Location: [facility];  Service: Gynecology;  Laterality: N/A;  Operative laparoscopy right salpingectomy and removal of ectopic pregnancy  . Salpingectomy    . Laparoscopy for ectopic pregnancy     No family history on file. History  Substance  Use Topics  . Smoking status: Current Every Day Smoker    Types: Cigarettes  . Smokeless tobacco: Never Used  . Alcohol Use: Yes     Comment: occasional   OB History   Grav Para Term Preterm Abortions TAB SAB Ect Mult Living   6 3 2 1 2  1 1  3      Review of Systems  Constitutional: Negative for fever and chills.  Gastrointestinal: Negative for nausea, vomiting, abdominal pain and diarrhea.  Genitourinary: Negative for dysuria, urgency, frequency, vaginal bleeding and vaginal discharge.  Musculoskeletal: Positive for back pain.  Neurological: Negative for weakness and numbness.    Allergies  Hydrocodeine; Latex; and Vioxx  Home Medications   Current Outpatient Rx  Name  Route  Sig  Dispense  Refill  . ibuprofen (ADVIL,MOTRIN) 800 MG tablet   Oral   Take 800 mg by mouth every 8 (eight) hours as needed for pain.         . methocarbamol (ROBAXIN) 500 MG tablet   Oral   Take 1 tablet (500 mg total) by mouth 2 (two) times daily.   20 tablet   0   . traMADol (ULTRAM) 50 MG tablet   Oral   Take 1 tablet (50 mg total) by mouth every 6 (six) hours as needed for pain.   15 tablet   0    BP 168/99  Pulse 77  Temp(Src) 98 F (36.7 C) (Oral)  Resp 16  SpO2 98%  LMP 07/23/2013 Physical Exam  Nursing note and vitals reviewed. Constitutional: She appears well-developed and well-nourished. No distress.  HENT:  Head: Normocephalic and atraumatic.  Neck: Neck supple.  Pulmonary/Chest: Effort normal.  Abdominal: Soft. She exhibits no distension and no mass. There is no tenderness. There is no rebound and no guarding.  Musculoskeletal:       Arms:      Legs: Diffuse pain over her entire lower back with palpation over bilateral hip flexors. Straight leg raise reproduces the pain in the back but does not cause pain to radiate. Lower extremities:  Strength 5/5, sensation intact, distal pulses intact.   Spine without crepitus or stepoffs, no specific localized tenderness.      Neurological: She is alert.  Skin: She is not diaphoretic.    ED Course  Procedures (including critical care time) Labs Review Labs Reviewed - No data to display Imaging Review Dg Lumbar Spine Complete  08/22/2013   *RADIOLOGY REPORT*  Clinical Data: Back pain  LUMBAR SPINE - COMPLETE 4+ VIEW  Comparison: None.  Findings: Five lumbar type vertebral bodies are well visualized.  A scoliosis of the thoracolumbar spine is seen.  This may be positional in nature.  No spondylolysis or spondylolisthesis is seen.  IMPRESSION: No acute abnormality is noted.   Original Report Authenticated By: Alcide Clever, M.D.    MDM   1. Low back pain   2. Hip flexor tightness, unspecified laterality    Patient with nearly 2 weeks of lower back pain with radiation. This does not appear to be radiculopathy but actually appears to be muscular pain with additional hip flexor pain. This is likely due to strain from overuse during activity 11 days ago. Pain consistently only occurs with movement and palpation.  Doubt non-musculoskeletal source.  No red flags for back pain.  Patient insisted on having an x-ray despite my recommendations that I did not believe it would add any useful information and would be unnecessary radiation for the patient. However, as the patient has now been here twice in one week I will add the past. Xray is negative.  Pt /dc home with #8 percocet qHS PRN, PCP f/u.  Pt still has medications from earlier this week.  Discussed conservative measures for controlling back pain. Discussed all results with patient.  Pt given return precautions.  Pt verbalizes understanding and agrees with plan.       Trixie Dredge, PA-C 08/22/13 1410

## 2013-08-22 NOTE — ED Provider Notes (Signed)
Medical screening examination/treatment/procedure(s) were performed by non-physician practitioner and as supervising physician I was immediately available for consultation/collaboration.   Audree Camel, MD 08/22/13 [phone]

## 2013-10-28 ENCOUNTER — Other Ambulatory Visit: Payer: Self-pay

## 2013-12-07 ENCOUNTER — Encounter (HOSPITAL_COMMUNITY): Payer: Self-pay | Admitting: Emergency Medicine

## 2013-12-07 ENCOUNTER — Emergency Department (HOSPITAL_COMMUNITY)
Admission: EM | Admit: 2013-12-07 | Discharge: 2013-12-07 | Discharge status: Against Medical Advice | Payer: Medicaid Other | Attending: Emergency Medicine | Admitting: Emergency Medicine

## 2013-12-07 DIAGNOSIS — R109 Unspecified abdominal pain: Secondary | ICD-10-CM | POA: Insufficient documentation

## 2013-12-07 DIAGNOSIS — F17200 Nicotine dependence, unspecified, uncomplicated: Secondary | ICD-10-CM | POA: Insufficient documentation

## 2013-12-07 DIAGNOSIS — J45909 Unspecified asthma, uncomplicated: Secondary | ICD-10-CM | POA: Insufficient documentation

## 2013-12-07 DIAGNOSIS — Z3202 Encounter for pregnancy test, result negative: Secondary | ICD-10-CM | POA: Insufficient documentation

## 2013-12-07 LAB — CBC WITH DIFFERENTIAL/PLATELET
Basophils Absolute: 0 10*3/uL (ref 0.0–0.1)
Eosinophils Absolute: 0.3 10*3/uL (ref 0.0–0.7)
Eosinophils Relative: 3 % (ref 0–5)
MCH: 30.6 pg (ref 26.0–34.0)
MCHC: 33.8 g/dL (ref 30.0–36.0)
MCV: 90.7 fL (ref 78.0–100.0)
Monocytes Absolute: 0.9 10*3/uL (ref 0.1–1.0)
Platelets: 246 10*3/uL (ref 150–400)
RDW: 12.9 % (ref 11.5–15.5)

## 2013-12-07 LAB — COMPREHENSIVE METABOLIC PANEL
ALT: 27 U/L (ref 0–35)
AST: 22 U/L (ref 0–37)
Alkaline Phosphatase: 149 U/L — ABNORMAL HIGH (ref 39–117)
Calcium: 9 mg/dL (ref 8.4–10.5)
Creatinine, Ser: 0.63 mg/dL (ref 0.50–1.10)
GFR calc non Af Amer: >90 mL/min (ref >90)
Sodium: 138 mEq/L (ref 135–145)
Total Bilirubin: 0.1 mg/dL — ABNORMAL LOW (ref 0.3–1.2)
Total Protein: 7.1 g/dL (ref 6.0–8.3)

## 2013-12-07 LAB — LIPASE, BLOOD: Lipase: 64 U/L — ABNORMAL HIGH (ref 11–59)

## 2013-12-07 LAB — URINALYSIS, ROUTINE W REFLEX MICROSCOPIC
Bilirubin Urine: NEGATIVE
Glucose, UA: NEGATIVE mg/dL
Ketones, ur: NEGATIVE mg/dL
Nitrite: NEGATIVE
pH: 7.5 (ref 5.0–8.0)

## 2013-12-07 LAB — URINE MICROSCOPIC-ADD ON

## 2013-12-07 NOTE — ED Notes (Signed)
Severe abd. ~ nausea at times. Pain all over, but hurts on rt. Side. Not constipated. Normal BM's.

## 2014-03-07 ENCOUNTER — Ambulatory Visit (INDEPENDENT_AMBULATORY_CARE_PROVIDER_SITE_OTHER): Payer: Medicaid Other | Admitting: Neurology

## 2014-03-07 ENCOUNTER — Encounter: Payer: Self-pay | Admitting: Neurology

## 2014-03-07 VITALS — BP 129/92 | HR 76 | Ht 64.0 in | Wt 194.0 lb

## 2014-03-07 DIAGNOSIS — G43909 Migraine, unspecified, not intractable, without status migrainosus: Secondary | ICD-10-CM

## 2014-03-07 MED ORDER — RIZATRIPTAN BENZOATE 5 MG PO TBDP: 5.0000 mg | ORAL_TABLET | ORAL | Status: DC | PRN

## 2014-03-07 MED ORDER — PROPRANOLOL HCL 40 MG PO TABS: 40.0000 mg | ORAL_TABLET | Freq: Three times a day (TID) | ORAL | Status: DC

## 2014-03-07 NOTE — Progress Notes (Signed)
PATIENT: [name] DOB: 1981-11-13  HISTORICAL  [name] is a 33 years old right-handed African American female, referred by her primary care physician Dr. Criss Rosales for evaluation of headaches  Since 2005, she has headache associated with intercourse, she only has headaches after orgasm exploding, pounding, lasting 10-15 minutes, gradually subsided, over the past 6 months, she has worsening headaches, severe pounding headaches after intercourse, and also lingers, lasting 2-3 days. 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 Hydrocodeine [Dihydrocodeine] Hives  . Latex Hives  . Vioxx [Rofecoxib] Hives    HOME MEDICATIONS: Current Outpatient Prescriptions on File Prior to Visit  Medication Sig Dispense Refill  . buPROPion (WELLBUTRIN XL) 150 MG 24 hr tablet Take 150 mg by mouth 2 (two) times daily.      Marland Kitchen ibuprofen (ADVIL,MOTRIN) 800 MG tablet Take 800 mg by mouth every 8 (eight) hours as needed for pain.      . traMADol (ULTRAM) 50 MG tablet Take 1 tablet (50 mg total) by mouth every 6 (six) hours as needed for pain.  15 tablet  0   No current facility-administered medications on file prior to visit.    PAST MEDICAL HISTORY: Past Medical History  Diagnosis Date  . Irritable bowel   . Asthma     meds as  needed  . Ectopic pregnancy     requiring surgery  . Gonorrhea   . Preterm labor   . Arthritis   . Chronic back pain     PAST SURGICAL HISTORY: Past Surgical History  Procedure Laterality Date  . Wisdom tooth extraction    . Tubal ligation  2005  . Laparoscopy  05/30/2012    Procedure: LAPAROSCOPY OPERATIVE;  Surgeon: Osborne Oman, MD;  Location: [facility];  Service: Gynecology;  Laterality: N/A;  Operative laparoscopy right salpingectomy and removal of ectopic pregnancy  . Salpingectomy    . Laparoscopy for ectopic pregnancy      FAMILY HISTORY: History reviewed. No pertinent family history.  SOCIAL HISTORY:  History   Social History  . Marital Status: Married    Spouse Name: N/A    Number of Children: 2  . Years of Education: GED   Occupational History  .      [employer]    Social History Main Topics  . Smoking status: Current Every Day Smoker    Types: Cigarettes  . Smokeless tobacco: Never Used  . Alcohol Use: Yes     Comment: occasional  . Drug Use: No  . Sexual Activity: Yes    Birth Control/ Protection: Surgical   Other Topics Concern  . Not on file   Social History Narrative   Patient lives at home with her husband and children. Patient works full time at [employer].   Right handed   Education GED   One cup of caffeine daily  PHYSICAL EXAM   Filed Vitals:   03/07/14 0957  BP: 129/92  Pulse: 76  Height: 5\' 4"  (1.626 m)  Weight: 194 lb (87.998 kg)    Not recorded    Body mass index is 33.28 kg/(m^2).   Generalized: In no acute distress  Neck: Supple, no carotid bruits   Cardiac: Regular rate rhythm  Pulmonary: Clear to auscultation bilaterally  Musculoskeletal: No deformity  Neurological examination  Mentation: Alert oriented to time, place, history taking, and causual conversation  Cranial nerve II-XII: Pupils were equal round reactive to light. Extraocular movements were full.  Visual field were full on  confrontational test. Bilateral fundi were sharp.  Facial sensation and strength were normal. Hearing was intact to finger rubbing bilaterally. Uvula tongue midline.  Head turning and shoulder shrug and were normal and symmetric.Tongue protrusion into cheek strength was normal.  Motor: Normal tone, bulk and strength.  Sensory: Intact to fine touch, pinprick, preserved vibratory sensation, and proprioception at toes.  Coordination: Normal finger to nose, heel-to-shin bilaterally there was no truncal ataxia  Gait: Rising up from seated position without assistance, normal stance, without trunk ataxia, moderate stride, good arm swing, smooth turning, able to perform tiptoe, and heel walking without difficulty.   Romberg signs: Negative  Deep tendon reflexes: Brachioradialis 2/2, biceps 2/2, triceps 2/2, patellar 2/2, Achilles 2/2, plantar responses were flexor bilaterally.   DIAGNOSTIC DATA (LABS, IMAGING, TESTING) - I reviewed patient records, labs, notes, testing and imaging myself where available.  Lab Results  Component Value Date   WBC 8.5 12/07/2013   HGB 12.8 12/07/2013   HCT 37.9 12/07/2013   MCV 90.7 12/07/2013   PLT 246 12/07/2013      Component Value Date/Time   NA 138 12/07/2013 2135   K 3.9 12/07/2013 2135   CL 106 12/07/2013 2135   CO2 23 12/07/2013 2135   GLUCOSE 85 12/07/2013 2135   BUN 5* 12/07/2013 2135   CREATININE 0.63 12/07/2013 2135   CALCIUM 9.0 12/07/2013 2135   PROT 7.1 12/07/2013 2135   ALBUMIN 3.6 12/07/2013 2135   AST 22 12/07/2013 2135   ALT 27 12/07/2013 2135   ALKPHOS 149* 12/07/2013 2135   BILITOT 0.1* 12/07/2013 2135   GFRNONAA >90 12/07/2013 2135   GFRAA >90 12/07/2013 2135    ASSESSMENT AND PLAN  [name] is a 33 y.o. female complains of  Headache associated with orgasm, but her headaches lingers around, with migraine features, normal neurological examinations  1. MRI and MRA of the brain to rule out structural lesion 2.  Inderal 40 mg twice a day as preventive medications 3, Maxalt as needed 4. Return to clinic in 2 months   Marcial Pacas, M.D. Ph.D.  [facility] [address] [place], [place] [address] [phone]

## 2014-03-19 ENCOUNTER — Other Ambulatory Visit: Payer: Medicaid Other

## 2014-03-19 ENCOUNTER — Inpatient Hospital Stay: Admission: RE | Admit: 2014-03-19 | Payer: Medicaid Other | Source: Ambulatory Visit

## 2014-03-20 ENCOUNTER — Ambulatory Visit
Admission: RE | Admit: 2014-03-20 | Discharge: 2014-03-20 | Disposition: A | Discharge status: Home / Self Care | Payer: Medicaid Other | Source: Ambulatory Visit | Attending: Neurology | Admitting: Neurology

## 2014-03-20 DIAGNOSIS — G43909 Migraine, unspecified, not intractable, without status migrainosus: Secondary | ICD-10-CM

## 2014-03-20 DIAGNOSIS — R51 Headache: Secondary | ICD-10-CM

## 2014-03-22 NOTE — Progress Notes (Signed)
Quick Note:  Please advise patient that her brain MRA, which is the blood vessel test on her brain was reported as normal. Star Age, MD, PhD [facility] ([place])  ______

## 2014-05-09 ENCOUNTER — Ambulatory Visit (INDEPENDENT_AMBULATORY_CARE_PROVIDER_SITE_OTHER): Payer: Medicaid Other | Admitting: Neurology

## 2014-05-09 ENCOUNTER — Telehealth: Payer: Self-pay | Admitting: Neurology

## 2014-05-09 ENCOUNTER — Encounter: Payer: Self-pay | Admitting: Neurology

## 2014-05-09 VITALS — BP 135/82 | HR 73 | Ht 64.0 in | Wt 196.0 lb

## 2014-05-09 DIAGNOSIS — G43909 Migraine, unspecified, not intractable, without status migrainosus: Secondary | ICD-10-CM

## 2014-05-09 MED ORDER — PROPRANOLOL HCL 60 MG PO TABS: 60.0000 mg | ORAL_TABLET | Freq: Three times a day (TID) | ORAL | Status: DC

## 2014-05-09 MED ORDER — RIZATRIPTAN BENZOATE 10 MG PO TBDP: 10.0000 mg | ORAL_TABLET | ORAL | Status: DC | PRN

## 2014-05-09 MED ORDER — PROPRANOLOL HCL 60 MG PO TABS: 60.0000 mg | ORAL_TABLET | Freq: Two times a day (BID) | ORAL | Status: DC

## 2014-05-09 MED ORDER — NORTRIPTYLINE HCL 10 MG PO CAPS: ORAL_CAPSULE | ORAL | Status: DC

## 2014-05-09 MED ORDER — NORTRIPTYLINE HCL 10 MG PO CAPS
ORAL_CAPSULE | ORAL | Status: DC
Start: 2014-05-09 — End: 2014-07-10

## 2014-05-09 NOTE — Telephone Encounter (Signed)
Rx's have been resent to alternate pharmacy per patient request.

## 2014-05-09 NOTE — Telephone Encounter (Signed)
Patient at check out today states she wants the prescriptions that Dr. [name] gave her to be sent to the [facility], their number is [phone].

## 2014-05-09 NOTE — Progress Notes (Signed)
PATIENT: [name] DOB: 23-Aug-1981  HISTORICAL ( initial in March 2015)  KERIANNE [name] is a 33 years old right-handed African American female, referred by her primary care physician Dr. Criss Rosales for evaluation of headaches  Since 2005, she has headache associated with intercourse, she only has headaches after orgasm exploding, pounding, lasting 10-15 minutes, gradually subsided, over the past 6 months, she has worsening headaches, severe pounding headaches after intercourse, and also lingers, lasting 2-3 days. 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  She is now having headache more frequent, not associated with intercourse,  Almost daily, it will not go away, stabbing pain at her left occipital regions, left eye jumpy pain, lasting on and off all day.  She is taking ibuprofen 800 mg, Maxalt as needed, which only take only temporarily, headache usually come back the same day again she is also taking Inderal 40 mg as preventive medications, no significant side effect , REVIEW OF SYSTEMS: Full 14 system review of systems performed and notable only for weight gain, chest pain, been in years, spinning sensation, moles, blurry vision, double vision, shortness of breath, snoring, diarrhea, constipation, urination problems, flushing, joint pain, joint swelling, cramps, achy muscles, allergy, skin sensitivity, headaches, numbness, weakness, dizziness, depression, anxiety, not enough sleep, change in appetite  ALLERGIES: Allergies  Allergen Reactions  . Hydrocodeine [Dihydrocodeine] Hives  . Latex Hives  . Vioxx [Rofecoxib] Hives    HOME MEDICATIONS: Current Outpatient Prescriptions on File Prior to Visit  Medication Sig  Dispense Refill  . buPROPion (WELLBUTRIN XL) 150 MG 24 hr tablet Take 150 mg by mouth 2 (two) times daily.      Marland Kitchen ibuprofen (ADVIL,MOTRIN) 800 MG tablet Take 800 mg by mouth every 8 (eight) hours as needed for pain.      Marland Kitchen propranolol (INDERAL) 40 MG tablet Take 1 tablet (40 mg total) by mouth 3 (three) times daily.  60 tablet  12  . rizatriptan (MAXALT-MLT) 5 MG disintegrating tablet Take 1 tablet (5 mg total) by mouth as needed for migraine. May repeat in 2 hours if needed  15 tablet  12  . traMADol (ULTRAM) 50 MG tablet Take 1 tablet (50 mg total) by mouth every 6 (six) hours as needed for pain.  15 tablet  0   No current facility-administered medications on file prior to visit.    PAST MEDICAL HISTORY: Past Medical History  Diagnosis Date  . Irritable bowel   . Asthma     meds as needed  . Ectopic pregnancy     requiring surgery  . Gonorrhea   . Preterm labor   . Arthritis   . Chronic back pain     PAST SURGICAL HISTORY: Past Surgical History  Procedure Laterality Date  . Wisdom tooth extraction    . Tubal ligation  2005  . Laparoscopy  05/30/2012    Procedure: LAPAROSCOPY OPERATIVE;  Surgeon: Osborne Oman, MD;  Location: [facility];  Service: Gynecology;  Laterality: N/A;  Operative laparoscopy right salpingectomy and removal of ectopic pregnancy  . Salpingectomy    . Laparoscopy for ectopic pregnancy      FAMILY HISTORY: History reviewed. No pertinent family history.  SOCIAL HISTORY:  History   Social History  .  Marital Status: Married    Spouse Name: N/A    Number of Children: 2  . Years of Education: GED   Occupational History  .      [employer]    Social History Main Topics  . Smoking status: Current Every Day Smoker    Types: Cigarettes  . Smokeless tobacco: Never Used  . Alcohol Use: Yes     Comment: occasional  . Drug Use: No  . Sexual Activity: Yes    Birth Control/ Protection: Surgical   Other Topics Concern  . Not on file   Social History  Narrative   Patient lives at home with her husband and children. Patient works full time at [employer].   Right handed   Education GED   One cup of caffeine daily              PHYSICAL EXAM   Filed Vitals:   05/09/14 1109  BP: 135/82  Pulse: 73  Height: 5\' 4"  (1.626 m)  Weight: 196 lb (88.905 kg)    Not recorded    Body mass index is 33.63 kg/(m^2).   Generalized: In no acute distress  Neck: Supple, no carotid bruits   Cardiac: Regular rate rhythm  Pulmonary: Clear to auscultation bilaterally  Musculoskeletal: No deformity  Neurological examination  Mentation: Alert oriented to time, place, history taking, and causual conversation  Cranial nerve II-XII: Pupils were equal round reactive to light. Extraocular movements were full.  Visual field were full on confrontational test. Bilateral fundi were sharp.  Facial sensation and strength were normal. Hearing was intact to finger rubbing bilaterally. Uvula tongue midline.  Head turning and shoulder shrug and were normal and symmetric.Tongue protrusion into cheek strength was normal.  Motor: Normal tone, bulk and strength.  Sensory: Intact to fine touch, pinprick, preserved vibratory sensation, and proprioception at toes.  Coordination: Normal finger to nose, heel-to-shin bilaterally there was no truncal ataxia  Gait: Rising up from seated position without assistance, normal stance, without trunk ataxia, moderate stride, good arm swing, smooth turning, able to perform tiptoe, and heel walking without difficulty.   Romberg signs: Negative  Deep tendon reflexes: Brachioradialis 2/2, biceps 2/2, triceps 2/2, patellar 2/2, Achilles 2/2, plantar responses were flexor bilaterally.   DIAGNOSTIC DATA (LABS, IMAGING, TESTING) - I reviewed patient records, labs, notes, testing and imaging myself where available.  Lab Results  Component Value Date   WBC 8.5 12/07/2013   HGB 12.8 12/07/2013   HCT 37.9 12/07/2013   MCV 90.7  12/07/2013   PLT 246 12/07/2013      Component Value Date/Time   NA 138 12/07/2013 2135   K 3.9 12/07/2013 2135   CL 106 12/07/2013 2135   CO2 23 12/07/2013 2135   GLUCOSE 85 12/07/2013 2135   BUN 5* 12/07/2013 2135   CREATININE 0.63 12/07/2013 2135   CALCIUM 9.0 12/07/2013 2135   PROT 7.1 12/07/2013 2135   ALBUMIN 3.6 12/07/2013 2135   AST 22 12/07/2013 2135   ALT 27 12/07/2013 2135   ALKPHOS 149* 12/07/2013 2135   BILITOT 0.1* 12/07/2013 2135   GFRNONAA >90 12/07/2013 2135   GFRAA >90 12/07/2013 2135    ASSESSMENT AND PLAN  SKILYNN DURNEY is a 33 y.o. female complains of  Headache initially only associated with orgasm, but her headaches  now become lingers around, with migraine features, normal neurological examinations  1.  increase  Inderal  to 60 mg twice a day as preventive medications 3, Maxalt  10 mg  as needed, May take it together with ibuprofen.  4. Return to clinic with Hoyle Sauer in3  months   Marcial Pacas, M.D. Ph.D.  [facility] [address] [place], [place] [address] [phone]

## 2014-05-12 ENCOUNTER — Encounter (HOSPITAL_COMMUNITY): Payer: Self-pay | Admitting: Emergency Medicine

## 2014-05-12 ENCOUNTER — Emergency Department (HOSPITAL_COMMUNITY)
Admission: EM | Admit: 2014-05-12 | Discharge: 2014-05-12 | Disposition: A | Discharge status: Home / Self Care | Payer: Medicaid Other | Source: Home / Self Care | Attending: Emergency Medicine | Admitting: Emergency Medicine

## 2014-05-12 DIAGNOSIS — G629 Polyneuropathy, unspecified: Secondary | ICD-10-CM

## 2014-05-12 DIAGNOSIS — G589 Mononeuropathy, unspecified: Secondary | ICD-10-CM

## 2014-05-12 LAB — POCT I-STAT, CHEM 8
BUN: 3 mg/dL — AB (ref 6–23)
CHLORIDE: 106 meq/L (ref 96–112)
CREATININE: 0.7 mg/dL (ref 0.50–1.10)
Calcium, Ion: 1.2 mmol/L (ref 1.12–1.23)
GLUCOSE: 88 mg/dL (ref 70–99)
HCT: 37 % (ref 36.0–46.0)
Hemoglobin: 12.6 g/dL (ref 12.0–15.0)
Potassium: 3.7 mEq/L (ref 3.7–5.3)
SODIUM: 143 meq/L (ref 137–147)
TCO2: 22 mmol/L (ref 0–100)

## 2014-05-12 MED ORDER — TRAMADOL HCL 50 MG PO TABS: 100.0000 mg | ORAL_TABLET | Freq: Three times a day (TID) | ORAL | Status: DC | PRN

## 2014-05-12 MED ORDER — GABAPENTIN 300 MG PO CAPS: ORAL_CAPSULE | ORAL | Status: DC

## 2014-05-12 NOTE — Discharge Instructions (Signed)

## 2014-05-12 NOTE — ED Provider Notes (Signed)
Chief Complaint   Chief Complaint  Patient presents with  . Extremity Weakness    History of Present Illness   [name] is a 33 year old female who has had a three-day history of burning and hot sensation of both of her feet. Her legs feel weak and her feet feel tight. She denies any objective swelling or color change. She also notes numbness of her fingers extending up to the elbows for the past one to 2 weeks. Her hands feel swollen and her arms feel weak. Also for the past 3 days she's had dry mouth, polyuria, and polydipsia. She also has burning dysesthesia of the left side of the neck today which radiates down her left arm. She's had mild shortness of breath, nausea, and some sweats. She has an appointment next Tuesday with her primary care physician. The patient states she's had episodes of passing out for the past 11 years. This has occurred 3 times in the past 11 years. It tends to occur after prolonged standing. The patient feels dizzy and lightheaded and her weight has been increasing. There is a family history of diabetes. She notes twitching of her eyes and some palpitations. She denies any fever, chills, headache, diplopia, difficulty speaking or swallowing, stiff neck, chest pain, abdominal pain, nausea, vomiting, diarrhea, or urinary symptoms. She has no itching or skin rash.  Review of Systems     Other than as noted above, the patient denies any of the following symptoms: Systemic:  No fever, chills, fatigue, myalgias, headache, or anorexia. Eye:  No redness, pain or drainage. ENT:  No earache, nasal congestion, rhinorrhea, sinus pressure, or sore throat. Lungs:  No cough, sputum production, wheezing, shortness of breath.  Cardiovascular:  No chest pain, palpitations, or syncope. GI:  No nausea, vomiting, abdominal pain or diarrhea. GU:  No dysuria, frequency, or hematuria. Skin:  No rash or pruritis.   [place]     Past medical history, family history, social  history, meds, and allergies were reviewed.    Physical Examination    Vital signs:  BP 132/76  Pulse 78  Temp(Src) 98.6 F (37 C) (Oral)  Resp 14  SpO2 100% General:  Alert, in no distress. Eye:  PERRL, full EOMs.  Lids and conjunctivas were normal. ENT:  TMs and canals were normal, without erythema or inflammation.  Nasal mucosa was clear and uncongested, without drainage.  Mucous membranes were moist.  Pharynx was clear, without exudate or drainage.  There were no oral ulcerations or lesions. Neck:  Supple, no adenopathy, tenderness or mass. Thyroid was normal. Lungs:  No respiratory distress.  Lungs were clear to auscultation, without wheezes, rales or rhonchi.  Breath sounds were clear and equal bilaterally. Heart:  Regular rhythm, without gallops, murmers or rubs. Abdomen:  Soft, flat, and non-tender to palpation.  No hepatosplenomagaly or mass. Extremities: No edema, no erythema the feet, temperature feels normal, pulses are full. Neurological examination: The patient is alert and oriented x3. Speech is clear, fluent, and appropriate. Cranial nerves are intact. There is no pronator drift and finger to nose was normal. Muscle strength, sensation, and DTRs are normal with 2+ reflexes for biceps, triceps, brachial radialis, knees, and ankles. Babinskis are downgoing. Station and gait were normal. Romberg sign is negative, patient is able to perform tandem gait well. Skin:  Clear, warm, and dry, without rash or lesions.  Labs    Results for orders placed during the hospital encounter of 05/12/14  POCT I-STAT, CHEM 8  Result Value Ref Range   Sodium 143  137 - 147 mEq/L   Potassium 3.7  3.7 - 5.3 mEq/L   Chloride 106  96 - 112 mEq/L   BUN 3 (*) 6 - 23 mg/dL   Creatinine, Ser 0.70  0.50 - 1.10 mg/dL   Glucose, Bld 88  70 - 99 mg/dL   Calcium, Ion 1.20  1.12 - 1.23 mmol/L   TCO2 22  0 - 100 mmol/L   Hemoglobin 12.6  12.0 - 15.0 g/dL   HCT 37.0  36.0 - 46.0 %     EKG Results:   Date: 05/12/2014  Rate: 64  Rhythm: normal sinus rhythm and sinus arrhythmia  QRS Axis: normal  Intervals: normal  ST/T Wave abnormalities: normal  Conduction Disutrbances:none  Narrative Interpretation: Normal sinus rhythm with sinus arrhythmia, normal EKG.  Old EKG Reviewed: none available   Assessment   The encounter diagnosis was Neuropathy.  Peripheral neuropathy of uncertain cause. Does not appear to be diabetic in nature. She's not a drinker, so this does not alcohol or. It could be vitamin B12 deficiency, entrapment neuropathy, or multiple sclerosis. There is no evidence of Guillain-Barr syndrome since her reflexes are normal.  Plan     1.  Meds:  The following meds were prescribed:   Discharge Medication List as of 05/12/2014  4:26 PM    START taking these medications   Details  gabapentin (NEURONTIN) 300 MG capsule 1 daily for 3 days, then 1 BID, then 1 TID, Normal    !! traMADol (ULTRAM) 50 MG tablet Take 2 tablets (100 mg total) by mouth every 8 (eight) hours as needed., Starting 05/12/2014, Until Discontinued, Normal     !! - Potential duplicate medications found. Please discuss with provider.      2.  Patient Education/Counseling:  The patient was given appropriate handouts, self care instructions, and instructed in symptomatic relief.  Will need further evaluation by her primary care physician or neurologist. In the meantime she was given symptomatic treatment with gabapentin and tramadol.  3.  Follow up:  The patient was told to follow up here if no better in 3 to 4 days, or sooner if becoming worse in any way, and given some red flag symptoms such as worsening weakness or neurological symptoms which would prompt immediate return.  Follow up with her primary care physician next week.        Harden Mo, MD 05/12/14 2229

## 2014-05-12 NOTE — ED Notes (Signed)
Pt  Reports numbness  And  Tingling  l  Arm  From  Neck  Down           As  Well  As  Dry mouth  And  Feet  Burning  As  Well          Pt  Reports  The  Symptoms  For  About  3     Days

## 2014-07-10 ENCOUNTER — Emergency Department (HOSPITAL_COMMUNITY)
Admission: EM | Admit: 2014-07-10 | Discharge: 2014-07-10 | Disposition: A | Discharge status: Home / Self Care | Payer: Medicaid Other | Attending: Emergency Medicine | Admitting: Emergency Medicine

## 2014-07-10 ENCOUNTER — Encounter (HOSPITAL_COMMUNITY): Payer: Self-pay | Admitting: Emergency Medicine

## 2014-07-10 ENCOUNTER — Emergency Department (HOSPITAL_COMMUNITY): Payer: Medicaid Other

## 2014-07-10 DIAGNOSIS — R0602 Shortness of breath: Secondary | ICD-10-CM | POA: Diagnosis present

## 2014-07-10 DIAGNOSIS — IMO0001 Reserved for inherently not codable concepts without codable children: Secondary | ICD-10-CM | POA: Insufficient documentation

## 2014-07-10 DIAGNOSIS — K589 Irritable bowel syndrome without diarrhea: Secondary | ICD-10-CM | POA: Insufficient documentation

## 2014-07-10 DIAGNOSIS — G8929 Other chronic pain: Secondary | ICD-10-CM | POA: Diagnosis not present

## 2014-07-10 DIAGNOSIS — J45909 Unspecified asthma, uncomplicated: Secondary | ICD-10-CM | POA: Insufficient documentation

## 2014-07-10 DIAGNOSIS — Z8619 Personal history of other infectious and parasitic diseases: Secondary | ICD-10-CM | POA: Insufficient documentation

## 2014-07-10 DIAGNOSIS — F17200 Nicotine dependence, unspecified, uncomplicated: Secondary | ICD-10-CM | POA: Diagnosis not present

## 2014-07-10 DIAGNOSIS — M791 Myalgia, unspecified site: Secondary | ICD-10-CM

## 2014-07-10 DIAGNOSIS — J069 Acute upper respiratory infection, unspecified: Secondary | ICD-10-CM | POA: Diagnosis not present

## 2014-07-10 DIAGNOSIS — Z91040 Latex allergy status: Secondary | ICD-10-CM | POA: Insufficient documentation

## 2014-07-10 LAB — CBC WITH DIFFERENTIAL/PLATELET
Basophils Absolute: 0 10*3/uL (ref 0.0–0.1)
Basophils Relative: 0 % (ref 0–1)
EOS PCT: 3 % (ref 0–5)
Eosinophils Absolute: 0.2 10*3/uL (ref 0.0–0.7)
HEMATOCRIT: 35.7 % — AB (ref 36.0–46.0)
Hemoglobin: 12 g/dL (ref 12.0–15.0)
Lymphocytes Relative: 42 % (ref 12–46)
Lymphs Abs: 3 10*3/uL (ref 0.7–4.0)
MCH: 30.5 pg (ref 26.0–34.0)
MCHC: 33.6 g/dL (ref 30.0–36.0)
MCV: 90.6 fL (ref 78.0–100.0)
Monocytes Absolute: 0.7 10*3/uL (ref 0.1–1.0)
Monocytes Relative: 10 % (ref 3–12)
Neutro Abs: 3.2 10*3/uL (ref 1.7–7.7)
Neutrophils Relative %: 45 % (ref 43–77)
Platelets: 215 10*3/uL (ref 150–400)
RBC: 3.94 MIL/uL (ref 3.87–5.11)
RDW: 12.7 % (ref 11.5–15.5)
WBC: 7.2 10*3/uL (ref 4.0–10.5)

## 2014-07-10 LAB — BASIC METABOLIC PANEL
Anion gap: 14 (ref 5–15)
BUN: 6 mg/dL (ref 6–23)
CALCIUM: 8.4 mg/dL (ref 8.4–10.5)
CO2: 22 meq/L (ref 19–32)
Chloride: 106 mEq/L (ref 96–112)
Creatinine, Ser: 0.64 mg/dL (ref 0.50–1.10)
GFR calc Af Amer: >90 mL/min (ref >90)
GFR calc non Af Amer: >90 mL/min (ref >90)
Glucose, Bld: 104 mg/dL — ABNORMAL HIGH (ref 70–99)
Potassium: 4.1 mEq/L (ref 3.7–5.3)
SODIUM: 142 meq/L (ref 137–147)

## 2014-07-10 LAB — I-STAT TROPONIN, ED: Troponin i, poc: 0 ng/mL (ref 0.00–0.08)

## 2014-07-10 MED ORDER — AZITHROMYCIN 250 MG PO TABS
500.0000 mg | ORAL_TABLET | Freq: Once | ORAL | Status: AC
  Administered 2014-07-10: 500 mg via ORAL
  Filled 2014-07-10: qty 2

## 2014-07-10 MED ORDER — KETOROLAC TROMETHAMINE 30 MG/ML IJ SOLN
30.0000 mg | Freq: Once | INTRAMUSCULAR | Status: AC
  Administered 2014-07-10: 30 mg via INTRAVENOUS
  Filled 2014-07-10: qty 1

## 2014-07-10 MED ORDER — AZITHROMYCIN 250 MG PO TABS: 250.0000 mg | ORAL_TABLET | Freq: Once | ORAL | Status: DC

## 2014-07-10 MED ORDER — SODIUM CHLORIDE 0.9 % IV BOLUS (SEPSIS)
1000.0000 mL | Freq: Once | INTRAVENOUS | Status: AC
  Administered 2014-07-10: 1000 mL via INTRAVENOUS

## 2014-07-10 NOTE — ED Provider Notes (Signed)
TIME SEEN: 9:40 PM  CHIEF COMPLAINT: Body aches, chills, cough  HPI: Patient is a 33 y.o. F with history of asthma who presents emergency department with chills, nonproductive cough, body aches started today. Her 3 children were recently diagnosed with bacterial pneumonia and treated with antibiotics. She states her husband also has had similar symptoms. Denies any chest pain, vomiting or diarrhea, rash. No tick bite. No recent travel. No shortness of breath. No chest pain. History of PE or DVT, recent prolonged immobilization, fracture, trauma, surgery, exogenous hormone use.  No lower extremity swelling or pain.  ROS: See HPI Constitutional: no fever  Eyes: no drainage  ENT: no runny nose   Cardiovascular:  no chest pain  Resp: no SOB  GI: no vomiting GU: no dysuria Integumentary: no rash  Allergy: no hives  Musculoskeletal: no leg swelling  Neurological: no slurred speech ROS otherwise negative  PAST MEDICAL HISTORY/PAST SURGICAL HISTORY:  Past Medical History  Diagnosis Date  . Irritable bowel   . Asthma     meds as needed  . Ectopic pregnancy     requiring surgery  . Gonorrhea   . Preterm labor   . Arthritis   . Chronic back pain     MEDICATIONS:  Prior to Admission medications   Medication Sig Start Date End Date Taking? Authorizing Provider  buPROPion (WELLBUTRIN XL) 150 MG 24 hr tablet Take 150 mg by mouth 2 (two) times daily.    Historical Provider, MD  gabapentin (NEURONTIN) 300 MG capsule 1 daily for 3 days, then 1 BID, then 1 TID 05/12/14   Harden Mo, MD  ibuprofen (ADVIL,MOTRIN) 800 MG tablet Take 800 mg by mouth every 8 (eight) hours as needed for pain.    Historical Provider, MD  nortriptyline (PAMELOR) 10 MG capsule One po qhs xone week, then 2 tabs po qhs 05/09/14   Marcial Pacas, MD  propranolol (INDERAL) 60 MG tablet Take 1 tablet (60 mg total) by mouth 2 (two) times daily. 05/09/14   Marcial Pacas, MD  rizatriptan (MAXALT-MLT) 10 MG disintegrating tablet Take 1  tablet (10 mg total) by mouth as needed for migraine. May repeat in 2 hours if needed 05/09/14   Marcial Pacas, MD  traMADol (ULTRAM) 50 MG tablet Take 1 tablet (50 mg total) by mouth every 6 (six) hours as needed for pain. 08/16/13   Heather Laisure, PA-C  traMADol (ULTRAM) 50 MG tablet Take 2 tablets (100 mg total) by mouth every 8 (eight) hours as needed. 05/12/14   Harden Mo, MD    ALLERGIES:  Allergies  Allergen Reactions  . Hydrocodeine [Dihydrocodeine] Hives  . Latex Hives  . Vioxx [Rofecoxib] Hives    SOCIAL HISTORY:  History  Substance Use Topics  . Smoking status: Current Every Day Smoker    Types: Cigarettes  . Smokeless tobacco: Never Used  . Alcohol Use: Yes     Comment: occasional    FAMILY HISTORY: No family history on file.  EXAM: BP 139/84  Pulse 84  Temp(Src) 98.4 F (36.9 C)  Resp 22  Ht 5\' 4"  (1.626 m)  Wt 194 lb (87.998 kg)  BMI 33.28 kg/m2  SpO2 98%  LMP 06/26/2014 CONSTITUTIONAL: Alert and oriented and responds appropriately to questions. Well-appearing; well-nourished, pleasant, laughing HEAD: Normocephalic EYES: Conjunctivae clear, PERRL ENT: normal nose; no rhinorrhea; moist mucous membranes; pharynx without lesions noted NECK: Supple, no meningismus, no LAD  CARD: RRR; S1 and S2 appreciated; no murmurs, no clicks, no rubs, no  gallops RESP: Normal chest excursion without splinting or tachypnea; breath sounds clear and equal bilaterally; no wheezes, no rhonchi, no rales,  ABD/GI: Normal bowel sounds; non-distended; soft, non-tender, no rebound, no guarding BACK:  The back appears normal and is non-tender to palpation, there is no CVA tenderness EXT: Normal ROM in all joints; non-tender to palpation; no edema; normal capillary refill; no cyanosis    SKIN: Normal color for age and race; warm NEURO: Moves all extremities equally PSYCH: The patient's mood and manner are appropriate. Grooming and personal hygiene are appropriate.  MEDICAL DECISION  MAKING: Patient here with cough, myalgias, chills. Labs are unremarkable including negative troponin and normal EKG. Low suspicion for pulmonary embolus as she is PERC negative.  Suspect URI. Given her children had similar symptoms or treated with antibiotics for bacterial pneumonia, will treat patient with azithromycin. I feel she is safe to be discharged home. Have discussed with her alternate, on Motrin as needed for fever and pain. Have discussed return precautions. She verbalized understanding and is comfortable with plan.       EKG Interpretation  Date/Time:  Sunday July 10 2014 22:15:37 EDT Ventricular Rate:  79 PR Interval:  140 QRS Duration: 76 QT Interval:  389 QTC Calculation: 446 R Axis:   69 Text Interpretation:  Sinus rhythm Probable left atrial enlargement Confirmed by Lason Eveland,  DO, Kento Gossman ([id number]) on 07/10/2014 10:20:08 PM         [place], DO 07/10/14 2314

## 2014-07-10 NOTE — ED Notes (Signed)
The pt is c/o sob and pain when she breathes no temp cold cough.  Non-productive cough.  Her children have been diagnosed with pneumonia and she was told to come get checked

## 2014-07-10 NOTE — Discharge Instructions (Signed)
You may alternate Tylenol 1000 mg and ibuprofen 800 mg but over the counter as needed for fever and pain. Your labs today were normal and your chest x-ray showed no obvious pneumonia. Given your children recently had bacterial pneumonia, we are treating you with antibiotics.   Upper Respiratory Infection, Adult An upper respiratory infection (URI) is also sometimes known as the common cold. The upper respiratory tract includes the nose, sinuses, throat, trachea, and bronchi. Bronchi are the airways leading to the lungs. Most people improve within 1 week, but symptoms can last up to 2 weeks. A residual cough may last even longer.  CAUSES Many different viruses can infect the tissues lining the upper respiratory tract. The tissues become irritated and inflamed and often become very moist. Mucus production is also common. A cold is contagious. You can easily spread the virus to others by oral contact. This includes kissing, sharing a glass, coughing, or sneezing. Touching your mouth or nose and then touching a surface, which is then touched by another person, can also spread the virus. SYMPTOMS  Symptoms typically develop 1 to 3 days after you come in contact with a cold virus. Symptoms vary from person to person. They may include:  Runny nose.  Sneezing.  Nasal congestion.  Sinus irritation.  Sore throat.  Loss of voice (laryngitis).  Cough.  Fatigue.  Muscle aches.  Loss of appetite.  Headache.  Low-grade fever. DIAGNOSIS  You might diagnose your own cold based on familiar symptoms, since most people get a cold 2 to 3 times a year. Your caregiver can confirm this based on your exam. Most importantly, your caregiver can check that your symptoms are not due to another disease such as strep throat, sinusitis, pneumonia, asthma, or epiglottitis. Blood tests, throat tests, and X-rays are not necessary to diagnose a common cold, but they may sometimes be helpful in excluding other more  serious diseases. Your caregiver will decide if any further tests are required. RISKS AND COMPLICATIONS  You may be at risk for a more severe case of the common cold if you smoke cigarettes, have chronic heart disease (such as heart failure) or lung disease (such as asthma), or if you have a weakened immune system. The very young and very old are also at risk for more serious infections. Bacterial sinusitis, middle ear infections, and bacterial pneumonia can complicate the common cold. The common cold can worsen asthma and chronic obstructive pulmonary disease (COPD). Sometimes, these complications can require emergency medical care and may be life-threatening. PREVENTION  The best way to protect against getting a cold is to practice good hygiene. Avoid oral or hand contact with people with cold symptoms. Wash your hands often if contact occurs. There is no clear evidence that vitamin C, vitamin E, echinacea, or exercise reduces the chance of developing a cold. However, it is always recommended to get plenty of rest and practice good nutrition. TREATMENT  Treatment is directed at relieving symptoms. There is no cure. Antibiotics are not effective, because the infection is caused by a virus, not by bacteria. Treatment may include:  Increased fluid intake. Sports drinks offer valuable electrolytes, sugars, and fluids.  Breathing heated mist or steam (vaporizer or shower).  Eating chicken soup or other clear broths, and maintaining good nutrition.  Getting plenty of rest.  Using gargles or lozenges for comfort.  Controlling fevers with ibuprofen or acetaminophen as directed by your caregiver.  Increasing usage of your inhaler if you have asthma. Zinc gel and  zinc lozenges, taken in the first 24 hours of the common cold, can shorten the duration and lessen the severity of symptoms. Pain medicines may help with fever, muscle aches, and throat pain. A variety of non-prescription medicines are  available to treat congestion and runny nose. Your caregiver can make recommendations and may suggest nasal or lung inhalers for other symptoms.  HOME CARE INSTRUCTIONS   Only take over-the-counter or prescription medicines for pain, discomfort, or fever as directed by your caregiver.  Use a warm mist humidifier or inhale steam from a shower to increase air moisture. This may keep secretions moist and make it easier to breathe.  Drink enough water and fluids to keep your urine clear or pale yellow.  Rest as needed.  Return to work when your temperature has returned to normal or as your caregiver advises. You may need to stay home longer to avoid infecting others. You can also use a face mask and careful hand washing to prevent spread of the virus. SEEK MEDICAL CARE IF:   After the first few days, you feel you are getting worse rather than better.  You need your caregiver's advice about medicines to control symptoms.  You develop chills, worsening shortness of breath, or brown or red sputum. These may be signs of pneumonia.  You develop yellow or brown nasal discharge or pain in the face, especially when you bend forward. These may be signs of sinusitis.  You develop a fever, swollen neck glands, pain with swallowing, or white areas in the back of your throat. These may be signs of strep throat. SEEK IMMEDIATE MEDICAL CARE IF:   You have a fever.  You develop severe or persistent headache, ear pain, sinus pain, or chest pain.  You develop wheezing, a prolonged cough, cough up blood, or have a change in your usual mucus (if you have chronic lung disease).  You develop sore muscles or a stiff neck. Document Released: 06/04/2001 Document Revised: 03/02/2012 Document Reviewed: 04/12/2011 [facility] Patient Information 2015 [place], [place]. This information is not intended to replace advice given to you by your health care provider. Make sure you discuss any questions you have with your  health care provider.

## 2014-07-10 NOTE — ED Notes (Signed)
Dr. Ward at bedside.

## 2014-08-23 ENCOUNTER — Ambulatory Visit: Payer: Medicaid Other | Admitting: Nurse Practitioner

## 2014-08-23 ENCOUNTER — Telehealth: Payer: Self-pay | Admitting: Nurse Practitioner

## 2014-08-23 NOTE — Telephone Encounter (Signed)
No show for scheduled appt 

## 2014-08-25 ENCOUNTER — Encounter (HOSPITAL_COMMUNITY): Payer: Self-pay | Admitting: Emergency Medicine

## 2014-08-25 ENCOUNTER — Emergency Department (HOSPITAL_COMMUNITY)
Admission: EM | Admit: 2014-08-25 | Discharge: 2014-08-25 | Disposition: A | Discharge status: Home / Self Care | Payer: Medicaid Other | Attending: Emergency Medicine | Admitting: Emergency Medicine

## 2014-08-25 DIAGNOSIS — G8929 Other chronic pain: Secondary | ICD-10-CM | POA: Insufficient documentation

## 2014-08-25 DIAGNOSIS — N949 Unspecified condition associated with female genital organs and menstrual cycle: Secondary | ICD-10-CM | POA: Insufficient documentation

## 2014-08-25 DIAGNOSIS — Z8619 Personal history of other infectious and parasitic diseases: Secondary | ICD-10-CM | POA: Diagnosis not present

## 2014-08-25 DIAGNOSIS — N898 Other specified noninflammatory disorders of vagina: Secondary | ICD-10-CM | POA: Insufficient documentation

## 2014-08-25 DIAGNOSIS — M129 Arthropathy, unspecified: Secondary | ICD-10-CM | POA: Insufficient documentation

## 2014-08-25 DIAGNOSIS — Z8719 Personal history of other diseases of the digestive system: Secondary | ICD-10-CM | POA: Diagnosis not present

## 2014-08-25 DIAGNOSIS — J45909 Unspecified asthma, uncomplicated: Secondary | ICD-10-CM | POA: Insufficient documentation

## 2014-08-25 DIAGNOSIS — Z79899 Other long term (current) drug therapy: Secondary | ICD-10-CM | POA: Insufficient documentation

## 2014-08-25 DIAGNOSIS — IMO0002 Reserved for concepts with insufficient information to code with codable children: Secondary | ICD-10-CM | POA: Diagnosis not present

## 2014-08-25 DIAGNOSIS — Z3202 Encounter for pregnancy test, result negative: Secondary | ICD-10-CM | POA: Diagnosis not present

## 2014-08-25 DIAGNOSIS — Z91040 Latex allergy status: Secondary | ICD-10-CM | POA: Diagnosis not present

## 2014-08-25 DIAGNOSIS — F17200 Nicotine dependence, unspecified, uncomplicated: Secondary | ICD-10-CM | POA: Insufficient documentation

## 2014-08-25 DIAGNOSIS — R21 Rash and other nonspecific skin eruption: Secondary | ICD-10-CM | POA: Insufficient documentation

## 2014-08-25 DIAGNOSIS — R109 Unspecified abdominal pain: Secondary | ICD-10-CM | POA: Insufficient documentation

## 2014-08-25 LAB — URINALYSIS, ROUTINE W REFLEX MICROSCOPIC
Bilirubin Urine: NEGATIVE
Glucose, UA: NEGATIVE mg/dL
Ketones, ur: NEGATIVE mg/dL
Nitrite: NEGATIVE
Protein, ur: NEGATIVE mg/dL
Specific Gravity, Urine: 1.019 (ref 1.005–1.030)
Urobilinogen, UA: 1 mg/dL (ref 0.0–1.0)
pH: 7.5 (ref 5.0–8.0)

## 2014-08-25 LAB — URINE MICROSCOPIC-ADD ON

## 2014-08-25 LAB — WET PREP, GENITAL
Trich, Wet Prep: NONE SEEN
Yeast Wet Prep HPF POC: NONE SEEN

## 2014-08-25 LAB — PREGNANCY, URINE: Preg Test, Ur: NEGATIVE

## 2014-08-25 MED ORDER — DOXYCYCLINE HYCLATE 100 MG PO CAPS: 100.0000 mg | ORAL_CAPSULE | Freq: Two times a day (BID) | ORAL | Status: DC

## 2014-08-25 MED ORDER — LIDOCAINE HCL (PF) 1 % IJ SOLN
INTRAMUSCULAR | Status: AC
  Filled 2014-08-25: qty 5

## 2014-08-25 MED ORDER — AZITHROMYCIN 250 MG PO TABS
1000.0000 mg | ORAL_TABLET | Freq: Once | ORAL | Status: AC
  Administered 2014-08-25: 1000 mg via ORAL
  Filled 2014-08-25: qty 4

## 2014-08-25 MED ORDER — LIDOCAINE HCL (PF) 1 % IJ SOLN
1.0000 mL | Freq: Once | INTRAMUSCULAR | Status: AC
  Administered 2014-08-25: 1 mL

## 2014-08-25 MED ORDER — CEFTRIAXONE SODIUM 250 MG IJ SOLR
250.0000 mg | Freq: Once | INTRAMUSCULAR | Status: AC
  Administered 2014-08-25: 250 mg via INTRAMUSCULAR
  Filled 2014-08-25: qty 250

## 2014-08-25 NOTE — ED Notes (Signed)
Pt given basin and wash rags per her request to wash herself

## 2014-08-25 NOTE — ED Notes (Signed)
Pt states that she had foul smelling vaginal discharge and lower abdominal pain

## 2014-08-25 NOTE — Discharge Instructions (Signed)
Return here as needed.  Follow-up with your primary doctor. °

## 2014-08-25 NOTE — ED Notes (Signed)
Pt ready for discharge. No reaction to antibiotics noted.

## 2014-08-25 NOTE — ED Provider Notes (Signed)
CSN: [id number]     Arrival date & time 08/25/14  7124 History   First MD Initiated Contact with Patient 08/25/14 [phone]     Chief Complaint  Patient presents with  . Vaginal Discharge     (Consider location/radiation/quality/duration/timing/severity/associated sxs/prior Treatment) HPI [name] is a 33 year old female with a history of ectopic pregnancy requiring surgery, tubal ligation and previous STI, who presents to the ED with complaints of vaginal discharge and odor. States the symptoms started a month ago, with creamy whitish discharge, abdominal cramping, and vaginal pruritus. She treated with OTC yeast infection creams, but without success. In the past week the vaginal discharge has markedly increased and she noticed an unpleasant odor, despite adequate hygiene. Admits to chronic mild dyspareunia, states the pain has increased in the past week. She also endorses dysuria on initiation of urination. Denies any fever, chills, malaise, nausea, or vomiting. States menses are regular with LMP 2 weeks ago. Patient states she has been in a monogamous heterosexual relationship with her husband and denies any other unprotected sexual encounters. However, states that she has high suspicion of her husband's infidelity. Patient has a history of STI infection in 1998 as a result of partner's infidelity with confirmed infection with Chlamydia, gonorrhea, syphilis and crabs, which were treated and resolved.  Past Medical History  Diagnosis Date  . Irritable bowel   . Asthma     meds as needed  . Ectopic pregnancy     requiring surgery  . Gonorrhea   . Preterm labor   . Arthritis   . Chronic back pain    Past Surgical History  Procedure Laterality Date  . Wisdom tooth extraction    . Tubal ligation  2005  . Laparoscopy  05/30/2012    Procedure: LAPAROSCOPY OPERATIVE;  Surgeon: Osborne Oman, MD;  Location: [facility];  Service: Gynecology;  Laterality: N/A;  Operative laparoscopy right  salpingectomy and removal of ectopic pregnancy  . Salpingectomy    . Laparoscopy for ectopic pregnancy     No family history on file. History  Substance Use Topics  . Smoking status: Current Every Day Smoker    Types: Cigarettes  . Smokeless tobacco: Never Used  . Alcohol Use: Yes     Comment: occasional   OB History   Grav Para Term Preterm Abortions TAB SAB Ect Mult Living   6 3 2 1 2  1 1  3      Review of Systems  Constitutional: Negative for fever and chills.  Gastrointestinal: Positive for abdominal pain. Negative for nausea, vomiting, diarrhea and constipation.  Genitourinary: Positive for dysuria, vaginal bleeding, vaginal discharge, pelvic pain and dyspareunia. Negative for urgency, frequency, flank pain, decreased urine volume, genital sores, vaginal pain and menstrual problem.       Patient reports creamy to dark vaginal discharge that has increased in volume, and strong unpleasant vaginal odor.   Skin: Negative for rash and wound.  Neurological: Negative for light-headedness and headaches.    All other systems negative except as documented in the HPI. All pertinent positives and negatives as reviewed in the HPI.   Allergies  Hydrocodeine; Latex; Raspberry; and Vioxx  Home Medications   Prior to Admission medications   Medication Sig Start Date End Date Taking? Authorizing Provider  buPROPion (WELLBUTRIN XL) 150 MG 24 hr tablet Take 150 mg by mouth 2 (two) times daily.   Yes Historical Provider, MD  ibuprofen (ADVIL,MOTRIN) 800 MG tablet Take 800 mg by mouth every  8 (eight) hours as needed for pain.   Yes Historical Provider, MD  nortriptyline (PAMELOR) 10 MG capsule Take 20 mg by mouth at bedtime.   Yes Historical Provider, MD  propranolol (INDERAL) 60 MG tablet Take 1 tablet (60 mg total) by mouth 2 (two) times daily. 05/09/14  Yes Marcial Pacas, MD  rizatriptan (MAXALT-MLT) 10 MG disintegrating tablet Take 1 tablet (10 mg total) by mouth as needed for migraine. May  repeat in 2 hours if needed 05/09/14  Yes Marcial Pacas, MD  traMADol (ULTRAM) 50 MG tablet Take 2 tablets (100 mg total) by mouth every 8 (eight) hours as needed. 05/12/14  Yes Harden Mo, MD   BP 133/83  Pulse 93  Temp(Src) 98.2 F (36.8 C) (Oral)  Resp 18  SpO2 99% Physical Exam  Nursing note and vitals reviewed. Constitutional: She is oriented to person, place, and time. She appears well-developed and well-nourished. No distress.  HENT:  Head: Normocephalic and atraumatic.  Mouth/Throat: Oropharynx is clear and moist.  Eyes: Pupils are equal, round, and reactive to light.  Neck: Normal range of motion. Neck supple.  Cardiovascular: Normal rate, regular rhythm and normal heart sounds.  Exam reveals no gallop and no friction rub.   No murmur heard. Pulmonary/Chest: Effort normal and breath sounds normal. No respiratory distress.  Abdominal: Soft. Bowel sounds are normal. She exhibits no distension and no mass. There is tenderness. There is no rebound and no guarding.  Mild suprapubic tenderness, right more than left.   Genitourinary: Vaginal discharge found.  Copious brown reddish discharge present in the vaginal vault and cervix. Mild CMT, no adnexal tenderness, no masses.   Neurological: She is alert and oriented to person, place, and time.  Skin: Skin is warm and dry. Rash noted. She is not diaphoretic.  Dime sized scaling erythematous macule posterior right calf.     ED Course  Procedures (including critical care time) Labs Review Labs Reviewed  WET PREP, GENITAL - Abnormal; Notable for the following:    Clue Cells Wet Prep HPF POC FEW (*)    WBC, Wet Prep HPF POC TOO NUMEROUS TO COUNT (*)    All other components within normal limits  URINALYSIS, ROUTINE W REFLEX MICROSCOPIC - Abnormal; Notable for the following:    APPearance CLOUDY (*)    Hgb urine dipstick LARGE (*)    Leukocytes, UA SMALL (*)    All other components within normal limits  URINE MICROSCOPIC-ADD ON -  Abnormal; Notable for the following:    Squamous Epithelial / LPF FEW (*)    Bacteria, UA FEW (*)    All other components within normal limits  GC/CHLAMYDIA PROBE AMP  PREGNANCY, URINE    The patient will be treated for STD based on her PE findings. The patient is advised that this could be an STD and that she needs to have no sexual contact till her symptoms resolve. Told to follow up with her PCP and return here as needed.   Brent General, PA-C 08/29/14 1027

## 2014-08-26 LAB — GC/CHLAMYDIA PROBE AMP
CT Probe RNA: NEGATIVE
GC Probe RNA: NEGATIVE

## 2014-08-30 ENCOUNTER — Telehealth (HOSPITAL_COMMUNITY): Payer: Self-pay

## 2014-08-30 NOTE — ED Provider Notes (Signed)
Medical screening examination/treatment/procedure(s) were performed by non-physician practitioner and as supervising physician I was immediately available for consultation/collaboration.  Richarda Blade, MD 08/30/14 [phone]

## 2014-10-06 ENCOUNTER — Encounter (HOSPITAL_COMMUNITY): Payer: Self-pay | Admitting: Emergency Medicine

## 2014-10-06 ENCOUNTER — Emergency Department (HOSPITAL_COMMUNITY)
Admission: EM | Admit: 2014-10-06 | Discharge: 2014-10-07 | Disposition: A | Discharge status: Home / Self Care | Payer: Medicaid Other | Attending: Emergency Medicine | Admitting: Emergency Medicine

## 2014-10-06 DIAGNOSIS — Z8619 Personal history of other infectious and parasitic diseases: Secondary | ICD-10-CM | POA: Insufficient documentation

## 2014-10-06 DIAGNOSIS — R51 Headache: Secondary | ICD-10-CM

## 2014-10-06 DIAGNOSIS — G44019 Episodic cluster headache, not intractable: Secondary | ICD-10-CM | POA: Insufficient documentation

## 2014-10-06 DIAGNOSIS — Z8719 Personal history of other diseases of the digestive system: Secondary | ICD-10-CM | POA: Insufficient documentation

## 2014-10-06 DIAGNOSIS — Z720 Tobacco use: Secondary | ICD-10-CM | POA: Insufficient documentation

## 2014-10-06 DIAGNOSIS — Z8739 Personal history of other diseases of the musculoskeletal system and connective tissue: Secondary | ICD-10-CM | POA: Insufficient documentation

## 2014-10-06 DIAGNOSIS — G8929 Other chronic pain: Secondary | ICD-10-CM | POA: Insufficient documentation

## 2014-10-06 DIAGNOSIS — J45909 Unspecified asthma, uncomplicated: Secondary | ICD-10-CM | POA: Insufficient documentation

## 2014-10-06 DIAGNOSIS — R519 Headache, unspecified: Secondary | ICD-10-CM

## 2014-10-06 DIAGNOSIS — Z91040 Latex allergy status: Secondary | ICD-10-CM | POA: Insufficient documentation

## 2014-10-06 LAB — COMPREHENSIVE METABOLIC PANEL
ALBUMIN: 3.5 g/dL (ref 3.5–5.2)
ALT: 40 U/L — ABNORMAL HIGH (ref 0–35)
ANION GAP: 10 (ref 5–15)
AST: 29 U/L (ref 0–37)
Alkaline Phosphatase: 162 U/L — ABNORMAL HIGH (ref 39–117)
BUN: 7 mg/dL (ref 6–23)
CALCIUM: 9.1 mg/dL (ref 8.4–10.5)
CO2: 26 mEq/L (ref 19–32)
CREATININE: 0.61 mg/dL (ref 0.50–1.10)
Chloride: 105 mEq/L (ref 96–112)
GFR calc Af Amer: >90 mL/min (ref >90)
Glucose, Bld: 115 mg/dL — ABNORMAL HIGH (ref 70–99)
Potassium: 3.9 mEq/L (ref 3.7–5.3)
Sodium: 141 mEq/L (ref 137–147)
Total Bilirubin: <0.2 mg/dL — ABNORMAL LOW (ref 0.3–1.2)
Total Protein: 7.5 g/dL (ref 6.0–8.3)

## 2014-10-06 LAB — CBC WITH DIFFERENTIAL/PLATELET
BASOS PCT: 0 % (ref 0–1)
Basophils Absolute: 0 10*3/uL (ref 0.0–0.1)
EOS PCT: 2 % (ref 0–5)
Eosinophils Absolute: 0.2 10*3/uL (ref 0.0–0.7)
HEMATOCRIT: 37 % (ref 36.0–46.0)
Hemoglobin: 12.8 g/dL (ref 12.0–15.0)
Lymphocytes Relative: 40 % (ref 12–46)
Lymphs Abs: 3.4 10*3/uL (ref 0.7–4.0)
MCH: 31.1 pg (ref 26.0–34.0)
MCHC: 34.6 g/dL (ref 30.0–36.0)
MCV: 89.8 fL (ref 78.0–100.0)
MONO ABS: 0.7 10*3/uL (ref 0.1–1.0)
Monocytes Relative: 8 % (ref 3–12)
Neutro Abs: 4.3 10*3/uL (ref 1.7–7.7)
Neutrophils Relative %: 50 % (ref 43–77)
Platelets: 260 10*3/uL (ref 150–400)
RBC: 4.12 MIL/uL (ref 3.87–5.11)
RDW: 13.1 % (ref 11.5–15.5)
WBC: 8.6 10*3/uL (ref 4.0–10.5)

## 2014-10-06 MED ORDER — DIPHENHYDRAMINE HCL 50 MG/ML IJ SOLN
25.0000 mg | Freq: Once | INTRAMUSCULAR | Status: AC
  Administered 2014-10-06: 25 mg via INTRAMUSCULAR
  Filled 2014-10-06: qty 1

## 2014-10-06 MED ORDER — PROCHLORPERAZINE EDISYLATE 5 MG/ML IJ SOLN
10.0000 mg | Freq: Once | INTRAMUSCULAR | Status: AC
  Administered 2014-10-06: 10 mg via INTRAMUSCULAR
  Filled 2014-10-06: qty 2

## 2014-10-06 NOTE — ED Provider Notes (Signed)
CSN: [id number]     Arrival date & time 10/06/14  2038 History   First MD Initiated Contact with Patient 10/06/14 2258     Chief Complaint  Patient presents with  . Hypertension     (Consider location/radiation/quality/duration/timing/severity/associated sxs/prior Treatment) HPI Comments: Patient is a 33 year old female past medical history significant for asthma, chronic back pain, irritable bowel syndrome presented to emergency department for 2-3 weeks of intermittent episodes of throbbing headache with radiation from forehead to back of head. Patient endorses associated photophobia and nausea without vomiting. She's tried taking her Wellbutrin without any improvement of her symptoms. Patient states she noticed she had an incident of elevated blood pressure earlier today when she checked, states it was 149/106. She states she does not have any more of her propanolol and has not taken it in several years. She denies any fevers, chills, syncope, neck stiffness, visual disturbance.   Past Medical History  Diagnosis Date  . Irritable bowel   . Asthma     meds as needed  . Ectopic pregnancy     requiring surgery  . Gonorrhea   . Preterm labor   . Arthritis   . Chronic back pain    Past Surgical History  Procedure Laterality Date  . Wisdom tooth extraction    . Tubal ligation  2005  . Laparoscopy  05/30/2012    Procedure: LAPAROSCOPY OPERATIVE;  Surgeon: Osborne Oman, MD;  Location: [facility];  Service: Gynecology;  Laterality: N/A;  Operative laparoscopy right salpingectomy and removal of ectopic pregnancy  . Salpingectomy    . Laparoscopy for ectopic pregnancy     No family history on file. History  Substance Use Topics  . Smoking status: Current Every Day Smoker    Types: Cigarettes  . Smokeless tobacco: Never Used  . Alcohol Use: Yes     Comment: occasional   OB History   Grav Para Term Preterm Abortions TAB SAB Ect Mult Living   6 3 2 1 2  1 1  3      Review of Systems   Constitutional: Negative for fever and chills.  Neurological: Positive for headaches.  All other systems reviewed and are negative.     Allergies  Hydrocodeine; Latex; Raspberry; and Vioxx  Home Medications   Prior to Admission medications   Medication Sig Start Date End Date Taking? Authorizing Provider  buPROPion (WELLBUTRIN XL) 150 MG 24 hr tablet Take 150 mg by mouth 2 (two) times daily.   Yes Historical Provider, MD  ibuprofen (ADVIL,MOTRIN) 800 MG tablet Take 800 mg by mouth every 8 (eight) hours as needed for pain.   Yes Historical Provider, MD  nortriptyline (PAMELOR) 10 MG capsule Take 20 mg by mouth at bedtime.   Yes Historical Provider, MD  propranolol (INDERAL) 60 MG tablet Take 1 tablet (60 mg total) by mouth 2 (two) times daily. 05/09/14  Yes Marcial Pacas, MD   BP 125/79  Pulse 81  Temp(Src) 97.8 F (36.6 C) (Oral)  Resp 24  Ht 5\' 4"  (1.626 m)  Wt 195 lb (88.451 kg)  BMI 33.46 kg/m2  SpO2 100%  LMP 09/25/2014 Physical Exam  Nursing note and vitals reviewed. Constitutional: She is oriented to person, place, and time. She appears well-developed and well-nourished. No distress.  HENT:  Head: Normocephalic and atraumatic.  Right Ear: External ear normal.  Left Ear: External ear normal.  Nose: Nose normal.  Mouth/Throat: Oropharynx is clear and moist. No oropharyngeal exudate.  Eyes: Conjunctivae and EOM  are normal. Pupils are equal, round, and reactive to light.  Neck: Normal range of motion and full passive range of motion without pain. Neck supple. No spinous process tenderness and no muscular tenderness present.  Cardiovascular: Normal rate, regular rhythm, normal heart sounds and intact distal pulses.   Pulmonary/Chest: Effort normal and breath sounds normal. No respiratory distress.  Abdominal: Soft. There is no tenderness.  Musculoskeletal: She exhibits no edema.  Neurological: She is alert and oriented to person, place, and time. She has normal strength. No  cranial nerve deficit. Gait normal. GCS eye subscore is 4. GCS verbal subscore is 5. GCS motor subscore is 6.  Sensation grossly intact.  No pronator drift.  Bilateral heel-knee-shin intact.  Skin: Skin is warm and dry. She is not diaphoretic.    ED Course  Procedures (including critical care time) Medications  prochlorperazine (COMPAZINE) injection 10 mg (10 mg Intramuscular Given 10/06/14 2318)  diphenhydrAMINE (BENADRYL) injection 25 mg (25 mg Intramuscular Given 10/06/14 2317)    Labs Review Labs Reviewed  COMPREHENSIVE METABOLIC PANEL - Abnormal; Notable for the following:    Glucose, Bld 115 (*)    ALT 40 (*)    Alkaline Phosphatase 162 (*)    Total Bilirubin <0.2 (*)    All other components within normal limits  CBC WITH DIFFERENTIAL    Imaging Review No results found.   EKG Interpretation None      Patient not hypertensive during emergency department stay. MDM   Final diagnoses:  Nonintractable episodic headache, unspecified headache type    Filed Vitals:   10/07/14 0027  BP: 125/79  Pulse: 81  Temp: 97.8 F (36.6 C)  Resp: 24   Afebrile, NAD, non-toxic appearing, AAOx4. Pt HA treated and improved while in ED.  Presentation is like pts typical HA and non concerning for [facility], ICH, Meningitis, or temporal arteritis. Pt is afebrile with no focal neuro deficits, nuchal rigidity, or change in vision. Pt is to follow up with PCP to discuss prophylactic medication. Pt verbalizes understanding and is agreeable with plan to dc. Patient is stable at time of discharge        TELIAH BUFFALO, PA-C 10/07/14 2836

## 2014-10-06 NOTE — ED Notes (Signed)
Pt. reports elevated blood pressure ( 149/106)  and headache onset this afternoon , denies nausea or vomitting .

## 2014-10-07 NOTE — ED Provider Notes (Signed)
Medical screening examination/treatment/procedure(s) were performed by non-physician practitioner and as supervising physician I was immediately available for consultation/collaboration.   EKG Interpretation None      Rolland Porter, MD, Abram Sander   Janice Norrie, MD 10/07/14 [phone]

## 2014-10-07 NOTE — Discharge Instructions (Signed)
Please follow up with your primary care physician in 1-2 days. If you do not have one please call the [facility] number listed above. Please alternate between Motrin and Tylenol every three hours for pain.  Please read all discharge instructions and return precautions.   Tension Headache A tension headache is a feeling of pain, pressure, or aching often felt over the front and sides of the head. The pain can be dull or can feel tight (constricting). It is the most common type of headache. Tension headaches are not normally associated with nausea or vomiting and do not get worse with physical activity. Tension headaches can last 30 minutes to several days.  CAUSES  The exact cause is not known, but it may be caused by chemicals and hormones in the brain that lead to pain. Tension headaches often begin after stress, anxiety, or depression. Other triggers may include:  Alcohol.  Caffeine (too much or withdrawal).  Respiratory infections (colds, flu, sinus infections).  Dental problems or teeth clenching.  Fatigue.  Holding your head and neck in one position too long while using a computer. SYMPTOMS   Pressure around the head.   Dull, aching head pain.   Pain felt over the front and sides of the head.   Tenderness in the muscles of the head, neck, and shoulders. DIAGNOSIS  A tension headache is often diagnosed based on:   Symptoms.   Physical examination.   A CT scan or MRI of your head. These tests may be ordered if symptoms are severe or unusual. TREATMENT  Medicines may be given to help relieve symptoms.  HOME CARE INSTRUCTIONS   Only take over-the-counter or prescription medicines for pain or discomfort as directed by your caregiver.   Lie down in a dark, quiet room when you have a headache.   Keep a journal to find out what may be triggering your headaches. For example, write down:  What you eat and drink.  How much sleep you get.  Any  change to your diet or medicines.  Try massage or other relaxation techniques.   Ice packs or heat applied to the head and neck can be used. Use these 3 to 4 times per day for 15 to 20 minutes each time, or as needed.   Limit stress.   Sit up straight, and do not tense your muscles.   Quit smoking if you smoke.  Limit alcohol use.  Decrease the amount of caffeine you drink, or stop drinking caffeine.  Eat and exercise regularly.  Get 7 to 9 hours of sleep, or as recommended by your caregiver.  Avoid excessive use of pain medicine as recurrent headaches can occur.  SEEK MEDICAL CARE IF:   You have problems with the medicines you were prescribed.  Your medicines do not work.  You have a change from the usual headache.  You have nausea or vomiting. SEEK IMMEDIATE MEDICAL CARE IF:   Your headache becomes severe.  You have a fever.  You have a stiff neck.  You have loss of vision.  You have muscular weakness or loss of muscle control.  You lose your balance or have trouble walking.  You feel faint or pass out.  You have severe symptoms that are different from your first symptoms. MAKE SURE YOU:   Understand these instructions.  Will watch your condition.  Will get help right away if you are not doing well or get worse. Document Released: 12/09/2005 Document Revised: 03/02/2012 Document  Reviewed: 11/29/2011 Exit[employer] Patient Information 2015 [place], [place]. This information is not intended to replace advice given to you by your health care provider. Make sure you discuss any questions you have with your health care provider.

## 2014-10-07 NOTE — ED Notes (Signed)
Gave pt [place] sandwich and ginger ale (approved by RN)

## 2014-10-24 ENCOUNTER — Encounter (HOSPITAL_COMMUNITY): Payer: Self-pay | Admitting: Emergency Medicine

## 2015-03-20 ENCOUNTER — Emergency Department (HOSPITAL_COMMUNITY): Payer: Medicaid Other

## 2015-03-20 ENCOUNTER — Emergency Department (HOSPITAL_COMMUNITY)
Admission: EM | Admit: 2015-03-20 | Discharge: 2015-03-21 | Disposition: A | Discharge status: Home / Self Care | Payer: Medicaid Other | Attending: Emergency Medicine | Admitting: Emergency Medicine

## 2015-03-20 ENCOUNTER — Encounter (HOSPITAL_COMMUNITY): Payer: Self-pay | Admitting: Emergency Medicine

## 2015-03-20 DIAGNOSIS — Z8739 Personal history of other diseases of the musculoskeletal system and connective tissue: Secondary | ICD-10-CM | POA: Insufficient documentation

## 2015-03-20 DIAGNOSIS — Z79899 Other long term (current) drug therapy: Secondary | ICD-10-CM | POA: Diagnosis not present

## 2015-03-20 DIAGNOSIS — R0602 Shortness of breath: Secondary | ICD-10-CM

## 2015-03-20 DIAGNOSIS — G8929 Other chronic pain: Secondary | ICD-10-CM | POA: Insufficient documentation

## 2015-03-20 DIAGNOSIS — Z720 Tobacco use: Secondary | ICD-10-CM | POA: Insufficient documentation

## 2015-03-20 DIAGNOSIS — J45901 Unspecified asthma with (acute) exacerbation: Secondary | ICD-10-CM | POA: Diagnosis not present

## 2015-03-20 DIAGNOSIS — Z91040 Latex allergy status: Secondary | ICD-10-CM | POA: Diagnosis not present

## 2015-03-20 DIAGNOSIS — Z8619 Personal history of other infectious and parasitic diseases: Secondary | ICD-10-CM | POA: Insufficient documentation

## 2015-03-20 DIAGNOSIS — Z8719 Personal history of other diseases of the digestive system: Secondary | ICD-10-CM | POA: Insufficient documentation

## 2015-03-20 LAB — COMPREHENSIVE METABOLIC PANEL
ALBUMIN: 3.8 g/dL (ref 3.5–5.2)
ALK PHOS: 125 U/L — AB (ref 39–117)
ALT: 22 U/L (ref 0–35)
AST: 25 U/L (ref 0–37)
Anion gap: 6 (ref 5–15)
BUN: 7 mg/dL (ref 6–23)
CO2: 24 mmol/L (ref 19–32)
CREATININE: 0.7 mg/dL (ref 0.50–1.10)
Calcium: 8.6 mg/dL (ref 8.4–10.5)
Chloride: 110 mmol/L (ref 96–112)
GFR calc Af Amer: >90 mL/min (ref >90)
GFR calc non Af Amer: >90 mL/min (ref >90)
Glucose, Bld: 110 mg/dL — ABNORMAL HIGH (ref 70–99)
POTASSIUM: 3.5 mmol/L (ref 3.5–5.1)
SODIUM: 140 mmol/L (ref 135–145)
Total Bilirubin: 0.5 mg/dL (ref 0.3–1.2)
Total Protein: 6.9 g/dL (ref 6.0–8.3)

## 2015-03-20 LAB — CBC WITH DIFFERENTIAL/PLATELET
Basophils Absolute: 0 10*3/uL (ref 0.0–0.1)
Basophils Relative: 0 % (ref 0–1)
EOS PCT: 5 % (ref 0–5)
Eosinophils Absolute: 0.3 10*3/uL (ref 0.0–0.7)
HCT: 39.5 % (ref 36.0–46.0)
Hemoglobin: 12 g/dL (ref 12.0–15.0)
LYMPHS ABS: 2.8 10*3/uL (ref 0.7–4.0)
LYMPHS PCT: 42 % (ref 12–46)
MCH: 30.2 pg (ref 26.0–34.0)
MCHC: 30.4 g/dL (ref 30.0–36.0)
MCV: 99.2 fL (ref 78.0–100.0)
MONOS PCT: 9 % (ref 3–12)
Monocytes Absolute: 0.6 10*3/uL (ref 0.1–1.0)
Neutro Abs: 3 10*3/uL (ref 1.7–7.7)
Neutrophils Relative %: 44 % (ref 43–77)
Platelets: 240 10*3/uL (ref 150–400)
RBC: 3.98 MIL/uL (ref 3.87–5.11)
RDW: 12.8 % (ref 11.5–15.5)
WBC: 6.8 10*3/uL (ref 4.0–10.5)

## 2015-03-20 LAB — PROTIME-INR
INR: 0.96 (ref 0.00–1.49)
Prothrombin Time: 12.9 seconds (ref 11.6–15.2)

## 2015-03-20 LAB — I-STAT BETA HCG BLOOD, ED (MC, WL, AP ONLY): I-stat hCG, quantitative: <5 m[IU]/mL (ref <5)

## 2015-03-20 MED ORDER — ALBUTEROL (5 MG/ML) CONTINUOUS INHALATION SOLN
10.0000 mg/h | INHALATION_SOLUTION | Freq: Once | RESPIRATORY_TRACT | Status: AC
  Administered 2015-03-20: 10 mg/h via RESPIRATORY_TRACT
  Filled 2015-03-20: qty 20

## 2015-03-20 MED ORDER — IOHEXOL 350 MG/ML SOLN
100.0000 mL | Freq: Once | INTRAVENOUS | Status: AC | PRN
  Administered 2015-03-20: 100 mL via INTRAVENOUS

## 2015-03-20 MED ORDER — IPRATROPIUM-ALBUTEROL 0.5-2.5 (3) MG/3ML IN SOLN
3.0000 mL | Freq: Once | RESPIRATORY_TRACT | Status: AC
  Administered 2015-03-20: 3 mL via RESPIRATORY_TRACT
  Filled 2015-03-20: qty 3

## 2015-03-20 MED ORDER — IPRATROPIUM BROMIDE 0.02 % IN SOLN
0.5000 mg | Freq: Once | RESPIRATORY_TRACT | Status: AC
  Administered 2015-03-20: 0.5 mg via RESPIRATORY_TRACT
  Filled 2015-03-20: qty 2.5

## 2015-03-20 MED ORDER — PREDNISONE 20 MG PO TABS
60.0000 mg | ORAL_TABLET | Freq: Once | ORAL | Status: AC
  Administered 2015-03-20: 60 mg via ORAL
  Filled 2015-03-20: qty 3

## 2015-03-20 NOTE — ED Provider Notes (Signed)
CSN: [id number]     Arrival date & time 03/20/15  2040 History   First MD Initiated Contact with Patient 03/20/15 2216     Chief Complaint  Patient presents with  . Asthma     (Consider location/radiation/quality/duration/timing/severity/associated sxs/prior Treatment) HPI    PCP: Elyn Peers, MD Blood pressure 145/99, pulse 74, temperature 97.5 F (36.4 C), temperature source Oral, resp. rate 20, height 5\' 4"  (1.626 m), weight 205 lb (92.987 kg), last menstrual period 03/11/2015, SpO2 98 %.  Lilyan Gilford Toney Rakes is a 34 y.o.female with a significant PMH of IBS, asthma, ectopic, gonorrhea, arthritis, chronic back pain presents to the ER with complaints of shortness of breath. She reports never having severe symptoms like this in the past. She is a smoker and has not changed her smoking habits. Yesterday she developed some pain to her left upper chest and SOB. It has been progressively worsening and she is now having some fatigue. She is tachypnic and speaking 2-3 words at a time for HPI. She reports trying her albuterol pump at home without any improvement.  She reports her chest feels tight. She was given an albuterol breathing treatment in triage and reports this also did not help.  Negative Review of Symptoms: No LE swelling, no recent travels, no BC, no back pain, no vaginal bleeding, dysuria, headache, change in vision, dizziness.     Past Medical History  Diagnosis Date  . Irritable bowel   . Asthma     meds as needed  . Ectopic pregnancy     requiring surgery  . Gonorrhea   . Preterm labor   . Arthritis   . Chronic back pain    Past Surgical History  Procedure Laterality Date  . Wisdom tooth extraction    . Tubal ligation  2005  . Laparoscopy  05/30/2012    Procedure: LAPAROSCOPY OPERATIVE;  Surgeon: Osborne Oman, MD;  Location: [facility];  Service: Gynecology;  Laterality: N/A;  Operative laparoscopy right salpingectomy and removal of ectopic pregnancy  .  Salpingectomy    . Laparoscopy for ectopic pregnancy     No family history on file. History  Substance Use Topics  . Smoking status: Current Every Day Smoker -- 0.25 packs/day    Types: Cigarettes  . Smokeless tobacco: Never Used  . Alcohol Use: Yes     Comment: occasional   OB History    Gravida Para Term Preterm AB TAB SAB Ectopic Multiple Living   6 3 2 1 2  1 1  3      Review of Systems  10 Systems reviewed and are negative for acute change except as noted in the HPI.    Allergies  Hydrocodeine; Latex; Raspberry; and Vioxx  Home Medications   Prior to Admission medications   Medication Sig Start Date End Date Taking? Authorizing Provider  albuterol (PROVENTIL HFA;VENTOLIN HFA) 108 (90 BASE) MCG/ACT inhaler Inhale 1 puff into the lungs every 6 (six) hours as needed for wheezing or shortness of breath.   Yes Historical Provider, MD  buPROPion (WELLBUTRIN XL) 150 MG 24 hr tablet Take 150 mg by mouth 2 (two) times daily.   Yes Historical Provider, MD  ibuprofen (ADVIL,MOTRIN) 800 MG tablet Take 800 mg by mouth every 8 (eight) hours as needed for pain.   Yes Historical Provider, MD  nortriptyline (PAMELOR) 10 MG capsule Take 20 mg by mouth at bedtime.   Yes Historical Provider, MD  propranolol (INDERAL) 60 MG tablet Take 1 tablet (  60 mg total) by mouth 2 (two) times daily. 05/09/14  Yes Marcial Pacas, MD  albuterol (PROVENTIL HFA;VENTOLIN HFA) 108 (90 BASE) MCG/ACT inhaler Inhale 1-2 puffs into the lungs every 6 (six) hours as needed for wheezing or shortness of breath. 03/21/15   Donevin Sainsbury Carlota Raspberry, PA-C  azithromycin (ZITHROMAX) 250 MG tablet Take 1 tablet (250 mg total) by mouth daily. Take first 2 tablets together, then 1 every day until finished. 03/21/15   Chiara Coltrin Carlota Raspberry, PA-C  predniSONE (DELTASONE) 10 MG tablet Take 2 tablets (20 mg total) by mouth daily. 03/21/15   Jimmie Rueter Carlota Raspberry, PA-C   BP 152/91 mmHg  Pulse 68  Temp(Src) 97.5 F (36.4 C) (Oral)  Resp 15  Ht 5\' 4"  (1.626 m)   Wt 205 lb (92.987 kg)  BMI 35.17 kg/m2  SpO2 100%  LMP 03/11/2015 Physical Exam  Constitutional: She appears well-developed and well-nourished. She appears distressed.  HENT:  Head: Normocephalic and atraumatic.  Right Ear: Tympanic membrane and ear canal normal.  Left Ear: Tympanic membrane and ear canal normal.  Nose: Nose normal.  Mouth/Throat: Uvula is midline and oropharynx is clear and moist.  Eyes: Lids are normal. Pupils are equal, round, and reactive to light.  Neck: Normal range of motion. Neck supple.  Cardiovascular: Normal rate and regular rhythm.   Pulmonary/Chest: Breath sounds normal. No accessory muscle usage. Tachypnea noted. She is in respiratory distress. She has no decreased breath sounds. She has no wheezes. She has no rhonchi. She has no rales.  Increased effort of breathing  Abdominal: Soft. Bowel sounds are normal. She exhibits no distension. There is no tenderness. There is no rigidity.  Neurological: She is alert.  Skin: Skin is warm and dry.  Nursing note and vitals reviewed.   ED Course  Procedures (including critical care time) Labs Review Labs Reviewed  COMPREHENSIVE METABOLIC PANEL - Abnormal; Notable for the following:    Glucose, Bld 110 (*)    Alkaline Phosphatase 125 (*)    All other components within normal limits  CBC WITH DIFFERENTIAL/PLATELET  PROTIME-INR  I-STAT BETA HCG BLOOD, ED ([facility], [facility], [facility] ONLY)    Imaging Review Dg Chest 2 View  03/20/2015   CLINICAL DATA:  History of asthma with acute mid to left chest pain and shortness of breath since last night.  EXAM: CHEST  2 VIEW  COMPARISON:  07/10/2014  FINDINGS: The heart size and mediastinal contours are within normal limits. Both lungs are clear. The visualized skeletal structures are unremarkable.  IMPRESSION: No active cardiopulmonary disease.   Electronically Signed   By: Jerilynn Mages.  Shick M.D.   On: 03/20/2015 21:49   Ct Angio Chest Pe W/cm &/or Wo Cm  03/20/2015   CLINICAL DATA:  Shortness  of breath for 2 hr.  Chest tightness.  EXAM: CT ANGIOGRAPHY CHEST WITH CONTRAST  TECHNIQUE: Multidetector CT imaging of the chest was performed using the standard protocol during bolus administration of intravenous contrast. Multiplanar CT image reconstructions and MIPs were obtained to evaluate the vascular anatomy.  CONTRAST:  129mL OMNIPAQUE IOHEXOL 350 MG/ML SOLN  COMPARISON:  Radiographs earlier this day. Chest CT PE protocol 06/02/2012  FINDINGS: There are no filling defects within the pulmonary arteries to suggest pulmonary embolus.  The thoracic aorta is normal in caliber. No pleural or pericardial effusion. No mediastinal or hilar adenopathy.  Mild bronchial thickening and minimal bibasilar atelectasis. No consolidation, pulmonary nodule or mass.  No acute abnormality in the included upper abdomen. There are no acute or suspicious  osseous abnormalities.  Review of the MIP images confirms the above findings.  IMPRESSION: 1. No pulmonary embolus. 2. Central bronchial thickening, may reflect bronchitis or asthma.   Electronically Signed   By: Jeb Levering M.D.   On: 03/20/2015 23:58     EKG Interpretation None      MDM   Final diagnoses:  Shortness of breath  Asthma with acute exacerbation, unspecified asthma severity    Medications  ipratropium-albuterol (DUONEB) 0.5-2.5 (3) MG/3ML nebulizer solution 3 mL (3 mLs Nebulization Given 03/20/15 2057)  albuterol (PROVENTIL,VENTOLIN) solution continuous neb (10 mg/hr Nebulization Given 03/20/15 2335)  ipratropium (ATROVENT) nebulizer solution 0.5 mg (0.5 mg Nebulization Given 03/20/15 2336)  predniSONE (DELTASONE) tablet 60 mg (60 mg Oral Given 03/20/15 2253)  iohexol (OMNIPAQUE) 350 MG/ML injection 100 mL (100 mLs Intravenous Contrast Given 03/20/15 2330)  azithromycin (ZITHROMAX) tablet 500 mg (500 mg Oral Given 03/21/15 0048)  albuterol (PROVENTIL) (2.5 MG/3ML) 0.083% nebulizer solution 5 mg (5 mg Nebulization Given 03/21/15 0048)  ipratropium  (ATROVENT) nebulizer solution 0.5 mg (0.5 mg Nebulization Given 03/21/15 0049)   CRITICAL CARE Performed by: Linus Mako Total critical care time: 60 Critical care time was exclusive of separately billable procedures and treating other patients. Critical care was necessary to treat or prevent imminent or life-threatening deterioration. Critical care was time spent personally by me on the following activities: development of treatment plan with patient and/or surrogate as well as nursing, discussions with consultants, evaluation of patient's response to treatment, examination of patient, obtaining history from patient or surrogate, ordering and performing treatments and interventions, ordering and review of laboratory studies, ordering and review of radiographic studies, pulse oximetry and re-evaluation of patient's condition.  She has a history of asthma and reports having significant asthma exacerbation started acutely yesterday Without any relief from the albuterol inhaler. She reports dyspnea on exertion, some mild chest discomfort. She is to Make in speaking to 3 words at a time. This improves to 5-6  words at a time after rest.  10:38 pm Concern for PE, will get CT angio and treat with prednisone, albuterol and atrovent to see if she gets any relief.  1:41 am The patient has had a negative CT angio with contrast to r/o PE. She has signs of acute bronchitis. The hour long nebulizer has worked significantly, she is now speaking in full sentences.   She feels ready to go home. She needs to f/u with PCP to have her nebulizer fixed. Started on Azithromycin, Albuterol inhaler, and prednisone. - she has been given strict return to ED precautions. If her SOB or asthma returns despite treatment she has been made aware she may then need admission.  54 y.Wagoner Alston's evaluation in the Emergency Department is complete. It has been determined that no acute conditions requiring further  emergency intervention are present at this time. The patient/guardian have been advised of the diagnosis and plan. We have discussed signs and symptoms that warrant return to the ED, such as changes or worsening in symptoms.  Vital signs are stable at discharge. Filed Vitals:   03/21/15 0000  BP: 152/91  Pulse: 68  Temp:   Resp: 15    Patient/guardian has voiced understanding and agreed to follow-up with the PCP or specialist.   Delos Haring, PA-C 03/21/15 0143  Serita Grit, MD 03/21/15 2354

## 2015-03-20 NOTE — ED Notes (Signed)
Patient c/o hx asthma, speaking in broken sentences, using motions to communicate, states this started about 2 hours ago. Patient used home inhaler without relief, states she was unable to use her home nebulizer as it is broken. C/o chest tightness.

## 2015-03-20 NOTE — ED Notes (Signed)
Pt called in acute pain induced distress, localizing pain on her left chest radiating to the neck, describing it as sharp shooting pain, on the spot pulseoximetry was unremarkable, pt sats 100% on room air, B/p elevated 146/101. Called lab to check on the status of the CMP as it was not showing as processing despite having sent it 20 mins prior to this note, consulted EDP attending to see if we could expedite CT Angio after reviewing a recent creatine for pt in an attempt to rule out PE. Pt taken to CT, RT on stand by to give pt a continuous breathing treatment.

## 2015-03-21 MED ORDER — AZITHROMYCIN 250 MG PO TABS
500.0000 mg | ORAL_TABLET | Freq: Once | ORAL | Status: AC
  Administered 2015-03-21: 500 mg via ORAL
  Filled 2015-03-21: qty 2

## 2015-03-21 MED ORDER — PREDNISONE 10 MG PO TABS: 20.0000 mg | ORAL_TABLET | Freq: Every day | ORAL | Status: DC

## 2015-03-21 MED ORDER — IPRATROPIUM BROMIDE 0.02 % IN SOLN
0.5000 mg | Freq: Once | RESPIRATORY_TRACT | Status: AC
  Administered 2015-03-21: 0.5 mg via RESPIRATORY_TRACT
  Filled 2015-03-21: qty 2.5

## 2015-03-21 MED ORDER — ALBUTEROL SULFATE HFA 108 (90 BASE) MCG/ACT IN AERS: 1.0000 | INHALATION_SPRAY | Freq: Four times a day (QID) | RESPIRATORY_TRACT | Status: DC | PRN

## 2015-03-21 MED ORDER — ALBUTEROL SULFATE (2.5 MG/3ML) 0.083% IN NEBU
5.0000 mg | INHALATION_SOLUTION | Freq: Once | RESPIRATORY_TRACT | Status: AC
  Administered 2015-03-21: 5 mg via RESPIRATORY_TRACT
  Filled 2015-03-21: qty 6

## 2015-03-21 MED ORDER — AZITHROMYCIN 250 MG PO TABS: 250.0000 mg | ORAL_TABLET | Freq: Every day | ORAL | Status: DC

## 2015-03-21 NOTE — Discharge Instructions (Signed)
Asthma Attack Prevention Although there is no way to prevent asthma from starting, you can take steps to control the disease and reduce its symptoms. Learn about your asthma and how to control it. Take an active role to control your asthma by working with your health care provider to create and follow an asthma action plan. An asthma action plan guides you in:  Taking your medicines properly.  Avoiding things that set off your asthma or make your asthma worse (asthma triggers).  Tracking your level of asthma control.  Responding to worsening asthma.  Seeking emergency care when needed. To track your asthma, keep records of your symptoms, check your peak flow number using a handheld device that shows how well air moves out of your lungs (peak flow meter), and get regular asthma checkups.  WHAT ARE SOME WAYS TO PREVENT AN ASTHMA ATTACK?  Take medicines as directed by your health care provider.  Keep track of your asthma symptoms and level of control.  With your health care provider, write a detailed plan for taking medicines and managing an asthma attack. Then be sure to follow your action plan. Asthma is an ongoing condition that needs regular monitoring and treatment.  Identify and avoid asthma triggers. Many outdoor allergens and irritants (such as pollen, mold, cold air, and air pollution) can trigger asthma attacks. Find out what your asthma triggers are and take steps to avoid them.  Monitor your breathing. Learn to recognize warning signs of an attack, such as coughing, wheezing, or shortness of breath. Your lung function may decrease before you notice any signs or symptoms, so regularly measure and record your peak airflow with a home peak flow meter.  Identify and treat attacks early. If you act quickly, you are less likely to have a severe attack. You will also need less medicine to control your symptoms. When your peak flow measurements decrease and alert you to an upcoming attack,  take your medicine as instructed and immediately stop any activity that may have triggered the attack. If your symptoms do not improve, get medical help.  Pay attention to increasing quick-relief inhaler use. If you find yourself relying on your quick-relief inhaler, your asthma is not under control. See your health care provider about adjusting your treatment. WHAT CAN MAKE MY SYMPTOMS WORSE? A number of common things can set off or make your asthma symptoms worse and cause temporary increased inflammation of your airways. Keep track of your asthma symptoms for several weeks, detailing all the environmental and emotional factors that are linked with your asthma. When you have an asthma attack, go back to your asthma diary to see which factor, or combination of factors, might have contributed to it. Once you know what these factors are, you can take steps to control many of them. If you have allergies and asthma, it is important to take asthma prevention steps at home. Minimizing contact with the substance to which you are allergic will help prevent an asthma attack. Some triggers and ways to avoid these triggers are: Animal Dander:  Some people are allergic to the flakes of skin or dried saliva from animals with fur or feathers.   There is no such thing as a hypoallergenic dog or cat breed. All dogs or cats can cause allergies, even if they don't shed.  Keep these pets out of your home.  If you are not able to keep a pet outdoors, keep the pet out of your bedroom and other sleeping areas at all  times, and keep the door closed.  Remove carpets and furniture covered with cloth from your home. If that is not possible, keep the pet away from fabric-covered furniture and carpets. Dust Mites: Many people with asthma are allergic to dust mites. Dust mites are tiny bugs that are found in every home in mattresses, pillows, carpets, fabric-covered furniture, bedcovers, clothes, stuffed toys, and other  fabric-covered items.   Cover your mattress in a special dust-proof cover.  Cover your pillow in a special dust-proof cover, or wash the pillow each week in hot water. Water must be hotter than 130 F (54.4 C) to kill dust mites. Cold or warm water used with detergent and bleach can also be effective.  Wash the sheets and blankets on your bed each week in hot water.  Try not to sleep or lie on cloth-covered cushions.  Call ahead when traveling and ask for a smoke-free hotel room. Bring your own bedding and pillows in case the hotel only supplies feather pillows and down comforters, which may contain dust mites and cause asthma symptoms.  Remove carpets from your bedroom and those laid on concrete, if you can.  Keep stuffed toys out of the bed, or wash the toys weekly in hot water or cooler water with detergent and bleach. Cockroaches: Many people with asthma are allergic to the droppings and remains of cockroaches.   Keep food and garbage in closed containers. Never leave food out.  Use poison baits, traps, powders, gels, or paste (for example, boric acid).  If a spray is used to kill cockroaches, stay out of the room until the odor goes away. Indoor Mold:  Fix leaky faucets, pipes, or other sources of water that have mold around them.  Clean floors and moldy surfaces with a fungicide or diluted bleach.  Avoid using humidifiers, vaporizers, or swamp coolers. These can spread molds through the air. Pollen and Outdoor Mold:  When pollen or mold spore counts are high, try to keep your windows closed.  Stay indoors with windows closed from late morning to afternoon. Pollen and some mold spore counts are highest at that time.  Ask your health care provider whether you need to take anti-inflammatory medicine or increase your dose of the medicine before your allergy season starts. Other Irritants to Avoid:  Tobacco smoke is an irritant. If you smoke, ask your health care provider how  you can quit. Ask family members to quit smoking, too. Do not allow smoking in your home or car.  If possible, do not use a wood-burning stove, kerosene heater, or fireplace. Minimize exposure to all sources of smoke, including incense, candles, fires, and fireworks.  Try to stay away from strong odors and sprays, such as perfume, talcum powder, hair spray, and paints.  Decrease humidity in your home and use an indoor air cleaning device. Reduce indoor humidity to below 60%. Dehumidifiers or central air conditioners can do this.  Decrease house dust exposure by changing furnace and air cooler filters frequently.  Try to have someone else vacuum for you once or twice a week. Stay out of rooms while they are being vacuumed and for a short while afterward.  If you vacuum, use a dust mask from a hardware store, a double-layered or microfilter vacuum cleaner bag, or a vacuum cleaner with a HEPA filter.  Sulfites in foods and beverages can be irritants. Do not drink beer or wine or eat dried fruit, processed potatoes, or shrimp if they cause asthma symptoms.  Cold  air can trigger an asthma attack. Cover your nose and mouth with a scarf on cold or windy days.  Several health conditions can make asthma more difficult to manage, including a runny nose, sinus infections, reflux disease, psychological stress, and sleep apnea. Work with your health care provider to manage these conditions.  Avoid close contact with people who have a respiratory infection such as a cold or the flu, since your asthma symptoms may get worse if you catch the infection. Wash your hands thoroughly after touching items that may have been handled by people with a respiratory infection.  Get a flu shot every year to protect against the flu virus, which often makes asthma worse for days or weeks. Also get a pneumonia shot if you have not previously had one. Unlike the flu shot, the pneumonia shot does not need to be given  yearly. Medicines:  Talk to your health care provider about whether it is safe for you to take aspirin or non-steroidal anti-inflammatory medicines (NSAIDs). In a small number of people with asthma, aspirin and NSAIDs can cause asthma attacks. These medicines must be avoided by people who have known aspirin-sensitive asthma. It is important that people with aspirin-sensitive asthma read labels of all over-the-counter medicines used to treat pain, colds, coughs, and fever.  Beta-blockers and ACE inhibitors are other medicines you should discuss with your health care provider. HOW CAN I FIND OUT WHAT I AM ALLERGIC TO? Ask your asthma health care provider about allergy skin testing or blood testing (the RAST test) to identify the allergens to which you are sensitive. If you are found to have allergies, the most important thing to do is to try to avoid exposure to any allergens that you are sensitive to as much as possible. Other treatments for allergies, such as medicines and allergy shots (immunotherapy) are available.  CAN I EXERCISE? Follow your health care provider's advice regarding asthma treatment before exercising. It is important to maintain a regular exercise program, but vigorous exercise or exercise in cold, humid, or dry environments can cause asthma attacks, especially for those people who have exercise-induced asthma. Document Released: 11/27/2009 Document Revised: 12/14/2013 Document Reviewed: 06/16/2013 [facility] Patient Information 2015 [place], [place]. This information is not intended to replace advice given to you by your health care provider. Make sure you discuss any questions you have with your health care provider.  Shortness of Breath Shortness of breath means you have trouble breathing. It could also mean that you have a medical problem. You should get immediate medical care for shortness of breath. CAUSES   Not enough oxygen in the air such as with high altitudes or a  smoke-filled room.  Certain lung diseases, infections, or problems.  Heart disease or conditions, such as angina or heart failure.  Low red blood cells (anemia).  Poor physical fitness, which can cause shortness of breath when you exercise.  Chest or back injuries or stiffness.  Being overweight.  Smoking.  Anxiety, which can make you feel like you are not getting enough air. DIAGNOSIS  Serious medical problems can often be found during your physical exam. Tests may also be done to determine why you are having shortness of breath. Tests may include:  Chest X-rays.  Lung function tests.  Blood tests.  An electrocardiogram (ECG).  An ambulatory electrocardiogram. An ambulatory ECG records your heartbeat patterns over a 24-hour period.  Exercise testing.  A transthoracic echocardiogram (TTE). During echocardiography, sound waves are used to evaluate how blood flows  through your heart.  A transesophageal echocardiogram (TEE).  Imaging scans. Your health care provider may not be able to find a cause for your shortness of breath after your exam. In this case, it is important to have a follow-up exam with your health care provider as directed.  TREATMENT  Treatment for shortness of breath depends on the cause of your symptoms and can vary greatly. HOME CARE INSTRUCTIONS   Do not smoke. Smoking is a common cause of shortness of breath. If you smoke, ask for help to quit.  Avoid being around chemicals or things that may bother your breathing, such as paint fumes and dust.  Rest as needed. Slowly resume your usual activities.  If medicines were prescribed, take them as directed for the full length of time directed. This includes oxygen and any inhaled medicines.  Keep all follow-up appointments as directed by your health care provider. SEEK MEDICAL CARE IF:   Your condition does not improve in the time expected.  You have a hard time doing your normal activities even with  rest.  You have any new symptoms. SEEK IMMEDIATE MEDICAL CARE IF:   Your shortness of breath gets worse.  You feel light-headed, faint, or develop a cough not controlled with medicines.  You start coughing up blood.  You have pain with breathing.  You have chest pain or pain in your arms, shoulders, or abdomen.  You have a fever.  You are unable to walk up stairs or exercise the way you normally do. MAKE SURE YOU:  Understand these instructions.  Will watch your condition.  Will get help right away if you are not doing well or get worse. Document Released: 09/03/2001 Document Revised: 12/14/2013 Document Reviewed: 02/24/2012 [facility] Patient Information 2015 [place], [place]. This information is not intended to replace advice given to you by your health care provider. Make sure you discuss any questions you have with your health care provider.

## 2015-03-22 ENCOUNTER — Encounter (HOSPITAL_COMMUNITY): Payer: Self-pay | Admitting: Emergency Medicine

## 2015-03-22 ENCOUNTER — Inpatient Hospital Stay (HOSPITAL_COMMUNITY)
Admission: EM | Admit: 2015-03-22 | Discharge: 2015-03-24 | DRG: 203 | Disposition: A | Discharge status: Home / Self Care | Payer: Medicaid Other | Attending: Internal Medicine | Admitting: Internal Medicine

## 2015-03-22 ENCOUNTER — Emergency Department (HOSPITAL_COMMUNITY): Payer: Medicaid Other

## 2015-03-22 DIAGNOSIS — J42 Unspecified chronic bronchitis: Secondary | ICD-10-CM | POA: Diagnosis present

## 2015-03-22 DIAGNOSIS — R05 Cough: Secondary | ICD-10-CM | POA: Insufficient documentation

## 2015-03-22 DIAGNOSIS — Z79899 Other long term (current) drug therapy: Secondary | ICD-10-CM

## 2015-03-22 DIAGNOSIS — J209 Acute bronchitis, unspecified: Secondary | ICD-10-CM | POA: Diagnosis present

## 2015-03-22 DIAGNOSIS — J45901 Unspecified asthma with (acute) exacerbation: Secondary | ICD-10-CM

## 2015-03-22 DIAGNOSIS — F17210 Nicotine dependence, cigarettes, uncomplicated: Secondary | ICD-10-CM | POA: Diagnosis present

## 2015-03-22 DIAGNOSIS — R059 Cough, unspecified: Secondary | ICD-10-CM

## 2015-03-22 DIAGNOSIS — J45909 Unspecified asthma, uncomplicated: Secondary | ICD-10-CM | POA: Insufficient documentation

## 2015-03-22 DIAGNOSIS — Z720 Tobacco use: Secondary | ICD-10-CM | POA: Diagnosis present

## 2015-03-22 DIAGNOSIS — Z825 Family history of asthma and other chronic lower respiratory diseases: Secondary | ICD-10-CM

## 2015-03-22 DIAGNOSIS — Z8669 Personal history of other diseases of the nervous system and sense organs: Secondary | ICD-10-CM

## 2015-03-22 LAB — BASIC METABOLIC PANEL
Anion gap: 9 (ref 5–15)
BUN: 7 mg/dL (ref 6–23)
CO2: 25 mmol/L (ref 19–32)
CREATININE: 0.62 mg/dL (ref 0.50–1.10)
Calcium: 8.7 mg/dL (ref 8.4–10.5)
Chloride: 107 mmol/L (ref 96–112)
GFR calc Af Amer: >90 mL/min (ref >90)
GFR calc non Af Amer: >90 mL/min (ref >90)
GLUCOSE: 86 mg/dL (ref 70–99)
POTASSIUM: 3.6 mmol/L (ref 3.5–5.1)
SODIUM: 141 mmol/L (ref 135–145)

## 2015-03-22 LAB — CBC WITH DIFFERENTIAL/PLATELET
BASOS PCT: 1 % (ref 0–1)
Basophils Absolute: 0.1 10*3/uL (ref 0.0–0.1)
EOS ABS: 0.4 10*3/uL (ref 0.0–0.7)
EOS PCT: 5 % (ref 0–5)
HCT: 36.5 % (ref 36.0–46.0)
Hemoglobin: 11.8 g/dL — ABNORMAL LOW (ref 12.0–15.0)
LYMPHS PCT: 34 % (ref 12–46)
Lymphs Abs: 2.4 10*3/uL (ref 0.7–4.0)
MCH: 29.6 pg (ref 26.0–34.0)
MCHC: 32.3 g/dL (ref 30.0–36.0)
MCV: 91.7 fL (ref 78.0–100.0)
MONOS PCT: 12 % (ref 3–12)
Monocytes Absolute: 0.8 10*3/uL (ref 0.1–1.0)
Neutro Abs: 3.3 10*3/uL (ref 1.7–7.7)
Neutrophils Relative %: 48 % (ref 43–77)
Platelets: 245 10*3/uL (ref 150–400)
RBC: 3.98 MIL/uL (ref 3.87–5.11)
RDW: 13.3 % (ref 11.5–15.5)
WBC: 6.9 10*3/uL (ref 4.0–10.5)

## 2015-03-22 LAB — TROPONIN I

## 2015-03-22 MED ORDER — METHYLPREDNISOLONE SODIUM SUCC 125 MG IJ SOLR
125.0000 mg | Freq: Once | INTRAMUSCULAR | Status: AC
  Administered 2015-03-22: 125 mg via INTRAVENOUS
  Filled 2015-03-22: qty 2

## 2015-03-22 MED ORDER — IBUPROFEN 200 MG PO TABS
600.0000 mg | ORAL_TABLET | Freq: Once | ORAL | Status: AC
  Administered 2015-03-22: 600 mg via ORAL
  Filled 2015-03-22: qty 3

## 2015-03-22 MED ORDER — IPRATROPIUM BROMIDE 0.02 % IN SOLN
1.0000 mg | Freq: Once | RESPIRATORY_TRACT | Status: AC
  Administered 2015-03-22: 1 mg via RESPIRATORY_TRACT
  Filled 2015-03-22: qty 5

## 2015-03-22 MED ORDER — ALBUTEROL SULFATE (2.5 MG/3ML) 0.083% IN NEBU
5.0000 mg | INHALATION_SOLUTION | Freq: Once | RESPIRATORY_TRACT | Status: AC
  Administered 2015-03-22: 5 mg via RESPIRATORY_TRACT
  Filled 2015-03-22: qty 6

## 2015-03-22 MED ORDER — SODIUM CHLORIDE 0.9 % IV BOLUS (SEPSIS)
1000.0000 mL | Freq: Once | INTRAVENOUS | Status: AC
  Administered 2015-03-22: 1000 mL via INTRAVENOUS

## 2015-03-22 MED ORDER — AZITHROMYCIN 250 MG PO TABS
500.0000 mg | ORAL_TABLET | Freq: Once | ORAL | Status: AC
  Administered 2015-03-22: 500 mg via ORAL
  Filled 2015-03-22: qty 2

## 2015-03-22 NOTE — ED Notes (Signed)
Pt started taking her meds for bronchitis yesterday.  Pt states that she was told to come back in if not getting any better.  Pt states that she has persistent cough and blood in her mucous when she blows her nose.  Pt's daughter states that last night her mom didn't snore and she was having a hard time seeing if pt was breathing or not while sleeping.

## 2015-03-22 NOTE — ED Provider Notes (Signed)
CSN: [id number]     Arrival date & time 03/22/15  1552 History   First MD Initiated Contact with Patient 03/22/15 1624     Chief Complaint  Patient presents with  . persistent cough   . Bronchitis     (Consider location/radiation/quality/duration/timing/severity/associated sxs/prior Treatment) Patient is a 34 y.o. female presenting with cough.  Cough Cough characteristics:  Productive Sputum characteristics: bloody mucus. Severity:  Severe Onset quality:  Gradual Duration:  5 days Timing:  Constant Progression:  Unchanged Chronicity:  New Smoker: yes   Context comment:  Seen in the emergency department 2 days ago for the same symptoms. At that time a CTA PE study was negative. Relieved by:  Beta-agonist inhaler Exacerbated by: Exposure to hot air. Associated symptoms: chest pain, shortness of breath and sinus congestion   Associated symptoms: no fever   Associated symptoms comment:  Sinus pressure Lightheadedness   Past Medical History  Diagnosis Date  . Irritable bowel   . Asthma     meds as needed  . Ectopic pregnancy     requiring surgery  . Gonorrhea   . Preterm labor   . Arthritis   . Chronic back pain    Past Surgical History  Procedure Laterality Date  . Wisdom tooth extraction    . Tubal ligation  2005  . Laparoscopy  05/30/2012    Procedure: LAPAROSCOPY OPERATIVE;  Surgeon: Osborne Oman, MD;  Location: [facility];  Service: Gynecology;  Laterality: N/A;  Operative laparoscopy right salpingectomy and removal of ectopic pregnancy  . Salpingectomy    . Laparoscopy for ectopic pregnancy     No family history on file. History  Substance Use Topics  . Smoking status: Current Every Day Smoker -- 0.25 packs/day    Types: Cigarettes  . Smokeless tobacco: Never Used  . Alcohol Use: Yes     Comment: occasional   OB History    Gravida Para Term Preterm AB TAB SAB Ectopic Multiple Living   6 3 2 1 2  1 1  3      Review of Systems  Constitutional: Negative  for fever.  Respiratory: Positive for cough and shortness of breath.   Cardiovascular: Positive for chest pain.  All other systems reviewed and are negative.     Allergies  Hydrocodeine; Latex; Raspberry; and Vioxx  Home Medications   Prior to Admission medications   Medication Sig Start Date End Date Taking? Authorizing Provider  albuterol (PROVENTIL HFA;VENTOLIN HFA) 108 (90 BASE) MCG/ACT inhaler Inhale 1-2 puffs into the lungs every 6 (six) hours as needed for wheezing or shortness of breath. 03/21/15  Yes Tiffany Carlota Raspberry, PA-C  azithromycin (ZITHROMAX) 250 MG tablet Take 1 tablet (250 mg total) by mouth daily. Take first 2 tablets together, then 1 every day until finished. 03/21/15  Yes Tiffany Carlota Raspberry, PA-C  buPROPion (WELLBUTRIN XL) 150 MG 24 hr tablet Take 150 mg by mouth 2 (two) times daily.   Yes Historical Provider, MD  ibuprofen (ADVIL,MOTRIN) 800 MG tablet Take 800 mg by mouth every 8 (eight) hours as needed for moderate pain (pain).    Yes Historical Provider, MD  Phenylephrine-Acetaminophen (SINUS CONGESTION/PAIN DAYTIME PO) Take 2 tablets by mouth daily as needed (congestion).   Yes Historical Provider, MD  propranolol (INDERAL) 60 MG tablet Take 1 tablet (60 mg total) by mouth 2 (two) times daily. 05/09/14  Yes Marcial Pacas, MD  sodium chloride (OCEAN) 0.65 % SOLN nasal spray Place 1 spray into both nostrils daily as needed  for congestion (congestion).   Yes Historical Provider, MD  nortriptyline (PAMELOR) 10 MG capsule Take 20 mg by mouth at bedtime.    Historical Provider, MD  predniSONE (DELTASONE) 10 MG tablet Take 2 tablets (20 mg total) by mouth daily. 03/21/15   Tiffany Carlota Raspberry, PA-C   BP 129/76 mmHg  Pulse 85  Temp(Src) 97.9 F (36.6 C) (Oral)  SpO2 100%  LMP 03/11/2015 Physical Exam  Constitutional: She is oriented to person, place, and time. She appears well-developed and well-nourished. No distress.  HENT:  Head: Normocephalic and atraumatic.  Mouth/Throat:  Oropharynx is clear and moist.  Eyes: Conjunctivae are normal. Pupils are equal, round, and reactive to light. No scleral icterus.  Neck: Neck supple.  Cardiovascular: Normal rate, regular rhythm, normal heart sounds and intact distal pulses.   No murmur heard. Pulmonary/Chest: Effort normal. No stridor. No respiratory distress. She has wheezes (With forced expiration). She has no rales.  Minimal increase in work of breathing  Abdominal: Soft. Bowel sounds are normal. She exhibits no distension. There is no tenderness.  Musculoskeletal: Normal range of motion.  Neurological: She is alert and oriented to person, place, and time.  Skin: Skin is warm and dry. No rash noted.  Psychiatric: She has a normal mood and affect. Her behavior is normal.  Nursing note and vitals reviewed.   ED Course  Procedures (including critical care time) Labs Review Labs Reviewed  CBC WITH DIFFERENTIAL/PLATELET - Abnormal; Notable for the following:    Hemoglobin 11.8 (*)    All other components within normal limits  TROPONIN I  BASIC METABOLIC PANEL  POC URINE PREG, ED    Imaging Review Dg Chest 2 View  03/22/2015   CLINICAL DATA:  Worsening cough and chest pain, recent diagnosis of bronchitis.  EXAM: CHEST  2 VIEW  COMPARISON:  Radiographs and CT 03/20/2015  FINDINGS: Central bronchial thickening, mildly progressed from prior. No developing consolidation. The cardiomediastinal contours are normal. Pulmonary vasculature is normal. No pleural effusion or pneumothorax. No acute osseous abnormalities are seen.  IMPRESSION: Central bronchial thickening, mildly progressed from prior. No localizing pulmonary process.   Electronically Signed   By: Jeb Levering M.D.   On: 03/22/2015 17:39  All radiology studies independently viewed by me.      EKG Interpretation   Date/Time:  Wednesday March 22 2015 18:32:15 EDT Ventricular Rate:  75 PR Interval:  135 QRS Duration: 78 QT Interval:  412 QTC Calculation:  460 R Axis:   67 Text Interpretation:  Sinus rhythm No significant change was found  Confirmed by [facility]  MD, TREY ([id number]) on 03/22/2015 9:20:19 PM      MDM   Final diagnoses:  Cough  Acute asthma exacerbation, unspecified asthma severity    34 year old female presenting with cough, URI symptoms, chest pain, and shortness of breath. She isn't asthmatic. She continued to smoke until yesterday. She was seen in the emergency department a few days ago and was ruled out for PE. Her symptoms seem consistent with an acute URI with asthma exacerbation. She is currently taking prednisone. I suspect her symptoms were persistent due to continuing to smoke. We'll treat with albuterol nebulizer, fluids, and reassess.  After multiple albuterol treatments, patient continued to have increased work of breathing. Her O2 sats remained well, but she had persistent coughing and appeared ill. Added azithromycin. Although she initially endorsed taking steroids since her discharge 2 days ago, she ultimately admitted that she did not pick up her prescription. Therefore, given her  steroids now. She'll be admitted to internal medicine for continued treatments.  Serita Grit, MD 03/22/15 [phone]

## 2015-03-23 ENCOUNTER — Encounter (HOSPITAL_COMMUNITY): Payer: Self-pay | Admitting: Internal Medicine

## 2015-03-23 DIAGNOSIS — Z8669 Personal history of other diseases of the nervous system and sense organs: Secondary | ICD-10-CM

## 2015-03-23 DIAGNOSIS — Z79899 Other long term (current) drug therapy: Secondary | ICD-10-CM | POA: Diagnosis not present

## 2015-03-23 DIAGNOSIS — J209 Acute bronchitis, unspecified: Secondary | ICD-10-CM

## 2015-03-23 DIAGNOSIS — R059 Cough, unspecified: Secondary | ICD-10-CM | POA: Insufficient documentation

## 2015-03-23 DIAGNOSIS — J45901 Unspecified asthma with (acute) exacerbation: Secondary | ICD-10-CM | POA: Diagnosis not present

## 2015-03-23 DIAGNOSIS — Z825 Family history of asthma and other chronic lower respiratory diseases: Secondary | ICD-10-CM | POA: Diagnosis not present

## 2015-03-23 DIAGNOSIS — R0602 Shortness of breath: Secondary | ICD-10-CM | POA: Diagnosis present

## 2015-03-23 DIAGNOSIS — J42 Unspecified chronic bronchitis: Secondary | ICD-10-CM | POA: Diagnosis present

## 2015-03-23 DIAGNOSIS — Z720 Tobacco use: Secondary | ICD-10-CM | POA: Diagnosis not present

## 2015-03-23 DIAGNOSIS — F17210 Nicotine dependence, cigarettes, uncomplicated: Secondary | ICD-10-CM | POA: Diagnosis present

## 2015-03-23 DIAGNOSIS — R05 Cough: Secondary | ICD-10-CM

## 2015-03-23 LAB — CBC WITH DIFFERENTIAL/PLATELET
BASOS ABS: 0 10*3/uL (ref 0.0–0.1)
Basophils Relative: 1 % (ref 0–1)
EOS PCT: 0 % (ref 0–5)
Eosinophils Absolute: 0 10*3/uL (ref 0.0–0.7)
HCT: 38.3 % (ref 36.0–46.0)
Hemoglobin: 12.7 g/dL (ref 12.0–15.0)
Lymphocytes Relative: 15 % (ref 12–46)
Lymphs Abs: 1 10*3/uL (ref 0.7–4.0)
MCH: 30.2 pg (ref 26.0–34.0)
MCHC: 33.2 g/dL (ref 30.0–36.0)
MCV: 91.2 fL (ref 78.0–100.0)
MONO ABS: 0.1 10*3/uL (ref 0.1–1.0)
Monocytes Relative: 2 % — ABNORMAL LOW (ref 3–12)
Neutro Abs: 5.4 10*3/uL (ref 1.7–7.7)
Neutrophils Relative %: 82 % — ABNORMAL HIGH (ref 43–77)
Platelets: 241 10*3/uL (ref 150–400)
RBC: 4.2 MIL/uL (ref 3.87–5.11)
RDW: 13 % (ref 11.5–15.5)
WBC: 6.6 10*3/uL (ref 4.0–10.5)

## 2015-03-23 LAB — BLOOD GAS, ARTERIAL
Acid-base deficit: 1.6 mmol/L (ref 0.0–2.0)
BICARBONATE: 21.6 meq/L (ref 20.0–24.0)
Drawn by: 308601
FIO2: 0.21 %
O2 Saturation: 98.6 %
PCO2 ART: 33 mmHg — AB (ref 35.0–45.0)
PH ART: 7.431 (ref 7.350–7.450)
PO2 ART: 114 mmHg — AB (ref 80.0–100.0)
Patient temperature: 98.6
TCO2: 19.4 mmol/L (ref 0–100)

## 2015-03-23 LAB — COMPREHENSIVE METABOLIC PANEL
ALK PHOS: 128 U/L — AB (ref 39–117)
ALT: 23 U/L (ref 0–35)
AST: 20 U/L (ref 0–37)
Albumin: 4.2 g/dL (ref 3.5–5.2)
Anion gap: 5 (ref 5–15)
BILIRUBIN TOTAL: 0.4 mg/dL (ref 0.3–1.2)
BUN: 9 mg/dL (ref 6–23)
CHLORIDE: 107 mmol/L (ref 96–112)
CO2: 23 mmol/L (ref 19–32)
Calcium: 8.7 mg/dL (ref 8.4–10.5)
Creatinine, Ser: 0.71 mg/dL (ref 0.50–1.10)
GFR calc Af Amer: >90 mL/min (ref >90)
GFR calc non Af Amer: >90 mL/min (ref >90)
GLUCOSE: 135 mg/dL — AB (ref 70–99)
Potassium: 4.5 mmol/L (ref 3.5–5.1)
Sodium: 135 mmol/L (ref 135–145)
Total Protein: 7.3 g/dL (ref 6.0–8.3)

## 2015-03-23 LAB — INFLUENZA PANEL BY PCR (TYPE A & B)
H1N1 flu by pcr: NOT DETECTED
INFLBPCR: NEGATIVE
Influenza A By PCR: NEGATIVE

## 2015-03-23 MED ORDER — ACETAMINOPHEN 650 MG RE SUPP: 650.0000 mg | Freq: Four times a day (QID) | RECTAL | Status: DC | PRN

## 2015-03-23 MED ORDER — ONDANSETRON HCL 4 MG PO TABS: 4.0000 mg | ORAL_TABLET | Freq: Four times a day (QID) | ORAL | Status: DC | PRN

## 2015-03-23 MED ORDER — ALBUTEROL SULFATE (2.5 MG/3ML) 0.083% IN NEBU
2.5000 mg | INHALATION_SOLUTION | RESPIRATORY_TRACT | Status: DC
  Administered 2015-03-23: 2.5 mg via RESPIRATORY_TRACT

## 2015-03-23 MED ORDER — PROPRANOLOL HCL 60 MG PO TABS
60.0000 mg | ORAL_TABLET | Freq: Two times a day (BID) | ORAL | Status: DC
  Administered 2015-03-23 – 2015-03-24 (×4): 60 mg via ORAL
  Filled 2015-03-23 (×5): qty 1

## 2015-03-23 MED ORDER — ENOXAPARIN SODIUM 40 MG/0.4ML ~~LOC~~ SOLN
40.0000 mg | SUBCUTANEOUS | Status: DC
  Administered 2015-03-23 – 2015-03-24 (×2): 40 mg via SUBCUTANEOUS
  Filled 2015-03-23 (×2): qty 0.4

## 2015-03-23 MED ORDER — SODIUM CHLORIDE 0.9 % IJ SOLN
3.0000 mL | Freq: Two times a day (BID) | INTRAMUSCULAR | Status: DC
  Administered 2015-03-23 (×3): 3 mL via INTRAVENOUS

## 2015-03-23 MED ORDER — ONDANSETRON HCL 4 MG/2ML IJ SOLN: 4.0000 mg | Freq: Four times a day (QID) | INTRAMUSCULAR | Status: DC | PRN

## 2015-03-23 MED ORDER — ACETAMINOPHEN 325 MG PO TABS
650.0000 mg | ORAL_TABLET | Freq: Four times a day (QID) | ORAL | Status: DC | PRN
  Administered 2015-03-23 – 2015-03-24 (×2): 650 mg via ORAL
  Filled 2015-03-23 (×2): qty 2

## 2015-03-23 MED ORDER — BUDESONIDE 0.25 MG/2ML IN SUSP
0.2500 mg | Freq: Two times a day (BID) | RESPIRATORY_TRACT | Status: DC
  Administered 2015-03-23 – 2015-03-24 (×4): 0.25 mg via RESPIRATORY_TRACT
  Filled 2015-03-23 (×4): qty 2

## 2015-03-23 MED ORDER — NORTRIPTYLINE HCL 10 MG PO CAPS
20.0000 mg | ORAL_CAPSULE | Freq: Every day | ORAL | Status: DC
  Administered 2015-03-23 (×2): 20 mg via ORAL
  Filled 2015-03-23 (×3): qty 2

## 2015-03-23 MED ORDER — BUPROPION HCL ER (XL) 150 MG PO TB24
150.0000 mg | ORAL_TABLET | Freq: Two times a day (BID) | ORAL | Status: DC
  Administered 2015-03-23 – 2015-03-24 (×4): 150 mg via ORAL
  Filled 2015-03-23 (×5): qty 1

## 2015-03-23 MED ORDER — METHYLPREDNISOLONE SODIUM SUCC 125 MG IJ SOLR
60.0000 mg | Freq: Two times a day (BID) | INTRAMUSCULAR | Status: DC
  Administered 2015-03-23 – 2015-03-24 (×3): 60 mg via INTRAVENOUS
  Filled 2015-03-23 (×5): qty 0.96

## 2015-03-23 MED ORDER — ALBUTEROL SULFATE (2.5 MG/3ML) 0.083% IN NEBU
2.5000 mg | INHALATION_SOLUTION | Freq: Three times a day (TID) | RESPIRATORY_TRACT | Status: DC
  Administered 2015-03-23 – 2015-03-24 (×4): 2.5 mg via RESPIRATORY_TRACT
  Filled 2015-03-23 (×4): qty 3

## 2015-03-23 MED ORDER — FENTANYL CITRATE 0.05 MG/ML IJ SOLN
25.0000 ug | INTRAMUSCULAR | Status: DC | PRN
  Administered 2015-03-23: 25 ug via INTRAVENOUS
  Filled 2015-03-23: qty 2

## 2015-03-23 MED ORDER — ALBUTEROL SULFATE (2.5 MG/3ML) 0.083% IN NEBU
2.5000 mg | INHALATION_SOLUTION | RESPIRATORY_TRACT | Status: DC | PRN
  Filled 2015-03-23: qty 3

## 2015-03-23 MED ORDER — ALBUTEROL SULFATE (2.5 MG/3ML) 0.083% IN NEBU: 2.5000 mg | INHALATION_SOLUTION | Freq: Four times a day (QID) | RESPIRATORY_TRACT | Status: DC | PRN

## 2015-03-23 MED ORDER — DEXTROSE 5 % IV SOLN
500.0000 mg | INTRAVENOUS | Status: DC
  Administered 2015-03-23: 500 mg via INTRAVENOUS
  Filled 2015-03-23: qty 500

## 2015-03-23 NOTE — Progress Notes (Signed)
Patient seen and examined at bedside Patient admitted for worsening shortness of breath, cough. She was found to have bronchitis. Flu test is pending.  Physical exam Gen.: No acute distress Respiratory: Coarse breath sounds, no wheezing Abdomen: Nontender, nondistended, pressure bowel sounds  Assessment and plan  Acute on chronic bronchitis - Started treatment with azithromycin. Follow-up on influenza panel.  Leisa Lenz [facility] [phone]

## 2015-03-23 NOTE — H&P (Signed)
[facility] History and Physical  Kody Vigil VQX:[id number] DOB: 08-13-1981 DOA: 03/22/2015  Referring physician: ER physician. PCP: Elyn Peers, MD  Chief Complaint: Shortness of breath.  HPI: DATHA KISSINGER Toney Rakes is a 34 y.o. female with history of bronchial asthma, tobacco abuse, migraine presents to the ER because of worsening shortness of breath. Patient's symptoms started 5 days ago and had come to the ER 2 days ago. Patient has been having shortness of breath with wheezing and productive cough. Has been having some pleuritic-type of chest pain. When patient had come 2 days ago patient had CT and exam of the chest was negative for PE. Showed features concerning for bronchitis. Patient was prescribed medications which patient had only picked up today. In the ER patient was found to be wheezing despite nebulizer treatments. Patient will be admitted for further management of asthma exacerbation. Patient states that she has not had asthma exacerbation for many years now. Denies any fever or chills. Denies any nausea vomiting abdominal pain diarrhea fever chills or sick contacts.   Review of Systems: As presented in the history of presenting illness, rest negative.  Past Medical History  Diagnosis Date  . Irritable bowel   . Asthma     meds as needed  . Ectopic pregnancy     requiring surgery  . Gonorrhea   . Preterm labor   . Arthritis   . Chronic back pain    Past Surgical History  Procedure Laterality Date  . Wisdom tooth extraction    . Tubal ligation  2005  . Laparoscopy  05/30/2012    Procedure: LAPAROSCOPY OPERATIVE;  Surgeon: Osborne Oman, MD;  Location: [facility];  Service: Gynecology;  Laterality: N/A;  Operative laparoscopy right salpingectomy and removal of ectopic pregnancy  . Salpingectomy    . Laparoscopy for ectopic pregnancy     Social History:  reports that she has been smoking Cigarettes.  She has been smoking about 0.25 packs per day. She  has never used smokeless tobacco. She reports that she drinks alcohol. She reports that she does not use illicit drugs. Where does patient live home. Can patient participate in ADLs? Yes.  Allergies  Allergen Reactions  . Hydrocodeine [Dihydrocodeine] Hives  . Latex Hives  . Raspberry Hives  . Vioxx [Rofecoxib] Hives    Family History:  Family History  Problem Relation Age of Onset  . Asthma Brother       Prior to Admission medications   Medication Sig Start Date End Date Taking? Authorizing Provider  albuterol (PROVENTIL HFA;VENTOLIN HFA) 108 (90 BASE) MCG/ACT inhaler Inhale 1-2 puffs into the lungs every 6 (six) hours as needed for wheezing or shortness of breath. 03/21/15  Yes Tiffany Carlota Raspberry, PA-C  azithromycin (ZITHROMAX) 250 MG tablet Take 1 tablet (250 mg total) by mouth daily. Take first 2 tablets together, then 1 every day until finished. 03/21/15  Yes Tiffany Carlota Raspberry, PA-C  buPROPion (WELLBUTRIN XL) 150 MG 24 hr tablet Take 150 mg by mouth 2 (two) times daily.   Yes Historical Provider, MD  ibuprofen (ADVIL,MOTRIN) 800 MG tablet Take 800 mg by mouth every 8 (eight) hours as needed for moderate pain (pain).    Yes Historical Provider, MD  Phenylephrine-Acetaminophen (SINUS CONGESTION/PAIN DAYTIME PO) Take 2 tablets by mouth daily as needed (congestion).   Yes Historical Provider, MD  propranolol (INDERAL) 60 MG tablet Take 1 tablet (60 mg total) by mouth 2 (two) times daily. 05/09/14  Yes Marcial Pacas, MD  sodium chloride (OCEAN) 0.65 % SOLN nasal spray Place 1 spray into both nostrils daily as needed for congestion (congestion).   Yes Historical Provider, MD  nortriptyline (PAMELOR) 10 MG capsule Take 20 mg by mouth at bedtime.    Historical Provider, MD  predniSONE (DELTASONE) 10 MG tablet Take 2 tablets (20 mg total) by mouth daily. 03/21/15   Delos Haring, PA-C    Physical Exam: Filed Vitals:   03/22/15 1947 03/22/15 2210 03/22/15 2247 03/22/15 2335  BP: 138/83 135/85 135/85  133/83  Pulse: 80 79 88 76  Temp: 98.1 F (36.7 C)   98 F (36.7 C)  TempSrc: Oral   Oral  Resp: 22 18 22 22   SpO2: 100% 98% 100% 100%     General:  Well-developed and nourished.  Eyes: Anicteric no pallor.  ENT: No discharge from the ears eyes nose and mouth.  Neck: No mass felt.  Cardiovascular: S1-S2 heard.  Respiratory: Bilateral expiratory wheeze or crepitations.  Abdomen: Soft nontender bowel sounds present.  Skin: No rash.  Musculoskeletal: No edema.  Psychiatric: Appears normal.  Neurologic: Alert awake oriented to time place and person. Moves all extremities.  Labs on Admission:  Basic Metabolic Panel:  Recent Labs Lab 03/20/15 2236 03/22/15 1746  NA 140 141  K 3.5 3.6  CL 110 107  CO2 24 25  GLUCOSE 110* 86  BUN 7 7  CREATININE 0.70 0.62  CALCIUM 8.6 8.7   Liver Function Tests:  Recent Labs Lab 03/20/15 2236  AST 25  ALT 22  ALKPHOS 125*  BILITOT 0.5  PROT 6.9  ALBUMIN 3.8   No results for input(s): LIPASE, AMYLASE in the last 168 hours. No results for input(s): AMMONIA in the last 168 hours. CBC:  Recent Labs Lab 03/20/15 2236 03/22/15 1746  WBC 6.8 6.9  NEUTROABS 3.0 3.3  HGB 12.0 11.8*  HCT 39.5 36.5  MCV 99.2 91.7  PLT 240 245   Cardiac Enzymes:  Recent Labs Lab 03/22/15 1746  TROPONINI <0.03    BNP (last 3 results) No results for input(s): BNP in the last 8760 hours.  ProBNP (last 3 results) No results for input(s): PROBNP in the last 8760 hours.  CBG: No results for input(s): GLUCAP in the last 168 hours.  Radiological Exams on Admission: Dg Chest 2 View  03/22/2015   CLINICAL DATA:  Worsening cough and chest pain, recent diagnosis of bronchitis.  EXAM: CHEST  2 VIEW  COMPARISON:  Radiographs and CT 03/20/2015  FINDINGS: Central bronchial thickening, mildly progressed from prior. No developing consolidation. The cardiomediastinal contours are normal. Pulmonary vasculature is normal. No pleural effusion or  pneumothorax. No acute osseous abnormalities are seen.  IMPRESSION: Central bronchial thickening, mildly progressed from prior. No localizing pulmonary process.   Electronically Signed   By: Jeb Levering M.D.   On: 03/22/2015 17:39    EKG: Independently reviewed. Normal sinus rhythm.  Assessment/Plan Principal Problem:   Asthma exacerbation Active Problems:   Tobacco abuse   History of migraine   1. Acute asthma exacerbation - patient at this time is able to complete sentences without difficulty. We will admit patient to telemetry. Check ABG. Continue with IV Solu-Medrol and I have placed patient on 60 mg every 12 hourly. I have placed patient on Pulmicort and albuterol nebulizer. Check influenza PCR. Patient is on propranolol for migraine prophylaxis which may need to be slowly tapered off given history of asthma. Have placed patient on Zithromax as patient was complaining of productive  cough. 2. Tobacco abuse - patient advised to quit smoking. 3. History of migraine - see #1 with regarding to propranolol. Presently no headache.   DVT Prophylaxis Lovenox.  Code Status: Full code.  Family Communication: None.  Disposition Plan: Admit to inpatient.    Gatlyn Lipari N. [facility] Pager [phone].  If 7PM-7AM, please contact night-coverage [email or web address] Password [email or web address] 03/23/2015, 12:14 AM

## 2015-03-24 DIAGNOSIS — Z720 Tobacco use: Secondary | ICD-10-CM

## 2015-03-24 DIAGNOSIS — J45901 Unspecified asthma with (acute) exacerbation: Principal | ICD-10-CM

## 2015-03-24 MED ORDER — ALBUTEROL SULFATE (2.5 MG/3ML) 0.083% IN NEBU: 2.5000 mg | INHALATION_SOLUTION | Freq: Four times a day (QID) | RESPIRATORY_TRACT | Status: DC | PRN

## 2015-03-24 MED ORDER — AZITHROMYCIN 500 MG PO TABS
500.0000 mg | ORAL_TABLET | Freq: Every day | ORAL | Status: DC
  Filled 2015-03-24: qty 1

## 2015-03-24 MED ORDER — AZITHROMYCIN 500 MG PO TABS: 500.0000 mg | ORAL_TABLET | Freq: Every day | ORAL | Status: DC

## 2015-03-24 MED ORDER — PREDNISONE 5 MG PO TABS
50.0000 mg | ORAL_TABLET | Freq: Every day | ORAL | Status: DC
Start: 2015-03-24 — End: 2015-08-06

## 2015-03-24 NOTE — Discharge Summary (Signed)
Physician Discharge Summary  [name] [id number] DOB: March 21, 1981 DOA: 03/22/2015  PCP: Elyn Peers, MD  Admit date: 03/22/2015 Discharge date: 03/24/2015  Recommendations for Outpatient Follow-up:  1. Take azithromycin for 5 more days on discharge for bronchitis. 2. Taper down prednisone starting from 50 mg a day, taper down by 5 mg a day down to 0 mg. for ex, today 50 mg, tomorrow 45 mg, then 40 mg the following day and etc...  Discharge Diagnoses:  Principal Problem:   Asthma exacerbation Active Problems:   Tobacco abuse   History of migraine   Cough   Acute bronchitis    Discharge Condition: stable   Diet recommendation: as tolerated   History of present illness:  34 y.o. female with history of bronchial asthma, tobacco abuse, migraine who presented to [facility] hospital with worsening shortness of breath started about 5 days prior to this admission. Patient reported associated wheezing, productive cough and chest pain with coughing. Patient was seen in ED on 03/20/2015. CT scan done on 03/20/2015 was negative for pulmonary embolism but it shows features concerning for bronchitis. She was subsequently sent home and then came back after 2 days because of worsening shortness of breath and because her symptoms did not improved. X-ray of this admission showed central bronchial thickening. She was started on azithromycin. Flu test was negative.    Hospital Course:   Principal Problem:   Asthma exacerbation / Acute bronchitis - Patient feels better this morning. She maintains oxygen saturation above 95%. - As mentioned above, chest x-ray points towards bronchitis. - Influenza panel negative. - Patient started on azithromycin. She will continue azithromycin for 5 days on discharge. - Order placed for home nebulizer machine. She will continue albuterol inhaler as well as nebulizer if needed for shortness of breath or wheezing - Prednisone taper  prescribed.  Active Problems:   Tobacco abuse - Counseled on cessation    Signed:  Leisa Lenz, MD  [facility] 03/24/2015, 9:12 AM  Pager #: [phone]  Procedures:  None   Consultations:  None   Discharge Exam: Filed Vitals:   03/24/15 0617  BP: 119/72  Pulse: 68  Temp: 97.9 F (36.6 C)  Resp: 16   Filed Vitals:   03/23/15 1523 03/23/15 1812 03/23/15 2129 03/24/15 0617  BP: 125/67  141/76 119/72  Pulse: 76  81 68  Temp: 98.8 F (37.1 C)  98.2 F (36.8 C) 97.9 F (36.6 C)  TempSrc: Oral  Oral Oral  Resp: 18  18 16   Height: 5\' 4"  (1.626 m)     Weight: 92.987 kg (205 lb)     SpO2: 97% 97% 96% 98%    General: Pt is alert, follows commands appropriately, not in acute distress Cardiovascular: Regular rate and rhythm, S1/S2 +, no murmurs Respiratory: Clear to auscultation bilaterally, no wheezing, no crackles, no rhonchi Abdominal: Soft, non tender, non distended, bowel sounds +, no guarding Extremities: no edema, no cyanosis, pulses palpable bilaterally DP and PT Neuro: Grossly nonfocal  Discharge Instructions  Discharge Instructions    Call MD for:  difficulty breathing, headache or visual disturbances    Complete by:  As directed      Call MD for:  persistant nausea and vomiting    Complete by:  As directed      Call MD for:  severe uncontrolled pain    Complete by:  As directed      Diet - low sodium heart healthy    Complete by:  As directed      Discharge instructions    Complete by:  As directed   1. Take azithromycin for 5 more days on discharge for bronchitis. 2. Taper down prednisone starting from 50 mg a day, taper down by 5 mg a day down to 0 mg. for ex, today 50 mg, tomorrow 45 mg, then 40 mg the following day and etc...     Increase activity slowly    Complete by:  As directed             Medication List    STOP taking these medications        SINUS CONGESTION/PAIN DAYTIME PO      TAKE these medications         albuterol 108 (90 BASE) MCG/ACT inhaler  Commonly known as:  PROVENTIL HFA;VENTOLIN HFA  Inhale 1-2 puffs into the lungs every 6 (six) hours as needed for wheezing or shortness of breath.     albuterol (2.5 MG/3ML) 0.083% nebulizer solution  Commonly known as:  PROVENTIL  Take 3 mLs (2.5 mg total) by nebulization every 6 (six) hours as needed for wheezing or shortness of breath.     azithromycin 500 MG tablet  Commonly known as:  ZITHROMAX  Take 1 tablet (500 mg total) by mouth at bedtime.     buPROPion 150 MG 24 hr tablet  Commonly known as:  WELLBUTRIN XL  Take 150 mg by mouth 2 (two) times daily.     ibuprofen 800 MG tablet  Commonly known as:  ADVIL,MOTRIN  Take 800 mg by mouth every 8 (eight) hours as needed for moderate pain (pain).     nortriptyline 10 MG capsule  Commonly known as:  PAMELOR  Take 20 mg by mouth at bedtime.     predniSONE 5 MG tablet  Commonly known as:  DELTASONE  Take 10 tablets (50 mg total) by mouth daily with breakfast.     propranolol 60 MG tablet  Commonly known as:  INDERAL  Take 1 tablet (60 mg total) by mouth 2 (two) times daily.     sodium chloride 0.65 % Soln nasal spray  Commonly known as:  OCEAN  Place 1 spray into both nostrils daily as needed for congestion (congestion).           Follow-up Information    Follow up with Elyn Peers, MD. Schedule an appointment as soon as possible for a visit in 1 week.   Specialty:  Family Medicine   Why:  Follow up appt after recent hospitalization   Contact information:   [address] [place] [address] [phone]        The results of significant diagnostics from this hospitalization (including imaging, microbiology, ancillary and laboratory) are listed below for reference.    Significant Diagnostic Studies: Dg Chest 2 View  03/22/2015   CLINICAL DATA:  Worsening cough and chest pain, recent diagnosis of bronchitis.  EXAM: CHEST  2 VIEW  COMPARISON:  Radiographs and CT  03/20/2015  FINDINGS: Central bronchial thickening, mildly progressed from prior. No developing consolidation. The cardiomediastinal contours are normal. Pulmonary vasculature is normal. No pleural effusion or pneumothorax. No acute osseous abnormalities are seen.  IMPRESSION: Central bronchial thickening, mildly progressed from prior. No localizing pulmonary process.   Electronically Signed   By: Jeb Levering M.D.   On: 03/22/2015 17:39   Dg Chest 2 View  03/20/2015   CLINICAL DATA:  History of asthma with acute mid to  left chest pain and shortness of breath since last night.  EXAM: CHEST  2 VIEW  COMPARISON:  07/10/2014  FINDINGS: The heart size and mediastinal contours are within normal limits. Both lungs are clear. The visualized skeletal structures are unremarkable.  IMPRESSION: No active cardiopulmonary disease.   Electronically Signed   By: Jerilynn Mages.  Shick M.D.   On: 03/20/2015 21:49   Ct Angio Chest Pe W/cm &/or Wo Cm  03/20/2015   CLINICAL DATA:  Shortness of breath for 2 hr.  Chest tightness.  EXAM: CT ANGIOGRAPHY CHEST WITH CONTRAST  TECHNIQUE: Multidetector CT imaging of the chest was performed using the standard protocol during bolus administration of intravenous contrast. Multiplanar CT image reconstructions and MIPs were obtained to evaluate the vascular anatomy.  CONTRAST:  155mL OMNIPAQUE IOHEXOL 350 MG/ML SOLN  COMPARISON:  Radiographs earlier this day. Chest CT PE protocol 06/02/2012  FINDINGS: There are no filling defects within the pulmonary arteries to suggest pulmonary embolus.  The thoracic aorta is normal in caliber. No pleural or pericardial effusion. No mediastinal or hilar adenopathy.  Mild bronchial thickening and minimal bibasilar atelectasis. No consolidation, pulmonary nodule or mass.  No acute abnormality in the included upper abdomen. There are no acute or suspicious osseous abnormalities.  Review of the MIP images confirms the above findings.  IMPRESSION: 1. No pulmonary  embolus. 2. Central bronchial thickening, may reflect bronchitis or asthma.   Electronically Signed   By: Jeb Levering M.D.   On: 03/20/2015 23:58    Microbiology: No results found for this or any previous visit (from the past 240 hour(s)).   Labs: Basic Metabolic Panel:  Recent Labs Lab 03/20/15 2236 03/22/15 1746 03/23/15 0535  NA 140 141 135  K 3.5 3.6 4.5  CL 110 107 107  CO2 24 25 23   GLUCOSE 110* 86 135*  BUN 7 7 9   CREATININE 0.70 0.62 0.71  CALCIUM 8.6 8.7 8.7   Liver Function Tests:  Recent Labs Lab 03/20/15 2236 03/23/15 0535  AST 25 20  ALT 22 23  ALKPHOS 125* 128*  BILITOT 0.5 0.4  PROT 6.9 7.3  ALBUMIN 3.8 4.2   No results for input(s): LIPASE, AMYLASE in the last 168 hours. No results for input(s): AMMONIA in the last 168 hours. CBC:  Recent Labs Lab 03/20/15 2236 03/22/15 1746 03/23/15 0535  WBC 6.8 6.9 6.6  NEUTROABS 3.0 3.3 5.4  HGB 12.0 11.8* 12.7  HCT 39.5 36.5 38.3  MCV 99.2 91.7 91.2  PLT 240 245 241   Cardiac Enzymes:  Recent Labs Lab 03/22/15 1746  TROPONINI <0.03   BNP: BNP (last 3 results) No results for input(s): BNP in the last 8760 hours.  ProBNP (last 3 results) No results for input(s): PROBNP in the last 8760 hours.  CBG: No results for input(s): GLUCAP in the last 168 hours.  Time coordinating discharge: Over 30 minutes

## 2015-03-24 NOTE — Discharge Instructions (Signed)
Bronchospasm A bronchospasm is a spasm or tightening of the airways going into the lungs. During a bronchospasm breathing becomes more difficult because the airways get smaller. When this happens there can be coughing, a whistling sound when breathing (wheezing), and difficulty breathing. Bronchospasm is often associated with asthma, but not all patients who experience a bronchospasm have asthma. CAUSES  A bronchospasm is caused by inflammation or irritation of the airways. The inflammation or irritation may be triggered by:   Allergies (such as to animals, pollen, food, or mold). Allergens that cause bronchospasm may cause wheezing immediately after exposure or many hours later.   Infection. Viral infections are believed to be the most common cause of bronchospasm.   Exercise.   Irritants (such as pollution, cigarette smoke, strong odors, aerosol sprays, and paint fumes).   Weather changes. Winds increase molds and pollens in the air. Rain refreshes the air by washing irritants out. Cold air may cause inflammation.   Stress and emotional upset.  SIGNS AND SYMPTOMS   Wheezing.   Excessive nighttime coughing.   Frequent or severe coughing with a simple cold.   Chest tightness.   Shortness of breath.  DIAGNOSIS  Bronchospasm is usually diagnosed through a history and physical exam. Tests, such as chest X-rays, are sometimes done to look for other conditions. TREATMENT   Inhaled medicines can be given to open up your airways and help you breathe. The medicines can be given using either an inhaler or a nebulizer machine.  Corticosteroid medicines may be given for severe bronchospasm, usually when it is associated with asthma. HOME CARE INSTRUCTIONS   Always have a plan prepared for seeking medical care. Know when to call your health care provider and local emergency services (911 in the [place] Know where you can access local emergency care.  Only take medicines as  directed by your health care provider.  If you were prescribed an inhaler or nebulizer machine, ask your health care provider to explain how to use it correctly. Always use a spacer with your inhaler if you were given one.  It is necessary to remain calm during an attack. Try to relax and breathe more slowly.  Control your home environment in the following ways:   Change your heating and air conditioning filter at least once a month.   Limit your use of fireplaces and wood stoves.  Do not smoke and do not allow smoking in your home.   Avoid exposure to perfumes and fragrances.   Get rid of pests (such as roaches and mice) and their droppings.   Throw away plants if you see mold on them.   Keep your house clean and dust free.   Replace carpet with wood, tile, or vinyl flooring. Carpet can trap dander and dust.   Use allergy-proof pillows, mattress covers, and box spring covers.   Wash bed sheets and blankets every week in hot water and dry them in a dryer.   Use blankets that are made of polyester or cotton.   Wash hands frequently. SEEK MEDICAL CARE IF:   You have muscle aches.   You have chest pain.   The sputum changes from clear or white to yellow, green, gray, or bloody.   The sputum you cough up gets thicker.   There are problems that may be related to the medicine you are given, such as a rash, itching, swelling, or trouble breathing.  SEEK IMMEDIATE MEDICAL CARE IF:   You have worsening wheezing and coughing even   after taking your prescribed medicines.   You have increased difficulty breathing.   You develop severe chest pain. MAKE SURE YOU:   Understand these instructions.  Will watch your condition.  Will get help right away if you are not doing well or get worse. Document Released: 12/12/2003 Document Revised: 12/14/2013 Document Reviewed: 05/31/2013 Exit[employer] Patient Information 2015 [employer]. This information is not  intended to replace advice given to you by your health care provider. Make sure you discuss any questions you have with your health care provider. Acute Bronchitis Bronchitis is inflammation of the airways that extend from the windpipe into the lungs (bronchi). The inflammation often causes mucus to develop. This leads to a cough, which is the most common symptom of bronchitis.  In acute bronchitis, the condition usually develops suddenly and goes away over time, usually in a couple weeks. Smoking, allergies, and asthma can make bronchitis worse. Repeated episodes of bronchitis may cause further lung problems.  CAUSES Acute bronchitis is most often caused by the same virus that causes a cold. The virus can spread from person to person (contagious) through coughing, sneezing, and touching contaminated objects. SIGNS AND SYMPTOMS   Cough.   Fever.   Coughing up mucus.   Body aches.   Chest congestion.   Chills.   Shortness of breath.   Sore throat.  DIAGNOSIS  Acute bronchitis is usually diagnosed through a physical exam. Your health care provider will also ask you questions about your medical history. Tests, such as chest X-rays, are sometimes done to rule out other conditions.  TREATMENT  Acute bronchitis usually goes away in a couple weeks. Oftentimes, no medical treatment is necessary. Medicines are sometimes given for relief of fever or cough. Antibiotic medicines are usually not needed but may be prescribed in certain situations. In some cases, an inhaler may be recommended to help reduce shortness of breath and control the cough. A cool mist vaporizer may also be used to help thin bronchial secretions and make it easier to clear the chest.  HOME CARE INSTRUCTIONS  Get plenty of rest.   Drink enough fluids to keep your urine clear or pale yellow (unless you have a medical condition that requires fluid restriction). Increasing fluids may help thin your respiratory  secretions (sputum) and reduce chest congestion, and it will prevent dehydration.   Take medicines only as directed by your health care provider.  If you were prescribed an antibiotic medicine, finish it all even if you start to feel better.  Avoid smoking and secondhand smoke. Exposure to cigarette smoke or irritating chemicals will make bronchitis worse. If you are a smoker, consider using nicotine gum or skin patches to help control withdrawal symptoms. Quitting smoking will help your lungs heal faster.   Reduce the chances of another bout of acute bronchitis by washing your hands frequently, avoiding people with cold symptoms, and trying not to touch your hands to your mouth, nose, or eyes.   Keep all follow-up visits as directed by your health care provider.  SEEK MEDICAL CARE IF: Your symptoms do not improve after 1 week of treatment.  SEEK IMMEDIATE MEDICAL CARE IF:  You develop an increased fever or chills.   You have chest pain.   You have severe shortness of breath.  You have bloody sputum.   You develop dehydration.  You faint or repeatedly feel like you are going to pass out.  You develop repeated vomiting.  You develop a severe headache. MAKE SURE YOU:     Understand these instructions.  Will watch your condition.  Will get help right away if you are not doing well or get worse. Document Released: 01/16/2005 Document Revised: 04/25/2014 Document Reviewed: 06/01/2013 ExitCare Patient Information 2015 [employer]. This information is not intended to replace advice given to you by your health care provider. Make sure you discuss any questions you have with your health care provider.  

## 2015-03-24 NOTE — Progress Notes (Signed)
PHARMACIST - PHYSICIAN COMMUNICATION DR:   Charlies Silvers CONCERNING: Antibiotic IV to Oral Route Change Policy  RECOMMENDATION: This patient is receiving azithromycin by the intravenous route.  Based on criteria approved by the Pharmacy and Therapeutics Committee, the antibiotic(s) is/are being converted to the equivalent oral dose form(s).   DESCRIPTION: These criteria include:  Patient being treated for a respiratory tract infection, urinary tract infection, cellulitis or clostridium difficile associated diarrhea if on metronidazole  The patient is not neutropenic and does not exhibit a GI malabsorption state  The patient is eating (either orally or via tube) and/or has been taking other orally administered medications for a least 24 hours  The patient is improving clinically and has a Tmax < 100.5  If you have questions about this conversion, please contact the Pharmacy Department  []   [phone] )  [facility] []   [phone] )  [facility]  []   [phone] )  [facility] [x]   [phone] )  [place], PharmD, BCPS

## 2015-07-17 ENCOUNTER — Ambulatory Visit: Payer: Medicaid Other

## 2015-07-18 ENCOUNTER — Emergency Department (HOSPITAL_COMMUNITY): Payer: Medicaid Other

## 2015-07-18 ENCOUNTER — Encounter (HOSPITAL_COMMUNITY): Payer: Self-pay | Admitting: Emergency Medicine

## 2015-07-18 ENCOUNTER — Emergency Department (HOSPITAL_COMMUNITY)
Admission: EM | Admit: 2015-07-18 | Discharge: 2015-07-19 | Disposition: A | Discharge status: Home / Self Care | Payer: Medicaid Other | Attending: Emergency Medicine | Admitting: Emergency Medicine

## 2015-07-18 DIAGNOSIS — Z8619 Personal history of other infectious and parasitic diseases: Secondary | ICD-10-CM | POA: Diagnosis not present

## 2015-07-18 DIAGNOSIS — Z720 Tobacco use: Secondary | ICD-10-CM | POA: Diagnosis not present

## 2015-07-18 DIAGNOSIS — G8929 Other chronic pain: Secondary | ICD-10-CM | POA: Diagnosis not present

## 2015-07-18 DIAGNOSIS — Z7982 Long term (current) use of aspirin: Secondary | ICD-10-CM | POA: Insufficient documentation

## 2015-07-18 DIAGNOSIS — R0789 Other chest pain: Secondary | ICD-10-CM | POA: Diagnosis not present

## 2015-07-18 DIAGNOSIS — Z8719 Personal history of other diseases of the digestive system: Secondary | ICD-10-CM | POA: Diagnosis not present

## 2015-07-18 DIAGNOSIS — M1990 Unspecified osteoarthritis, unspecified site: Secondary | ICD-10-CM | POA: Insufficient documentation

## 2015-07-18 DIAGNOSIS — Z79899 Other long term (current) drug therapy: Secondary | ICD-10-CM | POA: Insufficient documentation

## 2015-07-18 DIAGNOSIS — J45909 Unspecified asthma, uncomplicated: Secondary | ICD-10-CM | POA: Diagnosis not present

## 2015-07-18 DIAGNOSIS — Z91040 Latex allergy status: Secondary | ICD-10-CM | POA: Diagnosis not present

## 2015-07-18 DIAGNOSIS — R079 Chest pain, unspecified: Secondary | ICD-10-CM | POA: Diagnosis present

## 2015-07-18 LAB — BASIC METABOLIC PANEL
ANION GAP: 5 (ref 5–15)
BUN: 7 mg/dL (ref 6–20)
CO2: 24 mmol/L (ref 22–32)
CREATININE: 0.66 mg/dL (ref 0.44–1.00)
Calcium: 8.5 mg/dL — ABNORMAL LOW (ref 8.9–10.3)
Chloride: 110 mmol/L (ref 101–111)
GFR calc Af Amer: >60 mL/min (ref >60)
GFR calc non Af Amer: >60 mL/min (ref >60)
Glucose, Bld: 118 mg/dL — ABNORMAL HIGH (ref 65–99)
Potassium: 3.1 mmol/L — ABNORMAL LOW (ref 3.5–5.1)
SODIUM: 139 mmol/L (ref 135–145)

## 2015-07-18 LAB — CBC
HEMATOCRIT: 35.3 % — AB (ref 36.0–46.0)
HEMOGLOBIN: 11.9 g/dL — AB (ref 12.0–15.0)
MCH: 30 pg (ref 26.0–34.0)
MCHC: 33.7 g/dL (ref 30.0–36.0)
MCV: 88.9 fL (ref 78.0–100.0)
Platelets: 227 10*3/uL (ref 150–400)
RBC: 3.97 MIL/uL (ref 3.87–5.11)
RDW: 13.2 % (ref 11.5–15.5)
WBC: 9 10*3/uL (ref 4.0–10.5)

## 2015-07-18 LAB — I-STAT TROPONIN, ED: Troponin i, poc: 0 ng/mL (ref 0.00–0.08)

## 2015-07-18 NOTE — ED Notes (Signed)
Pt states she feels like her heart is trying to stop and it makes her have to gasp for air  Pt states she is feeling short of breath  Pt states this started on Saturday and has progressively been getting worse  Pt states today about an hour ago she started having chest pain that radiates up into her neck and down her left arm  Pt states she took two aspirin about 10 minutes prior to coming to the hospital

## 2015-07-19 NOTE — ED Provider Notes (Signed)
CSN: [id number]     Arrival date & time 07/18/15  2126 History   First MD Initiated Contact with Patient 07/19/15 0142     Chief Complaint  Patient presents with  . Chest Pain     (Consider location/radiation/quality/duration/timing/severity/associated sxs/prior Treatment) HPI Comments: Patient is a 34 year old female past medical history for chronic back pain, tobacco abuse, asthma, IBS presenting to the ED for evaluation of intermittent episodes of left-sided chest pain. Patient describes her pain as "sharp, dull, sore, aching, pressure." She states the pain lasts no more than 15 minutes at a time and has been on and off since Saturday. The last episode of pain was just upon arrival to the ED, patient is pain free currently. Patient states she feels like she is having a hard time getting a full breath of air at the onset of each episode of pain. Denies any nausea, vomiting, syncope, fever, cough, swelling or tenderness. Patient reports that her recently just passed away prior to the onset of symptoms. PERC negative. No early familial cardiac history.   Patient is a 34 y.o. female presenting with chest pain.  Chest Pain   Past Medical History  Diagnosis Date  . Irritable bowel   . Asthma     meds as needed  . Ectopic pregnancy     requiring surgery  . Gonorrhea   . Preterm labor   . Arthritis   . Chronic back pain    Past Surgical History  Procedure Laterality Date  . Wisdom tooth extraction    . Tubal ligation  2005  . Laparoscopy  05/30/2012    Procedure: LAPAROSCOPY OPERATIVE;  Surgeon: Osborne Oman, MD;  Location: [facility];  Service: Gynecology;  Laterality: N/A;  Operative laparoscopy right salpingectomy and removal of ectopic pregnancy  . Salpingectomy    . Laparoscopy for ectopic pregnancy     Family History  Problem Relation Age of Onset  . Asthma Brother    History  Substance Use Topics  . Smoking status: Current Every Day Smoker -- 0.25 packs/day    Types:  Cigarettes  . Smokeless tobacco: Never Used  . Alcohol Use: Yes     Comment: occasional   OB History    Gravida Para Term Preterm AB TAB SAB Ectopic Multiple Living   6 3 2 1 2  1 1  3      Review of Systems  Cardiovascular: Positive for chest pain.  All other systems reviewed and are negative.     Allergies  Hydrocodeine; Latex; Raspberry; and Vioxx  Home Medications   Prior to Admission medications   Medication Sig Start Date End Date Taking? Authorizing Provider  albuterol (PROVENTIL HFA;VENTOLIN HFA) 108 (90 BASE) MCG/ACT inhaler Inhale 1-2 puffs into the lungs every 6 (six) hours as needed for wheezing or shortness of breath. 03/21/15  Yes Tiffany Carlota Raspberry, PA-C  albuterol (PROVENTIL) (2.5 MG/3ML) 0.083% nebulizer solution Take 3 mLs (2.5 mg total) by nebulization every 6 (six) hours as needed for wheezing or shortness of breath. 03/24/15  Yes Robbie Lis, MD  aspirin 325 MG tablet Take 650 mg by mouth every 4 (four) hours as needed for mild pain.   Yes Historical Provider, MD  ibuprofen (ADVIL,MOTRIN) 800 MG tablet Take 800 mg by mouth every 8 (eight) hours as needed for moderate pain (pain).    Yes Historical Provider, MD  propranolol (INDERAL) 60 MG tablet Take 1 tablet (60 mg total) by mouth 2 (two) times daily. Patient taking differently:  Take 60 mg by mouth daily.  05/09/14  Yes Marcial Pacas, MD  azithromycin (ZITHROMAX) 500 MG tablet Take 1 tablet (500 mg total) by mouth at bedtime. Patient not taking: Reported on 07/18/2015 03/24/15   Robbie Lis, MD  predniSONE (DELTASONE) 5 MG tablet Take 10 tablets (50 mg total) by mouth daily with breakfast. Patient not taking: Reported on 07/18/2015 03/24/15   Robbie Lis, MD   BP 127/74 mmHg  Pulse 50  Temp(Src) 97.8 F (36.6 C) (Oral)  Resp 18  SpO2 100%  LMP 06/29/2015 (Exact Date) Physical Exam  Constitutional: She is oriented to person, place, and time. She appears well-developed and well-nourished.  HENT:  Head:  Normocephalic and atraumatic.  Eyes: Conjunctivae are normal.  Neck: Neck supple.  Cardiovascular: Normal rate, regular rhythm and normal heart sounds.   Pulmonary/Chest: Effort normal and breath sounds normal. No respiratory distress. She exhibits tenderness.  Abdominal: Soft. There is no tenderness.  Musculoskeletal: Normal range of motion. She exhibits no edema.  Neurological: She is alert and oriented to person, place, and time.  Skin: Skin is warm and dry.  Nursing note and vitals reviewed.   ED Course  Procedures (including critical care time) Medications - No data to display  Labs Review Labs Reviewed  BASIC METABOLIC PANEL - Abnormal; Notable for the following:    Potassium 3.1 (*)    Glucose, Bld 118 (*)    Calcium 8.5 (*)    All other components within normal limits  CBC - Abnormal; Notable for the following:    Hemoglobin 11.9 (*)    HCT 35.3 (*)    All other components within normal limits  Randolm Idol, ED    Imaging Review Dg Chest 2 View  07/18/2015   CLINICAL DATA:  Initial evaluation for acute chest pain.  EXAM: CHEST  2 VIEW  COMPARISON:  Prior study from 03/22/2015  FINDINGS: The cardiac and mediastinal silhouettes are stable in size and contour, and remain within normal limits.  The lungs are normally inflated. Mild elevation left hemidiaphragm, similar to previous. No airspace consolidation, pleural effusion, or pulmonary edema is identified. There is no pneumothorax.  No acute osseous abnormality identified.  IMPRESSION: No active cardiopulmonary disease.   Electronically Signed   By: Jeannine Boga M.D.   On: 07/18/2015 23:25     EKG Interpretation   Date/Time:  Tuesday July 18 2015 21:34:09 EDT Ventricular Rate:  85 PR Interval:  115 QRS Duration: 77 QT Interval:  357 QTC Calculation: 424 R Axis:   75 Text Interpretation:  Sinus rhythm Borderline short PR interval no acute  ischemia Confirmed by Thomasene Lot, COURTNEY ([id number]) on 07/19/2015  12:00:01 AM      MDM   Final diagnoses:  Chest pain, atypical    Filed Vitals:   07/19/15 0245  BP: 127/74  Pulse: 50  Temp:   Resp: 18   Afebrile, NAD, non-toxic appearing, AAOx4.   Patient is to be discharged with recommendation to follow up with PCP in regards to today's hospital visit. Chest pain is not likely of cardiac or pulmonary etiology d/t presentation, perc negative, VSS, no tracheal deviation, no JVD or new murmur, RRR, breath sounds equal bilaterally, EKG without acute abnormalities, negative troponin, and negative CXR. Pt has been advised to return to the ED is CP becomes exertional, associated with diaphoresis or nausea, radiates to left jaw/arm, worsens or becomes concerning in any way. Pt appears reliable for follow up and is agreeable to discharge.  Lavere Shinsky, PA-C 07/19/15 [place], MD 07/20/15 [phone]

## 2015-07-19 NOTE — Discharge Instructions (Signed)
Please follow up with your primary care physician in 1-2 days. If you do not have one please call the [place] number listed above. Please read all discharge instructions and return precautions.    Chest Pain (Nonspecific) It is often hard to give a specific diagnosis for the cause of chest pain. There is always a chance that your pain could be related to something serious, such as a heart attack or a blood clot in the lungs. You need to follow up with your health care provider for further evaluation. CAUSES   Heartburn.  Pneumonia or bronchitis.  Anxiety or stress.  Inflammation around your heart (pericarditis) or lung (pleuritis or pleurisy).  A blood clot in the lung.  A collapsed lung (pneumothorax). It can develop suddenly on its own (spontaneous pneumothorax) or from trauma to the chest.  Shingles infection (herpes zoster virus). The chest wall is composed of bones, muscles, and cartilage. Any of these can be the source of the pain.  The bones can be bruised by injury.  The muscles or cartilage can be strained by coughing or overwork.  The cartilage can be affected by inflammation and become sore (costochondritis). DIAGNOSIS  Lab tests or other studies may be needed to find the cause of your pain. Your health care provider may have you take a test called an ambulatory electrocardiogram (ECG). An ECG records your heartbeat patterns over a 24-hour period. You may also have other tests, such as:  Transthoracic echocardiogram (TTE). During echocardiography, sound waves are used to evaluate how blood flows through your heart.  Transesophageal echocardiogram (TEE).  Cardiac monitoring. This allows your health care provider to monitor your heart rate and rhythm in real time.  Holter monitor. This is a portable device that records your heartbeat and can help diagnose heart arrhythmias. It allows your health care provider to track your heart activity for  several days, if needed.  Stress tests by exercise or by giving medicine that makes the heart beat faster. TREATMENT   Treatment depends on what may be causing your chest pain. Treatment may include:  Acid blockers for heartburn.  Anti-inflammatory medicine.  Pain medicine for inflammatory conditions.  Antibiotics if an infection is present.  You may be advised to change lifestyle habits. This includes stopping smoking and avoiding alcohol, caffeine, and chocolate.  You may be advised to keep your head raised (elevated) when sleeping. This reduces the chance of acid going backward from your stomach into your esophagus. Most of the time, nonspecific chest pain will improve within 2-3 days with rest and mild pain medicine.  HOME CARE INSTRUCTIONS   If antibiotics were prescribed, take them as directed. Finish them even if you start to feel better.  For the next few days, avoid physical activities that bring on chest pain. Continue physical activities as directed.  Do not use any tobacco products, including cigarettes, chewing tobacco, or electronic cigarettes.  Avoid drinking alcohol.  Only take medicine as directed by your health care provider.  Follow your health care provider's suggestions for further testing if your chest pain does not go away.  Keep any follow-up appointments you made. If you do not go to an appointment, you could develop lasting (chronic) problems with pain. If there is any problem keeping an appointment, call to reschedule. SEEK MEDICAL CARE IF:   Your chest pain does not go away, even after treatment.  You have a rash with blisters on your chest.  You have a fever.  SEEK IMMEDIATE MEDICAL CARE IF:   You have increased chest pain or pain that spreads to your arm, neck, jaw, back, or abdomen.  You have shortness of breath.  You have an increasing cough, or you cough up blood.  You have severe back or abdominal pain.  You feel nauseous or  vomit.  You have severe weakness.  You faint.  You have chills. This is an emergency. Do not wait to see if the pain will go away. Get medical help at once. Call your local emergency services (911 in [place] Do not drive yourself to the hospital. MAKE SURE YOU:   Understand these instructions.  Will watch your condition.  Will get help right away if you are not doing well or get worse. Document Released: 09/18/2005 Document Revised: 12/14/2013 Document Reviewed: 07/14/2008 [facility] Patient Information 2015 [place], [place]. This information is not intended to replace advice given to you by your health care provider. Make sure you discuss any questions you have with your health care provider.

## 2015-08-06 ENCOUNTER — Emergency Department (HOSPITAL_COMMUNITY)
Admission: EM | Admit: 2015-08-06 | Discharge: 2015-08-07 | Disposition: A | Discharge status: Home / Self Care | Payer: Medicaid Other | Attending: Emergency Medicine | Admitting: Emergency Medicine

## 2015-08-06 ENCOUNTER — Encounter (HOSPITAL_COMMUNITY): Payer: Self-pay | Admitting: Emergency Medicine

## 2015-08-06 DIAGNOSIS — M1990 Unspecified osteoarthritis, unspecified site: Secondary | ICD-10-CM | POA: Diagnosis not present

## 2015-08-06 DIAGNOSIS — Z8619 Personal history of other infectious and parasitic diseases: Secondary | ICD-10-CM | POA: Diagnosis not present

## 2015-08-06 DIAGNOSIS — Z79899 Other long term (current) drug therapy: Secondary | ICD-10-CM | POA: Diagnosis not present

## 2015-08-06 DIAGNOSIS — K589 Irritable bowel syndrome without diarrhea: Secondary | ICD-10-CM | POA: Diagnosis not present

## 2015-08-06 DIAGNOSIS — G43809 Other migraine, not intractable, without status migrainosus: Secondary | ICD-10-CM | POA: Insufficient documentation

## 2015-08-06 DIAGNOSIS — J45909 Unspecified asthma, uncomplicated: Secondary | ICD-10-CM | POA: Insufficient documentation

## 2015-08-06 DIAGNOSIS — G8929 Other chronic pain: Secondary | ICD-10-CM | POA: Diagnosis not present

## 2015-08-06 DIAGNOSIS — Z720 Tobacco use: Secondary | ICD-10-CM | POA: Insufficient documentation

## 2015-08-06 DIAGNOSIS — Z91040 Latex allergy status: Secondary | ICD-10-CM | POA: Diagnosis not present

## 2015-08-06 DIAGNOSIS — R51 Headache: Secondary | ICD-10-CM | POA: Diagnosis present

## 2015-08-06 HISTORY — DX: Migraine, unspecified, not intractable, without status migrainosus: G43.909

## 2015-08-06 LAB — CBC WITH DIFFERENTIAL/PLATELET
Basophils Absolute: 0 10*3/uL (ref 0.0–0.1)
Basophils Relative: 0 % (ref 0–1)
Eosinophils Absolute: 0.2 10*3/uL (ref 0.0–0.7)
Eosinophils Relative: 3 % (ref 0–5)
HEMATOCRIT: 37.9 % (ref 36.0–46.0)
HEMOGLOBIN: 12.6 g/dL (ref 12.0–15.0)
LYMPHS PCT: 40 % (ref 12–46)
Lymphs Abs: 3.7 10*3/uL (ref 0.7–4.0)
MCH: 29.4 pg (ref 26.0–34.0)
MCHC: 33.2 g/dL (ref 30.0–36.0)
MCV: 88.3 fL (ref 78.0–100.0)
MONOS PCT: 8 % (ref 3–12)
Monocytes Absolute: 0.7 10*3/uL (ref 0.1–1.0)
NEUTROS ABS: 4.5 10*3/uL (ref 1.7–7.7)
NEUTROS PCT: 49 % (ref 43–77)
Platelets: 248 10*3/uL (ref 150–400)
RBC: 4.29 MIL/uL (ref 3.87–5.11)
RDW: 13.4 % (ref 11.5–15.5)
WBC: 9.2 10*3/uL (ref 4.0–10.5)

## 2015-08-06 LAB — COMPREHENSIVE METABOLIC PANEL
ALBUMIN: 3.6 g/dL (ref 3.5–5.0)
ALT: 19 U/L (ref 14–54)
AST: 20 U/L (ref 15–41)
Alkaline Phosphatase: 133 U/L — ABNORMAL HIGH (ref 38–126)
Anion gap: 8 (ref 5–15)
BILIRUBIN TOTAL: 0.3 mg/dL (ref 0.3–1.2)
BUN: 8 mg/dL (ref 6–20)
CO2: 24 mmol/L (ref 22–32)
CREATININE: 0.82 mg/dL (ref 0.44–1.00)
Calcium: 9.2 mg/dL (ref 8.9–10.3)
Chloride: 106 mmol/L (ref 101–111)
Glucose, Bld: 123 mg/dL — ABNORMAL HIGH (ref 65–99)
Potassium: 3.6 mmol/L (ref 3.5–5.1)
SODIUM: 138 mmol/L (ref 135–145)
Total Protein: 6.6 g/dL (ref 6.5–8.1)

## 2015-08-06 MED ORDER — METOCLOPRAMIDE HCL 5 MG/ML IJ SOLN
10.0000 mg | Freq: Once | INTRAMUSCULAR | Status: AC
  Administered 2015-08-06: 10 mg via INTRAVENOUS
  Filled 2015-08-06: qty 2

## 2015-08-06 MED ORDER — SODIUM CHLORIDE 0.9 % IV BOLUS (SEPSIS)
1000.0000 mL | Freq: Once | INTRAVENOUS | Status: AC
  Administered 2015-08-06: 1000 mL via INTRAVENOUS

## 2015-08-06 MED ORDER — KETOROLAC TROMETHAMINE 30 MG/ML IJ SOLN
30.0000 mg | Freq: Once | INTRAMUSCULAR | Status: AC
  Administered 2015-08-06: 30 mg via INTRAVENOUS
  Filled 2015-08-06: qty 1

## 2015-08-06 MED ORDER — DIPHENHYDRAMINE HCL 50 MG/ML IJ SOLN
25.0000 mg | Freq: Once | INTRAMUSCULAR | Status: AC
  Administered 2015-08-06: 25 mg via INTRAVENOUS
  Filled 2015-08-06: qty 1

## 2015-08-06 NOTE — ED Notes (Signed)
Pt. reports intermittent migraine headache with emesis , photophobia and mild blurred vision onset last week . Denies fever or chills.

## 2015-08-06 NOTE — ED Provider Notes (Signed)
CSN: [id number]     Arrival date & time 08/06/15  2102 History   First MD Initiated Contact with Patient 08/06/15 2252     Chief Complaint  Patient presents with  . Migraine     (Consider location/radiation/quality/duration/timing/severity/associated sxs/prior Treatment) HPI Comments: Patient is a 34 year old female with a past medical history of migraines who presents with a headache for 1.5 weeks. Patient reports a gradual onset and progressive worsening of the headache. The pain is sharp, constant and is located in left head without radiation. Patient has tried nortriptyline and ibuprofen for symptoms without relief. No alleviating/aggravating factors. Patient reports associated nausea and photophobia and transient left eye visual changes. These associated symptoms as baseline for her migraines. Patient denies fever, vomiting, diarrhea, numbness/tingling, weakness, congestion, chest pain, SOB, abdominal pain.      Past Medical History  Diagnosis Date  . Irritable bowel   . Asthma     meds as needed  . Ectopic pregnancy     requiring surgery  . Gonorrhea   . Preterm labor   . Arthritis   . Chronic back pain   . Migraine headache    Past Surgical History  Procedure Laterality Date  . Wisdom tooth extraction    . Tubal ligation  2005  . Laparoscopy  05/30/2012    Procedure: LAPAROSCOPY OPERATIVE;  Surgeon: Osborne Oman, MD;  Location: [facility];  Service: Gynecology;  Laterality: N/A;  Operative laparoscopy right salpingectomy and removal of ectopic pregnancy  . Salpingectomy    . Laparoscopy for ectopic pregnancy     Family History  Problem Relation Age of Onset  . Asthma Brother    Social History  Substance Use Topics  . Smoking status: Current Every Day Smoker -- 0.25 packs/day    Types: Cigarettes  . Smokeless tobacco: Never Used  . Alcohol Use: Yes     Comment: occasional   OB History    Gravida Para Term Preterm AB TAB SAB Ectopic Multiple Living   6 3 2 1 2  1  1  3      Review of Systems  Constitutional: Negative for fever, chills and fatigue.  HENT: Negative for trouble swallowing.   Eyes: Negative for visual disturbance.  Respiratory: Negative for shortness of breath.   Cardiovascular: Negative for chest pain and palpitations.  Gastrointestinal: Negative for nausea, vomiting, abdominal pain and diarrhea.  Genitourinary: Negative for dysuria and difficulty urinating.  Musculoskeletal: Negative for arthralgias and neck pain.  Skin: Negative for color change.  Neurological: Positive for headaches. Negative for dizziness and weakness.  Psychiatric/Behavioral: Negative for dysphoric mood.      Allergies  Hydrocodeine; Latex; Raspberry; and Vioxx  Home Medications   Prior to Admission medications   Medication Sig Start Date End Date Taking? Authorizing Provider  albuterol (PROVENTIL HFA;VENTOLIN HFA) 108 (90 BASE) MCG/ACT inhaler Inhale 1-2 puffs into the lungs every 6 (six) hours as needed for wheezing or shortness of breath. 03/21/15  Yes Tiffany Carlota Raspberry, PA-C  albuterol (PROVENTIL) (2.5 MG/3ML) 0.083% nebulizer solution Take 3 mLs (2.5 mg total) by nebulization every 6 (six) hours as needed for wheezing or shortness of breath. 03/24/15  Yes Robbie Lis, MD  gabapentin (NEURONTIN) 300 MG capsule Take 300 mg by mouth See admin instructions. Take 1 capsule (300 mg) by mouth on the nights that you are home too late to take nortriptyline (take with nortriptyline on Friday and Saturday nights)   Yes Historical Provider, MD  ibuprofen (ADVIL,MOTRIN)  800 MG tablet Take 800 mg by mouth at bedtime as needed for headache.    Yes Historical Provider, MD  Multiple Vitamin (MULTIVITAMIN WITH MINERALS) TABS tablet Take 1 tablet by mouth daily.   Yes Historical Provider, MD  nortriptyline (PAMELOR) 10 MG capsule Take 20 mg by mouth See admin instructions. Take 2 capsules (20 mg) by mouth every night that you are home before 8pm   Yes Historical Provider, MD   [place] 1 application topically every 8 (eight) hours as needed (knee and back pain). Heat wrap from the dollar store   Yes Historical Provider, MD  propranolol (INDERAL) 60 MG tablet Take 1 tablet (60 mg total) by mouth 2 (two) times daily. Patient taking differently: Take 60 mg by mouth at bedtime.  05/09/14  Yes Marcial Pacas, MD   LMP 07/30/2015 Physical Exam  Constitutional: She is oriented to person, place, and time. She appears well-developed and well-nourished. No distress.  HENT:  Head: Normocephalic and atraumatic.  Eyes: Conjunctivae and EOM are normal. Pupils are equal, round, and reactive to light.  Neck: Normal range of motion.  Cardiovascular: Normal rate and regular rhythm.  Exam reveals no gallop and no friction rub.   No murmur heard. Pulmonary/Chest: Effort normal and breath sounds normal. She has no wheezes. She has no rales. She exhibits no tenderness.  Abdominal: Soft. She exhibits no distension. There is no tenderness. There is no rebound.  Musculoskeletal: Normal range of motion.  Neurological: She is alert and oriented to person, place, and time. No cranial nerve deficit. Coordination normal.  Speech is goal-oriented. Moves limbs without ataxia.   Skin: Skin is warm and dry.  Psychiatric: She has a normal mood and affect. Her behavior is normal.  Nursing note and vitals reviewed.   ED Course  Procedures (including critical care time) Labs Review Labs Reviewed  COMPREHENSIVE METABOLIC PANEL - Abnormal; Notable for the following:    Glucose, Bld 123 (*)    Alkaline Phosphatase 133 (*)    All other components within normal limits  CBC WITH DIFFERENTIAL/PLATELET    Imaging Review Ct Head Wo Contrast  08/07/2015   CLINICAL DATA:  Left occipital migraine with photophobia for 1 week.  EXAM: CT HEAD WITHOUT CONTRAST  TECHNIQUE: Contiguous axial images were obtained from the base of the skull through the vertex without intravenous contrast.   COMPARISON:  Brain MRI 03/20/2014. 2015 head CT is currently not available.  FINDINGS: Skull and Sinuses:Negative for fracture or destructive process.  Mild lobulated mucosal thickening in the inferior nasal cavities. The mastoids, middle ears, and imaged paranasal sinuses are clear.  Orbits: No acute abnormality.  Brain: Unremarkable. No acute or remote infarction, hemorrhage, hydrocephalus, or mass lesion/mass effect.  IMPRESSION: Negative head CT.   Electronically Signed   By: Monte Fantasia M.D.   On: 08/07/2015 01:18   I, Alvina Chou, personally reviewed and evaluated these images and lab results as part of my medical decision-making.   EKG Interpretation None      MDM   Final diagnoses:  Other type of migraine without status migrainosus    10:55 PM Patient will have migraine cocktail. No neuro deficits or meningeal signs.   2:11 AM Patient feeling better after migraine cocktail. CT unremarkable for acute changes. Patient will be discharged with recommended PCP follow up.    Alvina Chou, PA-C 08/07/15 0211  Carmin Muskrat, MD 08/10/15 [phone]

## 2015-08-07 ENCOUNTER — Emergency Department (HOSPITAL_COMMUNITY): Payer: Medicaid Other

## 2015-08-07 NOTE — Discharge Instructions (Signed)
Follow up with your doctor for further evaluation of your headache. Refer to attached documents for more information.

## 2015-08-07 NOTE — ED Notes (Signed)
Pt. Is stable ambulatory, and states understanding of d/c instructions.

## 2015-09-05 ENCOUNTER — Emergency Department (HOSPITAL_COMMUNITY)
Admission: EM | Admit: 2015-09-05 | Discharge: 2015-09-05 | Disposition: A | Discharge status: Home / Self Care | Payer: Medicaid Other | Source: Home / Self Care | Attending: Family Medicine | Admitting: Family Medicine

## 2015-09-05 ENCOUNTER — Encounter (HOSPITAL_COMMUNITY): Payer: Self-pay | Admitting: Emergency Medicine

## 2015-09-05 DIAGNOSIS — J0100 Acute maxillary sinusitis, unspecified: Secondary | ICD-10-CM

## 2015-09-05 DIAGNOSIS — J40 Bronchitis, not specified as acute or chronic: Secondary | ICD-10-CM

## 2015-09-05 MED ORDER — AMOXICILLIN 875 MG PO TABS: 875.0000 mg | ORAL_TABLET | Freq: Two times a day (BID) | ORAL | Status: DC

## 2015-09-05 NOTE — ED Notes (Signed)
C/o sinus States she has body ache, facial pain, coughing with yellow brown mucous Sleep was used as tx

## 2015-09-05 NOTE — ED Provider Notes (Signed)
CSN: [id number]     Arrival date & time 09/05/15  1920 History   First MD Initiated Contact with Patient 09/05/15 2025     Chief Complaint  Patient presents with  . Sinusitis   (Consider location/radiation/quality/duration/timing/severity/associated sxs/prior Treatment) Patient is a 34 y.o. female presenting with sinusitis. The history is provided by the patient.  Sinusitis Pain details:    Location:  Maxillary   Quality:  Pressure   Severity:  Moderate   Timing:  Constant Progression:  Worsening Relieved by:  Nothing Ineffective treatments:  Acetaminophen Associated symptoms: congestion, cough, fatigue and fever     Past Medical History  Diagnosis Date  . Irritable bowel   . Asthma     meds as needed  . Ectopic pregnancy     requiring surgery  . Gonorrhea   . Preterm labor   . Arthritis   . Chronic back pain   . Migraine headache    Past Surgical History  Procedure Laterality Date  . Wisdom tooth extraction    . Tubal ligation  2005  . Laparoscopy  05/30/2012    Procedure: LAPAROSCOPY OPERATIVE;  Surgeon: Osborne Oman, MD;  Location: [facility];  Service: Gynecology;  Laterality: N/A;  Operative laparoscopy right salpingectomy and removal of ectopic pregnancy  . Salpingectomy    . Laparoscopy for ectopic pregnancy     Family History  Problem Relation Age of Onset  . Asthma Brother    Social History  Substance Use Topics  . Smoking status: Current Every Day Smoker -- 0.25 packs/day    Types: Cigarettes  . Smokeless tobacco: Never Used  . Alcohol Use: Yes     Comment: occasional   OB History    Gravida Para Term Preterm AB TAB SAB Ectopic Multiple Living   6 3 2 1 2  1 1  3      Review of Systems  Constitutional: Positive for fever and fatigue.  HENT: Positive for congestion.   Eyes: Negative.   Respiratory: Positive for cough.   Cardiovascular: Negative.   Genitourinary: Negative.     Allergies  Hydrocodeine; Latex; Raspberry; and Vioxx  Home  Medications   Prior to Admission medications   Medication Sig Start Date End Date Taking? Authorizing Provider  albuterol (PROVENTIL HFA;VENTOLIN HFA) 108 (90 BASE) MCG/ACT inhaler Inhale 1-2 puffs into the lungs every 6 (six) hours as needed for wheezing or shortness of breath. 03/21/15   Delos Haring, PA-C  albuterol (PROVENTIL) (2.5 MG/3ML) 0.083% nebulizer solution Take 3 mLs (2.5 mg total) by nebulization every 6 (six) hours as needed for wheezing or shortness of breath. 03/24/15   Robbie Lis, MD  gabapentin (NEURONTIN) 300 MG capsule Take 300 mg by mouth See admin instructions. Take 1 capsule (300 mg) by mouth on the nights that you are home too late to take nortriptyline (take with nortriptyline on Friday and Saturday nights)    Historical Provider, MD  ibuprofen (ADVIL,MOTRIN) 800 MG tablet Take 800 mg by mouth at bedtime as needed for headache.     Historical Provider, MD  Multiple Vitamin (MULTIVITAMIN WITH MINERALS) TABS tablet Take 1 tablet by mouth daily.    Historical Provider, MD  nortriptyline (PAMELOR) 10 MG capsule Take 20 mg by mouth See admin instructions. Take 2 capsules (20 mg) by mouth every night that you are home before 8pm    Historical Provider, MD  [place] 1 application topically every 8 (eight) hours as needed (knee and back  pain). Heat wrap from the dollar store    Historical Provider, MD  propranolol (INDERAL) 60 MG tablet Take 1 tablet (60 mg total) by mouth 2 (two) times daily. Patient taking differently: Take 60 mg by mouth at bedtime.  05/09/14   Marcial Pacas, MD   Meds Ordered and Administered this Visit  Medications - No data to display  BP 132/86 mmHg  Pulse 86  Temp(Src) 98.4 F (36.9 C) (Oral)  Resp 16  SpO2 100%  LMP 08/17/2015 No data found.   Physical Exam  Constitutional: She is oriented to person, place, and time. She appears well-developed and well-nourished.  HENT:  Head: Normocephalic and atraumatic.  Right Ear:  External ear normal.  Left Ear: External ear normal.  Mouth/Throat: Oropharynx is clear and moist.  Exquisitely tender left maxilla  Eyes: Conjunctivae are normal. Pupils are equal, round, and reactive to light.  Neck: Normal range of motion. Neck supple.  Cardiovascular: Normal rate, regular rhythm and normal heart sounds.   Pulmonary/Chest: Effort normal and breath sounds normal.  Musculoskeletal: Normal range of motion.  Neurological: She is alert and oriented to person, place, and time.  Nursing note and vitals reviewed.   ED Course  Procedures (including critical care time)    MDM       ICD-9-CM ICD-10-CM   1. Subacute maxillary sinusitis 461.0 J01.00 amoxicillin (AMOXIL) 875 MG tablet  2. Bronchitis 490 J40 amoxicillin (AMOXIL) 875 MG tablet     Signed, Robyn Haber, MD     Robyn Haber, MD 09/05/15 2029

## 2015-09-05 NOTE — Discharge Instructions (Signed)
Acute Bronchitis Bronchitis is inflammation of the airways that extend from the windpipe into the lungs (bronchi). The inflammation often causes mucus to develop. This leads to a cough, which is the most common symptom of bronchitis.  In acute bronchitis, the condition usually develops suddenly and goes away over time, usually in a couple weeks. Smoking, allergies, and asthma can make bronchitis worse. Repeated episodes of bronchitis may cause further lung problems.  CAUSES Acute bronchitis is most often caused by the same virus that causes a cold. The virus can spread from person to person (contagious) through coughing, sneezing, and touching contaminated objects. SIGNS AND SYMPTOMS   Cough.   Fever.   Coughing up mucus.   Body aches.   Chest congestion.   Chills.   Shortness of breath.   Sore throat.  DIAGNOSIS  Acute bronchitis is usually diagnosed through a physical exam. Your health care provider will also ask you questions about your medical history. Tests, such as chest X-rays, are sometimes done to rule out other conditions.  TREATMENT  Acute bronchitis usually goes away in a couple weeks. Oftentimes, no medical treatment is necessary. Medicines are sometimes given for relief of fever or cough. Antibiotic medicines are usually not needed but may be prescribed in certain situations. In some cases, an inhaler may be recommended to help reduce shortness of breath and control the cough. A cool mist vaporizer may also be used to help thin bronchial secretions and make it easier to clear the chest.  HOME CARE INSTRUCTIONS  Get plenty of rest.   Drink enough fluids to keep your urine clear or pale yellow (unless you have a medical condition that requires fluid restriction). Increasing fluids may help thin your respiratory secretions (sputum) and reduce chest congestion, and it will prevent dehydration.   Take medicines only as directed by your health care provider.  If  you were prescribed an antibiotic medicine, finish it all even if you start to feel better.  Avoid smoking and secondhand smoke. Exposure to cigarette smoke or irritating chemicals will make bronchitis worse. If you are a smoker, consider using nicotine gum or skin patches to help control withdrawal symptoms. Quitting smoking will help your lungs heal faster.   Reduce the chances of another bout of acute bronchitis by washing your hands frequently, avoiding people with cold symptoms, and trying not to touch your hands to your mouth, nose, or eyes.   Keep all follow-up visits as directed by your health care provider.  SEEK MEDICAL CARE IF: Your symptoms do not improve after 1 week of treatment.  SEEK IMMEDIATE MEDICAL CARE IF:  You develop an increased fever or chills.   You have chest pain.   You have severe shortness of breath.  You have bloody sputum.   You develop dehydration.  You faint or repeatedly feel like you are going to pass out.  You develop repeated vomiting.  You develop a severe headache. MAKE SURE YOU:   Understand these instructions.  Will watch your condition.  Will get help right away if you are not doing well or get worse. Document Released: 01/16/2005 Document Revised: 04/25/2014 Document Reviewed: 06/01/2013 ExitCare Patient Information 2015 [employer]. This information is not intended to replace advice given to you by your health care provider. Make sure you discuss any questions you have with your health care provider. Sinusitis Sinusitis is redness, soreness, and inflammation of the paranasal sinuses. Paranasal sinuses are air pockets within the bones of your face (beneath the   eyes, the middle of the forehead, or above the eyes). In healthy paranasal sinuses, mucus is able to drain out, and air is able to circulate through them by way of your nose. However, when your paranasal sinuses are inflamed, mucus and air can become trapped. This can  allow bacteria and other germs to grow and cause infection. Sinusitis can develop quickly and last only a short time (acute) or continue over a long period (chronic). Sinusitis that lasts for more than 12 weeks is considered chronic.  CAUSES  Causes of sinusitis include:  Allergies.  Structural abnormalities, such as displacement of the cartilage that separates your nostrils (deviated septum), which can decrease the air flow through your nose and sinuses and affect sinus drainage.  Functional abnormalities, such as when the small hairs (cilia) that line your sinuses and help remove mucus do not work properly or are not present. SIGNS AND SYMPTOMS  Symptoms of acute and chronic sinusitis are the same. The primary symptoms are pain and pressure around the affected sinuses. Other symptoms include:  Upper toothache.  Earache.  Headache.  Bad breath.  Decreased sense of smell and taste.  A cough, which worsens when you are lying flat.  Fatigue.  Fever.  Thick drainage from your nose, which often is green and may contain pus (purulent).  Swelling and warmth over the affected sinuses. DIAGNOSIS  Your health care provider will perform a physical exam. During the exam, your health care provider may:  Look in your nose for signs of abnormal growths in your nostrils (nasal polyps).  Tap over the affected sinus to check for signs of infection.  View the inside of your sinuses (endoscopy) using an imaging device that has a light attached (endoscope). If your health care provider suspects that you have chronic sinusitis, one or more of the following tests may be recommended:  Allergy tests.  Nasal culture. A sample of mucus is taken from your nose, sent to a lab, and screened for bacteria.  Nasal cytology. A sample of mucus is taken from your nose and examined by your health care provider to determine if your sinusitis is related to an allergy. TREATMENT  Most cases of acute  sinusitis are related to a viral infection and will resolve on their own within 10 days. Sometimes medicines are prescribed to help relieve symptoms (pain medicine, decongestants, nasal steroid sprays, or saline sprays).  However, for sinusitis related to a bacterial infection, your health care provider will prescribe antibiotic medicines. These are medicines that will help kill the bacteria causing the infection.  Rarely, sinusitis is caused by a fungal infection. In theses cases, your health care provider will prescribe antifungal medicine. For some cases of chronic sinusitis, surgery is needed. Generally, these are cases in which sinusitis recurs more than 3 times per year, despite other treatments. HOME CARE INSTRUCTIONS   Drink plenty of water. Water helps thin the mucus so your sinuses can drain more easily.  Use a humidifier.  Inhale steam 3 to 4 times a day (for example, sit in the bathroom with the shower running).  Apply a warm, moist washcloth to your face 3 to 4 times a day, or as directed by your health care provider.  Use saline nasal sprays to help moisten and clean your sinuses.  Take medicines only as directed by your health care provider.  If you were prescribed either an antibiotic or antifungal medicine, finish it all even if you start to feel better. SEEK IMMEDIATE MEDICAL   CARE IF:  You have increasing pain or severe headaches.  You have nausea, vomiting, or drowsiness.  You have swelling around your face.  You have vision problems.  You have a stiff neck.  You have difficulty breathing. MAKE SURE YOU:   Understand these instructions.  Will watch your condition.  Will get help right away if you are not doing well or get worse. Document Released: 12/09/2005 Document Revised: 04/25/2014 Document Reviewed: 12/24/2011 Exit[employer] Patient Information 2015 [employer]. This information is not intended to replace advice given to you by your health care provider.  Make sure you discuss any questions you have with your health care provider.  

## 2015-09-05 NOTE — ED Notes (Signed)
Bed: UC03 Expected date:  Expected time:  Means of arrival:  Comments: Hold nix at 8:18pm

## 2015-10-27 ENCOUNTER — Inpatient Hospital Stay (HOSPITAL_COMMUNITY)
Admission: AD | Admit: 2015-10-27 | Discharge: 2015-10-27 | Disposition: A | Discharge status: Home / Self Care | Payer: Medicaid Other | Source: Ambulatory Visit | Attending: Obstetrics & Gynecology | Admitting: Obstetrics & Gynecology

## 2015-10-27 DIAGNOSIS — F17200 Nicotine dependence, unspecified, uncomplicated: Secondary | ICD-10-CM | POA: Insufficient documentation

## 2015-10-27 DIAGNOSIS — Z3202 Encounter for pregnancy test, result negative: Secondary | ICD-10-CM | POA: Diagnosis not present

## 2015-10-27 DIAGNOSIS — N939 Abnormal uterine and vaginal bleeding, unspecified: Secondary | ICD-10-CM | POA: Insufficient documentation

## 2015-10-27 DIAGNOSIS — L293 Anogenital pruritus, unspecified: Secondary | ICD-10-CM | POA: Insufficient documentation

## 2015-10-27 DIAGNOSIS — N926 Irregular menstruation, unspecified: Secondary | ICD-10-CM | POA: Diagnosis not present

## 2015-10-27 LAB — POCT PREGNANCY, URINE: PREG TEST UR: NEGATIVE

## 2015-10-27 LAB — URINALYSIS, ROUTINE W REFLEX MICROSCOPIC
BILIRUBIN URINE: NEGATIVE
GLUCOSE, UA: NEGATIVE mg/dL
Ketones, ur: NEGATIVE mg/dL
Leukocytes, UA: NEGATIVE
Nitrite: NEGATIVE
PH: 7.5 (ref 5.0–8.0)
Protein, ur: NEGATIVE mg/dL
SPECIFIC GRAVITY, URINE: 1.015 (ref 1.005–1.030)
UROBILINOGEN UA: 1 mg/dL (ref 0.0–1.0)

## 2015-10-27 LAB — CBC
HEMATOCRIT: 37.3 % (ref 36.0–46.0)
HEMOGLOBIN: 12.2 g/dL (ref 12.0–15.0)
MCH: 29.1 pg (ref 26.0–34.0)
MCHC: 32.7 g/dL (ref 30.0–36.0)
MCV: 89 fL (ref 78.0–100.0)
Platelets: 280 10*3/uL (ref 150–400)
RBC: 4.19 MIL/uL (ref 3.87–5.11)
RDW: 13.7 % (ref 11.5–15.5)
WBC: 9.4 10*3/uL (ref 4.0–10.5)

## 2015-10-27 LAB — URINE MICROSCOPIC-ADD ON

## 2015-10-27 MED ORDER — IBUPROFEN 800 MG PO TABS: 800.0000 mg | ORAL_TABLET | Freq: Three times a day (TID) | ORAL | Status: DC

## 2015-10-27 MED ORDER — OXYCODONE-ACETAMINOPHEN 5-325 MG PO TABS: 1.0000 | ORAL_TABLET | Freq: Four times a day (QID) | ORAL | Status: DC | PRN

## 2015-10-27 MED ORDER — FLUCONAZOLE 150 MG PO TABS: 150.0000 mg | ORAL_TABLET | Freq: Every day | ORAL | Status: DC

## 2015-10-27 NOTE — Discharge Instructions (Signed)

## 2015-10-27 NOTE — MAU Note (Signed)
Pt has ? Yeast infection - had itching, discharge.  Pt was sleeping today, woke up & was bleeding.  Had very heavy bleeding in toilet.  Hx of ectopic x 2.  Is having severe abd pain like contractions.

## 2015-10-27 NOTE — MAU Provider Note (Signed)
History     CSN: [id number]  Arrival date and time: 10/27/15 1637   First Provider Initiated Contact with Patient 10/27/15 1745      Chief Complaint  Patient presents with  . Abdominal Pain  . Vaginal Bleeding   HPI [name] [name] is a 34 y.o. [id number] who presents to MAU today with concern for vaginal bleeding with possible pregnancy. The patient states a history of irregular periods. She states LMP 10/16/15. She was concerned about a yeast infection and treated it with OTC medications. She denies vaginal discharge, redness or rash, but continues to have itching. She states that vaginal bleeding started at 1600 today. She has used 2 pads since onset. She states moderate associated abdominal pain. She has not taken anything for pain. She also endorses weakness, although this is not a new issue and is unchanged from previously.   OB History    Gravida Para Term Preterm AB TAB SAB Ectopic Multiple Living   6 3 2 1 2  1 1  3       Past Medical History  Diagnosis Date  . Irritable bowel   . Asthma     meds as needed  . Ectopic pregnancy     requiring surgery  . Gonorrhea   . Preterm labor   . Arthritis   . Chronic back pain   . Migraine headache     Past Surgical History  Procedure Laterality Date  . Wisdom tooth extraction    . Tubal ligation  2005  . Laparoscopy  05/30/2012    Procedure: LAPAROSCOPY OPERATIVE;  Surgeon: Osborne Oman, MD;  Location: [facility];  Service: Gynecology;  Laterality: N/A;  Operative laparoscopy right salpingectomy and removal of ectopic pregnancy  . Salpingectomy    . Laparoscopy for ectopic pregnancy      Family History  Problem Relation Age of Onset  . Asthma Brother     Social History  Substance Use Topics  . Smoking status: Current Every Day Smoker -- 0.25 packs/day    Types: Cigarettes  . Smokeless tobacco: Never Used  . Alcohol Use: Yes     Comment: occasional    Allergies:  Allergies  Allergen Reactions  .  Hydrocodeine [Dihydrocodeine] Hives  . Latex Hives  . Raspberry Hives  . Vioxx [Rofecoxib] Hives    No prescriptions prior to admission    Review of Systems  Constitutional: Negative for fever and malaise/fatigue.  Gastrointestinal: Positive for abdominal pain. Negative for nausea, vomiting, diarrhea and constipation.  Genitourinary:       + vaginal bleeding  Neurological: Positive for weakness. Negative for dizziness and loss of consciousness.   Physical Exam   Blood pressure 131/83, pulse 85, temperature 98 F (36.7 C), temperature source Oral, resp. rate 18, last menstrual period 10/17/2015.  Physical Exam  Nursing note and vitals reviewed. Constitutional: She is oriented to person, place, and time. She appears well-developed and well-nourished. No distress.  HENT:  Head: Normocephalic and atraumatic.  Cardiovascular: Normal rate.   Respiratory: Effort normal.  GI: Soft. She exhibits no distension and no mass. There is no tenderness. There is no rebound and no guarding.  Neurological: She is alert and oriented to person, place, and time.  Skin: Skin is warm and dry. No erythema.  Psychiatric: She has a normal mood and affect.   Results for orders placed or performed during the hospital encounter of 10/27/15 (from the past 24 hour(s))  Urinalysis, Routine w reflex microscopic (not  at [facility])     Status: Abnormal   Collection Time: 10/27/15  5:00 PM  Result Value Ref Range   Color, Urine YELLOW YELLOW   APPearance CLEAR CLEAR   Specific Gravity, Urine 1.015 1.005 - 1.030   pH 7.5 5.0 - 8.0   Glucose, UA NEGATIVE NEGATIVE mg/dL   Hgb urine dipstick SMALL (A) NEGATIVE   Bilirubin Urine NEGATIVE NEGATIVE   Ketones, ur NEGATIVE NEGATIVE mg/dL   Protein, ur NEGATIVE NEGATIVE mg/dL   Urobilinogen, UA 1.0 0.0 - 1.0 mg/dL   Nitrite NEGATIVE NEGATIVE   Leukocytes, UA NEGATIVE NEGATIVE  Urine microscopic-add on     Status: Abnormal   Collection Time: 10/27/15  5:00 PM  Result  Value Ref Range   Squamous Epithelial / LPF FEW (A) RARE   WBC, UA 0-2 <3 WBC/hpf   RBC / HPF 0-2 <3 RBC/hpf  Pregnancy, urine POC     Status: None   Collection Time: 10/27/15  5:07 PM  Result Value Ref Range   Preg Test, Ur NEGATIVE NEGATIVE  CBC     Status: None   Collection Time: 10/27/15  5:29 PM  Result Value Ref Range   WBC 9.4 4.0 - 10.5 K/uL   RBC 4.19 3.87 - 5.11 MIL/uL   Hemoglobin 12.2 12.0 - 15.0 g/dL   HCT 37.3 36.0 - 46.0 %   MCV 89.0 78.0 - 100.0 fL   MCH 29.1 26.0 - 34.0 pg   MCHC 32.7 30.0 - 36.0 g/dL   RDW 13.7 11.5 - 15.5 %   Platelets 280 150 - 400 K/uL    MAU Course  Procedures None  MDM UA, CBC today UPT - negative  Assessment and Plan  A: Abnormal uterine bleeding Vaginal itching  P: Discharge home Rx for Ibuprofen, Percocet and Diflucan given to patient Bleeding precautions discussed Outpatient [place] ordered. They will call patient with appointment date/time Patient advised to follow-up with [facility] on 11/02/15 at 1:00 pm for further eval Patient may return to MAU as needed or if her condition were to change or worsen   Luvenia Redden, PA-C  10/27/2015, 6:48 PM

## 2015-11-02 ENCOUNTER — Encounter: Payer: Self-pay | Admitting: Medical

## 2015-11-02 ENCOUNTER — Ambulatory Visit (INDEPENDENT_AMBULATORY_CARE_PROVIDER_SITE_OTHER): Payer: Medicaid Other | Admitting: Medical

## 2015-11-02 ENCOUNTER — Ambulatory Visit (HOSPITAL_COMMUNITY)
Admission: RE | Admit: 2015-11-02 | Discharge: 2015-11-02 | Disposition: A | Discharge status: Home / Self Care | Payer: Medicaid Other | Source: Ambulatory Visit | Attending: Medical | Admitting: Medical

## 2015-11-02 ENCOUNTER — Other Ambulatory Visit (HOSPITAL_COMMUNITY)
Admission: RE | Admit: 2015-11-02 | Discharge: 2015-11-02 | Disposition: A | Discharge status: Home / Self Care | Payer: Medicaid Other | Source: Ambulatory Visit | Attending: Medical | Admitting: Medical

## 2015-11-02 VITALS — BP 136/86 | HR 69 | Temp 98.5°F | Wt 199.5 lb

## 2015-11-02 DIAGNOSIS — N898 Other specified noninflammatory disorders of vagina: Secondary | ICD-10-CM

## 2015-11-02 DIAGNOSIS — N926 Irregular menstruation, unspecified: Secondary | ICD-10-CM

## 2015-11-02 DIAGNOSIS — L298 Other pruritus: Secondary | ICD-10-CM | POA: Diagnosis present

## 2015-11-02 DIAGNOSIS — N939 Abnormal uterine and vaginal bleeding, unspecified: Secondary | ICD-10-CM

## 2015-11-02 DIAGNOSIS — D251 Intramural leiomyoma of uterus: Secondary | ICD-10-CM | POA: Insufficient documentation

## 2015-11-02 DIAGNOSIS — D259 Leiomyoma of uterus, unspecified: Secondary | ICD-10-CM

## 2015-11-02 DIAGNOSIS — Z01812 Encounter for preprocedural laboratory examination: Secondary | ICD-10-CM | POA: Diagnosis not present

## 2015-11-02 DIAGNOSIS — Z3202 Encounter for pregnancy test, result negative: Secondary | ICD-10-CM

## 2015-11-02 LAB — POCT PREGNANCY, URINE
Preg Test, Ur: NEGATIVE
Preg Test, Ur: NEGATIVE

## 2015-11-02 MED ORDER — NYSTATIN 100000 UNIT/GM EX CREA: TOPICAL_CREAM | CUTANEOUS | Status: DC

## 2015-11-02 NOTE — Addendum Note (Signed)
Addended by: Michel Harrow on: 11/02/2015 04:14 PM   Modules accepted: Orders

## 2015-11-02 NOTE — Progress Notes (Signed)
Patient ID: [name], female   DOB: August 27, 1981, 34 y.o.   MRN: [id number]  History:  Ms. BENJIE WORTHAN Toney Rakes is a 34 y.o. [id number] who presents to clinic today for follow-up after MAU visit. The patient was seen by me in MAU on 10/27/15 with new onset of vaginal bleeding and lower abdominal pain. The patient states a history of irregular periods with dysmenorrhea. She also states pain with intercourse recently. She states that prior to onset of bleeding on 10/27/15 she had LMP of 10/16/15. She had a stable Hgb and negative UPT on 10/27/15. She states that since that day she has had bleeding off and on, but denies bleeding today. She continues to have lower abdominal pain intermittently and is taking Ibuprofen and Percocet. She also complains of continued vaginal itching even after treatment with Diflucan. She is also concerned that she is in need of a pap smear as she has not had one in a few years and her PCP refuses to perform pap smear, per patient.    The following portions of the patient's history were reviewed and updated as appropriate: allergies, current medications, family history, past medical history, social history, past surgical history and problem list.  Review of Systems:  Review of Systems  Constitutional: Positive for malaise/fatigue. Negative for fever.  Gastrointestinal: Positive for abdominal pain.  Genitourinary:       Neg - vaginal bleeding, discharge  Neurological: Negative for dizziness, loss of consciousness and weakness.     Objective:  Physical Exam BP 136/86 mmHg  Pulse 69  Temp(Src) 98.5 F (36.9 C) (Oral)  Wt 199 lb 8 oz (90.493 kg)  LMP 10/17/2015 (Exact Date) Physical Exam  Constitutional: She is oriented to person, place, and time. She appears well-developed and well-nourished. No distress.  HENT:  Head: Normocephalic.  Cardiovascular: Normal rate.   Pulmonary/Chest: Effort normal.  Abdominal: Soft. She exhibits no distension.   Genitourinary: There is no rash, tenderness or lesion on the right labia. There is no rash, tenderness or lesion on the left labia. Cervix exhibits no motion tenderness, no discharge and no friability. No bleeding in the vagina. Injury: small amount of mucus discharge noted. Vaginal discharge found.  Neurological: She is alert and oriented to person, place, and time.  Skin: Skin is warm and dry. No erythema.  Psychiatric: She has a normal mood and affect. Her behavior is normal.  Vitals reviewed.  Labs and Imaging Results for orders placed or performed in visit on 11/02/15 (from the past 24 hour(s))  Pregnancy, urine POC     Status: None   Collection Time: 11/02/15  1:38 PM  Result Value Ref Range   Preg Test, Ur NEGATIVE NEGATIVE    GYN Procedure - Endometrial Biopsy Patient given informed consent, signed copy in the chart, time out was performed. Appropriate time out taken. . The patient was placed in the lithotomy position and the cervix brought into view with sterile speculum.  Portio of cervix cleansed x 2 with betadine swabs.  A tenaculum was placed in the anterior lip of the cervix.  The uterus was sounded for depth of 7 cm. A pipelle was introduced to into the uterus, suction created,  and an endometrial sample was obtained. All equipment was removed and accounted for.  The patient tolerated the procedure well. Patient given post procedure instructions.   MDM Discussed options for bleeding control as long as endometrial biopsy results are normal Patient will consider Liletta IUD Assessment & Plan:  Assessment: Fibroid Uterus Abnormal uterine bleeding Vaginal itching  Plans: Endometrial biopsy today. Will await results for further managment Rx for Nystatin sent to patient's pharmacy Advised to continue Percocet and Ibuprofen PRN for pain Patient to return to [facility] in 2 weeks for IUD insertion and pap smear or sooner if symptoms were to change or worsen  Luvenia Redden,  PA-C 11/02/2015 2:08 PM

## 2015-11-02 NOTE — Patient Instructions (Signed)
Levonorgestrel intrauterine device (IUD) What is this medicine? LEVONORGESTREL IUD (LEE voe nor jes trel) is a contraceptive (birth control) device. The device is placed inside the uterus by a healthcare professional. It is used to prevent pregnancy and can also be used to treat heavy bleeding that occurs during your period. Depending on the device, it can be used for 3 to 5 years. This medicine may be used for other purposes; ask your health care provider or pharmacist if you have questions. What should I tell my health care provider before I take this medicine? They need to know if you have any of these conditions: -abnormal Pap smear -cancer of the breast, uterus, or cervix -diabetes -endometritis -genital or pelvic infection now or in the past -have more than one sexual partner or your partner has more than one partner -heart disease -history of an ectopic or tubal pregnancy -immune system problems -IUD in place -liver disease or tumor -problems with blood clots or take blood-thinners -use intravenous drugs -uterus of unusual shape -vaginal bleeding that has not been explained -an unusual or allergic reaction to levonorgestrel, other hormones, silicone, or polyethylene, medicines, foods, dyes, or preservatives -pregnant or trying to get pregnant -breast-feeding How should I use this medicine? This device is placed inside the uterus by a health care professional. Talk to your pediatrician regarding the use of this medicine in children. Special care may be needed. Overdosage: If you think you have taken too much of this medicine contact a poison control center or emergency room at once. NOTE: This medicine is only for you. Do not share this medicine with others. What if I miss a dose? This does not apply. What may interact with this medicine? Do not take this medicine with any of the following medications: -amprenavir -bosentan -fosamprenavir This medicine may also interact with  the following medications: -aprepitant -barbiturate medicines for inducing sleep or treating seizures -bexarotene -griseofulvin -medicines to treat seizures like carbamazepine, ethotoin, felbamate, oxcarbazepine, phenytoin, topiramate -modafinil -pioglitazone -rifabutin -rifampin -rifapentine -some medicines to treat HIV infection like atazanavir, indinavir, lopinavir, nelfinavir, tipranavir, ritonavir -St. John's wort -warfarin This list may not describe all possible interactions. Give your health care provider a list of all the medicines, herbs, non-prescription drugs, or dietary supplements you use. Also tell them if you smoke, drink alcohol, or use illegal drugs. Some items may interact with your medicine. What should I watch for while using this medicine? Visit your doctor or health care professional for regular check ups. See your doctor if you or your partner has sexual contact with others, becomes HIV positive, or gets a sexual transmitted disease. This product does not protect you against HIV infection (AIDS) or other sexually transmitted diseases. You can check the placement of the IUD yourself by reaching up to the top of your vagina with clean fingers to feel the threads. Do not pull on the threads. It is a good habit to check placement after each menstrual period. Call your doctor right away if you feel more of the IUD than just the threads or if you cannot feel the threads at all. The IUD may come out by itself. You may become pregnant if the device comes out. If you notice that the IUD has come out use a backup birth control method like condoms and call your health care provider. Using tampons will not change the position of the IUD and are okay to use during your period. What side effects may I notice from receiving this medicine?   Side effects that you should report to your doctor or health care professional as soon as possible: -allergic reactions like skin rash, itching or  hives, swelling of the face, lips, or tongue -fever, flu-like symptoms -genital sores -high blood pressure -no menstrual period for 6 weeks during use -pain, swelling, warmth in the leg -pelvic pain or tenderness -severe or sudden headache -signs of pregnancy -stomach cramping -sudden shortness of breath -trouble with balance, talking, or walking -unusual vaginal bleeding, discharge -yellowing of the eyes or skin Side effects that usually do not require medical attention (report to your doctor or health care professional if they continue or are bothersome): -acne -breast pain -change in sex drive or performance -changes in weight -cramping, dizziness, or faintness while the device is being inserted -headache -irregular menstrual bleeding within first 3 to 6 months of use -nausea This list may not describe all possible side effects. Call your doctor for medical advice about side effects. You may report side effects to FDA at 1-800-FDA-1088. Where should I keep my medicine? This does not apply. NOTE: This sheet is a summary. It may not cover all possible information. If you have questions about this medicine, talk to your doctor, pharmacist, or health care provider.    2016, Elsevier/Gold Standard. (2012-01-09 13:54:04) Endometrial Biopsy, Care After Refer to this sheet in the next few weeks. These instructions provide you with information on caring for yourself after your procedure. Your health care provider may also give you more specific instructions. Your treatment has been planned according to current medical practices, but problems sometimes occur. Call your health care provider if you have any problems or questions after your procedure. WHAT TO EXPECT AFTER THE PROCEDURE After your procedure, it is typical to have the following:  You may have mild cramping and a small amount of vaginal bleeding for a few days after the procedure. This is normal. HOME CARE INSTRUCTIONS  Only  take over-the-counter or prescription medicine as directed by your health care provider.  Do not douche, use tampons, or have sexual intercourse until your health care provider approves.  Follow your health care provider's instructions regarding any activity restrictions, such as strenuous exercise or heavy lifting. SEEK MEDICAL CARE IF:  You have heavy bleeding or bleeding longer than 2 days after the procedure.  You have bad smelling drainage from your vagina.  You have a fever and chills.  Youhave severe lower stomach (abdominal) pain. SEEK IMMEDIATE MEDICAL CARE IF:  You have severe cramps in your stomach or back.  You pass large blood clots.  Your bleeding increases.  You become weak or lightheaded, or you pass out.   This information is not intended to replace advice given to you by your health care provider. Make sure you discuss any questions you have with your health care provider.   Document Released: 09/29/2013 Document Reviewed: 09/29/2013 Elsevier Interactive Patient Education [employer].

## 2015-11-06 ENCOUNTER — Other Ambulatory Visit: Payer: Self-pay | Admitting: Medical

## 2015-11-07 ENCOUNTER — Telehealth: Payer: Self-pay | Admitting: *Deleted

## 2015-11-07 NOTE — Telephone Encounter (Signed)
Erroneous encounter

## 2015-11-08 ENCOUNTER — Ambulatory Visit (INDEPENDENT_AMBULATORY_CARE_PROVIDER_SITE_OTHER): Payer: Medicaid Other | Admitting: Family Medicine

## 2015-11-08 ENCOUNTER — Encounter: Payer: Self-pay | Admitting: Family Medicine

## 2015-11-08 ENCOUNTER — Other Ambulatory Visit (HOSPITAL_COMMUNITY)
Admission: RE | Admit: 2015-11-08 | Discharge: 2015-11-08 | Disposition: A | Discharge status: Home / Self Care | Payer: Medicaid Other | Source: Ambulatory Visit | Attending: Family Medicine | Admitting: Family Medicine

## 2015-11-08 VITALS — BP 120/81 | HR 79 | Temp 98.3°F | Wt 199.4 lb

## 2015-11-08 DIAGNOSIS — A599 Trichomoniasis, unspecified: Secondary | ICD-10-CM | POA: Diagnosis not present

## 2015-11-08 DIAGNOSIS — D259 Leiomyoma of uterus, unspecified: Secondary | ICD-10-CM

## 2015-11-08 DIAGNOSIS — Z30430 Encounter for insertion of intrauterine contraceptive device: Secondary | ICD-10-CM

## 2015-11-08 DIAGNOSIS — Z01419 Encounter for gynecological examination (general) (routine) without abnormal findings: Secondary | ICD-10-CM | POA: Diagnosis present

## 2015-11-08 DIAGNOSIS — N939 Abnormal uterine and vaginal bleeding, unspecified: Secondary | ICD-10-CM

## 2015-11-08 DIAGNOSIS — Z113 Encounter for screening for infections with a predominantly sexual mode of transmission: Secondary | ICD-10-CM | POA: Insufficient documentation

## 2015-11-08 DIAGNOSIS — Z1151 Encounter for screening for human papillomavirus (HPV): Secondary | ICD-10-CM | POA: Insufficient documentation

## 2015-11-08 LAB — POCT PREGNANCY, URINE: Preg Test, Ur: NEGATIVE

## 2015-11-08 NOTE — Patient Instructions (Signed)
Intrauterine Device Insertion Most often, an intrauterine device (IUD) is inserted into the uterus to prevent pregnancy. There are 2 types of IUDs available:  Copper IUD--This type of IUD creates an environment that is not favorable to sperm survival. The mechanism of action of the copper IUD is not known for certain. It can stay in place for 10 years.  Hormone IUD--This type of IUD contains the hormone progestin (synthetic progesterone). The progestin thickens the cervical mucus and prevents sperm from entering the uterus, and it also thins the uterine lining. There is no evidence that the hormone IUD prevents implantation. One hormone IUD can stay in place for up to 5 years, and a different hormone IUD can stay in place for up to 3 years. An IUD is the most cost-effective birth control if left in place for the full duration. It may be removed at any time. LET YOUR HEALTH CARE PROVIDER KNOW ABOUT:  Any allergies you have.  All medicines you are taking, including vitamins, herbs, eye drops, creams, and over-the-counter medicines.  Previous problems you or members of your family have had with the use of anesthetics.  Any blood disorders you have.  Previous surgeries you have had.  Possibility of pregnancy.  Medical conditions you have. RISKS AND COMPLICATIONS  Generally, intrauterine device insertion is a safe procedure. However, as with any procedure, complications can occur. Possible complications include:  Accidental puncture (perforation) of the uterus.  Accidental placement of the IUD either in the muscle layer of the uterus (myometrium) or outside the uterus. If this happens, the IUD can be found essentially floating around the bowels and must be taken out surgically.  The IUD may fall out of the uterus (expulsion). This is more common in women who have recently had a child.   Pregnancy in the fallopian tube (ectopic).  Pelvic inflammatory disease (PID), which is infection of  the uterus and fallopian tubes. The risk of PID is slightly increased in the first 20 days after the IUD is placed, but the overall risk is still very low. BEFORE THE PROCEDURE  Schedule the IUD insertion for when you will have your menstrual period or right after, to make sure you are not pregnant. Placement of the IUD is better tolerated shortly after a menstrual cycle.  You may need to take tests or be examined to make sure you are not pregnant.  You may be required to take a pregnancy test.  You may be required to get checked for sexually transmitted infections (STIs) prior to placement. Placing an IUD in someone who has an infection can make the infection worse.  You may be given a pain reliever to take 1 or 2 hours before the procedure.  An exam will be performed to determine the size and position of your uterus.  Ask your health care provider about changing or stopping your regular medicines. PROCEDURE   A tool (speculum) is placed in the vagina. This allows your health care provider to see the lower part of the uterus (cervix).  The cervix is prepped with a medicine that lowers the risk of infection.  You may be given a medicine to numb each side of the cervix (intracervical or paracervical block). This is used to block and control any discomfort with insertion.  A tool (uterine sound) is inserted into the uterus to determine the length of the uterine cavity and the direction the uterus may be tilted.  A slim instrument (IUD inserter) is inserted through the cervical   canal and into your uterus.  The IUD is placed in the uterine cavity and the insertion device is removed.  The nylon string that is attached to the IUD and used for eventual IUD removal is trimmed. It is trimmed so that it lays high in the vagina, just outside the cervix. AFTER THE PROCEDURE  You may have bleeding after the procedure. This is normal. It varies from light spotting for a few days to menstrual-like  bleeding.  You may have mild cramping.   This information is not intended to replace advice given to you by your health care provider. Make sure you discuss any questions you have with your health care provider.   Document Released: 08/07/2011 Document Revised: 09/29/2013 Document Reviewed: 05/30/2013 Elsevier Interactive Patient Education 2016 Elsevier Inc.  

## 2015-11-08 NOTE — Progress Notes (Addendum)
Patient ID: [name], female   DOB: 1981/01/01, 34 y.o.   MRN: [id number]   [place] PROCEDURE NOTE  [name] is a 34 y.o. [id number] here for IUD insertion. Last pap smear "years ago" and needs one. She is also here for IUD insertion due to abnormal uterine bleeding.   IUD Insertion Procedure Note Patient identified, informed consent performed, consent signed.   Discussed risks of irregular bleeding, cramping, infection, malpositioning or misplacement of the IUD outside the uterus which may require further procedure such as laparoscopy. Time out was performed.  Urine pregnancy test negative.  Speculum placed in the vagina.  Cervix visualized.  Cleaned with Betadine x 2.   Uterus sounded to 7 cm and the sound passed easily. Upon insertion of Liletta applicator there was a noted spasm of internal os. Grasped anteriorly with a single tooth tenaculum. Gentle pressure was held and the applicator passed easily through the internal os. Liletta IUD placed per manufacturer's recommendations.  Strings trimmed to 3 cm. Tenaculum was removed, good hemostasis noted.  Patient tolerated procedure well.   Patient was given post-procedure instructions.  She was advised to have backup contraception for one week.  Patient was also asked to check IUD strings periodically and follow up in 4 weeks for IUD check.   GYNECOLOGY CLINIC-PROGRESS NOTE  History:  34 y.o. [id number] here today for today for IUD insertion and Pap smear.   The following portions of the patient's history were reviewed and updated as appropriate: allergies, current medications, past family history, past medical history, past social history, past surgical history and problem list. Last pap smear- years ago and is needed today.    Review of Systems:  Pertinent items are noted in HPI.  Objective:  Physical Exam Blood pressure 120/81, pulse 79, temperature 98.3 F (36.8 C), temperature source Oral, weight 199 lb  6.4 oz (90.447 kg), last menstrual period 10/17/2015. Gen: NAD Abd: Soft, nontender and nondistended Pelvic: Normal appearing external genitalia; normal appearing vaginal mucosa and cervix.  IUD strings visualized, about 3 cm in length outside cervix.  Collected Pap smear prior to insertion and it should be noted that patient's cervix was friable and we collected a wet prep and GC/CT as well.   Assessment & Plan:  #IUD Insertion: placed successfully, had cramping and was given Ibuprofen 800 mg in office. Patient to keep IUD in place for five years; can come in for removal if she desires pregnancy within the next five years. Discussed feeling for strings and allowed to feel string after procedure.   #GYN exam: Pap smear sent as well as cultures. Routine preventative health maintenance measures emphasized  Caren Macadam, MD  Family Medicine, OB fellow [facility] [place]

## 2015-11-09 ENCOUNTER — Encounter (HOSPITAL_COMMUNITY): Payer: Self-pay | Admitting: *Deleted

## 2015-11-09 ENCOUNTER — Inpatient Hospital Stay (HOSPITAL_COMMUNITY)
Admission: AD | Admit: 2015-11-09 | Discharge: 2015-11-09 | Disposition: A | Discharge status: Home / Self Care | Payer: Medicaid Other | Source: Ambulatory Visit | Attending: Obstetrics and Gynecology | Admitting: Obstetrics and Gynecology

## 2015-11-09 DIAGNOSIS — M5431 Sciatica, right side: Secondary | ICD-10-CM | POA: Diagnosis not present

## 2015-11-09 DIAGNOSIS — F17210 Nicotine dependence, cigarettes, uncomplicated: Secondary | ICD-10-CM | POA: Insufficient documentation

## 2015-11-09 DIAGNOSIS — Z30431 Encounter for routine checking of intrauterine contraceptive device: Secondary | ICD-10-CM | POA: Diagnosis not present

## 2015-11-09 DIAGNOSIS — M79606 Pain in leg, unspecified: Secondary | ICD-10-CM | POA: Diagnosis present

## 2015-11-09 LAB — GC/CHLAMYDIA PROBE AMP (~~LOC~~) NOT AT ARMC
Chlamydia: NEGATIVE
Neisseria Gonorrhea: NEGATIVE

## 2015-11-09 LAB — WET PREP, GENITAL
Trich, Wet Prep: NONE SEEN
Yeast Wet Prep HPF POC: NONE SEEN

## 2015-11-09 MED ORDER — IBUPROFEN 800 MG PO TABS
800.0000 mg | ORAL_TABLET | Freq: Three times a day (TID) | ORAL | Status: DC | PRN
Start: 2015-11-09 — End: 2016-01-14

## 2015-11-09 MED ORDER — OXYCODONE-ACETAMINOPHEN 5-325 MG PO TABS
2.0000 | ORAL_TABLET | Freq: Once | ORAL | Status: AC
  Administered 2015-11-09: 2 via ORAL
  Filled 2015-11-09: qty 2

## 2015-11-09 MED ORDER — DIPHENHYDRAMINE HCL 25 MG PO CAPS
25.0000 mg | ORAL_CAPSULE | Freq: Once | ORAL | Status: AC
  Administered 2015-11-09: 25 mg via ORAL
  Filled 2015-11-09: qty 1

## 2015-11-09 NOTE — Discharge Instructions (Signed)

## 2015-11-09 NOTE — MAU Note (Signed)
Had IUD placement yesterday in the office; pain started right after they placed it and has continued all day. Some spotting today and mucus noted. Pain on left side/numbness on right.

## 2015-11-09 NOTE — MAU Provider Note (Signed)
History     CSN: [id number]  Arrival date and time: 11/09/15 [id number]   First Provider Initiated Contact with Patient 11/09/15 2049      Chief Complaint  Patient presents with  . Vaginal Pain  . Leg Pain   HPI Comments: [name] Toney Rakes is a 34 y.o. [id number] who presents today with shooting pain down her right leg. She states that it started after she had her IUD placed yesterday, and she wasn't sure if it was related to the ID insertion. She is also concerned because she can't see her IUD strings. She was under the impression that the strings would be protruding from the vagina.   Leg Pain  The incident occurred 12 to 24 hours ago. There was no injury mechanism. The pain is present in the right hip and right thigh. The quality of the pain is described as shooting. The pain is at a severity of 10/10. The pain has been constant since onset. Associated symptoms include numbness. Pertinent negatives include no loss of sensation, muscle weakness or tingling. She has tried NSAIDs (around 1600 today ) for the symptoms. The treatment provided no relief.     Past Medical History  Diagnosis Date  . Irritable bowel   . Asthma     meds as needed  . Ectopic pregnancy     requiring surgery  . Gonorrhea   . Preterm labor   . Arthritis   . Chronic back pain   . Migraine headache     Past Surgical History  Procedure Laterality Date  . Wisdom tooth extraction    . Tubal ligation  2005  . Laparoscopy  05/30/2012    Procedure: LAPAROSCOPY OPERATIVE;  Surgeon: Osborne Oman, MD;  Location: [facility];  Service: Gynecology;  Laterality: N/A;  Operative laparoscopy right salpingectomy and removal of ectopic pregnancy  . Salpingectomy    . Laparoscopy for ectopic pregnancy      Family History  Problem Relation Age of Onset  . Asthma Brother     Social History  Substance Use Topics  . Smoking status: Current Every Day Smoker -- 0.25 packs/day    Types: Cigarettes  . Smokeless tobacco:  Never Used  . Alcohol Use: Yes     Comment: occasional    Allergies:  Allergies  Allergen Reactions  . Hydrocodeine [Dihydrocodeine] Hives  . Latex Hives  . Raspberry Hives  . Vioxx [Rofecoxib] Hives    Prescriptions prior to admission  Medication Sig Dispense Refill Last Dose  . albuterol (PROVENTIL HFA;VENTOLIN HFA) 108 (90 BASE) MCG/ACT inhaler Inhale 1-2 puffs into the lungs every 6 (six) hours as needed for wheezing or shortness of breath. 1 Inhaler 0 11/09/2015 at Unknown time  . albuterol (PROVENTIL) (2.5 MG/3ML) 0.083% nebulizer solution Take 3 mLs (2.5 mg total) by nebulization every 6 (six) hours as needed for wheezing or shortness of breath. 75 mL 1 11/09/2015 at Unknown time  . Ca Carbonate-Mag Hydroxide (ROLAIDS PO) Take 2 tablets by mouth 3 (three) times daily as needed (For heartburn.).   11/08/2015 at Unknown time  . gabapentin (NEURONTIN) 300 MG capsule Take 300 mg by mouth See admin instructions. Take 1 capsule (300 mg) by mouth on the nights that you are home too late to take nortriptyline (take with nortriptyline on Friday and Saturday nights)   Past Month at Unknown time  . ibuprofen (ADVIL,MOTRIN) 800 MG tablet Take 1 tablet (800 mg total) by mouth 3 (three) times daily. 30 tablet 0  11/09/2015 at Unknown time  . Multiple Vitamin (MULTIVITAMIN WITH MINERALS) TABS tablet Take 1 tablet by mouth daily.   11/09/2015 at Unknown time  . nortriptyline (PAMELOR) 10 MG capsule Take 20 mg by mouth See admin instructions. Take 2 capsules (20 mg) by mouth every night that you are home before 8pm   Past Week at Unknown time  . nystatin cream (MYCOSTATIN) Apply to affected area 2 times daily (Patient taking differently: Apply 1 application topically 2 (two) times daily. Apply to affected area 2 times daily) 15 g 0 11/09/2015 at Unknown time  . oxyCODONE-acetaminophen (PERCOCET/ROXICET) 5-325 MG tablet Take 1 tablet by mouth every 6 (six) hours as needed for severe pain. 12 tablet 0  Past Week at Unknown time  . propranolol (INDERAL) 60 MG tablet Take 1 tablet (60 mg total) by mouth 2 (two) times daily. (Patient taking differently: Take 60 mg by mouth 2 (two) times daily as needed (For migraine). ) 60 tablet 12 11/08/2015 at Unknown time  . amoxicillin (AMOXIL) 875 MG tablet Take 1 tablet (875 mg total) by mouth 2 (two) times daily. (Patient not taking: Reported on 11/02/2015) 20 tablet 0 Completed Course at Unknown time  . fluconazole (DIFLUCAN) 150 MG tablet Take 1 tablet (150 mg total) by mouth daily. (Patient not taking: Reported on 11/02/2015) 1 tablet 0 Completed Course at Unknown time    Review of Systems  Constitutional: Negative for fever.  Gastrointestinal: Negative for nausea, vomiting, abdominal pain, diarrhea and constipation.  Genitourinary: Negative for dysuria, urgency and frequency.  Neurological: Positive for numbness. Negative for tingling.   Physical Exam   Blood pressure 145/83, pulse 70, temperature 98.6 F (37 C), temperature source Oral, resp. rate 20, height 5\' 4"  ([id number] m), weight 90.266 kg (199 lb), last menstrual period 10/17/2015.  Physical Exam  Nursing note and vitals reviewed. Constitutional: She is oriented to person, place, and time. She appears well-developed and well-nourished. No distress.  HENT:  Head: Normocephalic.  Cardiovascular: Normal rate.   Respiratory: Effort normal.  GI: Soft. There is no tenderness.  Neurological: She is alert and oriented to person, place, and time.  Skin: Skin is warm and dry.  Psychiatric: She has a normal mood and affect.    MAU Course  Procedures  MDM 2224: Patient reports that pain has improved with percocet.   Assessment and Plan   1. Sciatica of right side    DC home Comfort measures reviewed  RX: ibuprofen 800mg  PRN #[id number]  Return to MAU as needed   Follow-up Information    Follow up with Elyn Peers, MD.   Specialty:  Family Medicine   Why:  If symptoms worsen   Contact  information:   [address] [place] [address] [phone]         Mathis Bud 11/09/2015, 8:50 PM

## 2015-11-15 LAB — CYTOLOGY - PAP

## 2015-11-17 MED ORDER — METRONIDAZOLE 500 MG PO TABS: ORAL_TABLET | ORAL | Status: DC

## 2015-11-17 NOTE — Addendum Note (Signed)
Addended by: Caryl Bis on: 11/17/2015 06:31 PM   Modules accepted: Orders

## 2015-11-18 ENCOUNTER — Encounter (HOSPITAL_COMMUNITY): Payer: Self-pay | Admitting: *Deleted

## 2015-11-18 ENCOUNTER — Inpatient Hospital Stay (HOSPITAL_COMMUNITY)
Admission: AD | Admit: 2015-11-18 | Discharge: 2015-11-18 | Disposition: A | Discharge status: Home / Self Care | Payer: Medicaid Other | Source: Ambulatory Visit | Attending: Obstetrics & Gynecology | Admitting: Obstetrics & Gynecology

## 2015-11-18 DIAGNOSIS — N939 Abnormal uterine and vaginal bleeding, unspecified: Secondary | ICD-10-CM | POA: Insufficient documentation

## 2015-11-18 DIAGNOSIS — R102 Pelvic and perineal pain: Secondary | ICD-10-CM

## 2015-11-18 LAB — URINALYSIS, ROUTINE W REFLEX MICROSCOPIC
Bilirubin Urine: NEGATIVE
Glucose, UA: NEGATIVE mg/dL
Ketones, ur: NEGATIVE mg/dL
NITRITE: NEGATIVE
PH: 6 (ref 5.0–8.0)
Protein, ur: NEGATIVE mg/dL
SPECIFIC GRAVITY, URINE: 1.01 (ref 1.005–1.030)

## 2015-11-18 LAB — URINE MICROSCOPIC-ADD ON: Bacteria, UA: NONE SEEN

## 2015-11-18 LAB — CBC
HCT: 36.8 % (ref 36.0–46.0)
HEMOGLOBIN: 12.1 g/dL (ref 12.0–15.0)
MCH: 29.3 pg (ref 26.0–34.0)
MCHC: 32.9 g/dL (ref 30.0–36.0)
MCV: 89.1 fL (ref 78.0–100.0)
PLATELETS: 255 10*3/uL (ref 150–400)
RBC: 4.13 MIL/uL (ref 3.87–5.11)
RDW: 13.3 % (ref 11.5–15.5)
WBC: 8.3 10*3/uL (ref 4.0–10.5)

## 2015-11-18 LAB — WET PREP, GENITAL
CLUE CELLS WET PREP: NONE SEEN
SPERM: NONE SEEN
TRICH WET PREP: NONE SEEN
Yeast Wet Prep HPF POC: NONE SEEN

## 2015-11-18 LAB — POCT PREGNANCY, URINE: Preg Test, Ur: NEGATIVE

## 2015-11-18 MED ORDER — DOXYCYCLINE HYCLATE 100 MG PO CAPS: 100.0000 mg | ORAL_CAPSULE | Freq: Two times a day (BID) | ORAL | Status: DC

## 2015-11-18 NOTE — MAU Note (Addendum)
Pt reports She started bleeding on 11/16/15. Reports bleeding is heavy and bright red  And passing large clots. Had a lighter period around 11/08/15 and also the end of October. Pt stated she feels nauseated and feels like she is going to pass out at times. Cramping  From passing blood clots is a 10/10.

## 2015-11-18 NOTE — Discharge Instructions (Signed)
Take ibuprofen for the pain in addition to the medication we give you. Follow up with your doctor.

## 2015-11-18 NOTE — MAU Provider Note (Signed)
CSN: [id number]     Arrival date & time 11/18/15  1527 History   None    Chief Complaint  Patient presents with  . Vaginal Bleeding     (Consider location/radiation/quality/duration/timing/severity/associated sxs/prior Treatment) Patient is a 34 y.o. female presenting with vaginal bleeding. The history is provided by the patient.  Vaginal Bleeding This is a new problem. The current episode started in the past 7 days. The problem occurs constantly. The problem has been unchanged. Associated symptoms include abdominal pain (cramping), nausea and vomiting.   [name] is a [id number] who presents to the MAU with vaginal bleeding and passing clots that started 4 days ago. BTL for [facility] and then had an IUD placed to try and help with the heavy bleeding. Patient reports changing her pad about 8 times a day x 4 days. She states she feels tired and dizzy. Patient was evaluated here 11/10 for similar problems and Hct was 12.1  Past Medical History  Diagnosis Date  . Irritable bowel   . Asthma     meds as needed  . Ectopic pregnancy     requiring surgery  . Gonorrhea   . Preterm labor   . Arthritis   . Chronic back pain   . Migraine headache    Past Surgical History  Procedure Laterality Date  . Wisdom tooth extraction    . Tubal ligation  2005  . Laparoscopy  05/30/2012    Procedure: LAPAROSCOPY OPERATIVE;  Surgeon: Osborne Oman, MD;  Location: [facility];  Service: Gynecology;  Laterality: N/A;  Operative laparoscopy right salpingectomy and removal of ectopic pregnancy  . Salpingectomy    . Laparoscopy for ectopic pregnancy     Family History  Problem Relation Age of Onset  . Asthma Brother    Social History  Substance Use Topics  . Smoking status: Current Every Day Smoker -- 0.25 packs/day    Types: Cigarettes  . Smokeless tobacco: Never Used  . Alcohol Use: Yes     Comment: occasional   OB History    Gravida Para Term Preterm AB TAB SAB Ectopic Multiple Living   6  3 2 1 2  1 1  3      Review of Systems  Gastrointestinal: Positive for nausea, vomiting and abdominal pain (cramping).  Genitourinary: Positive for vaginal bleeding.  Neurological: Positive for light-headedness.   All other systems negative   Allergies  Hydrocodeine; Latex; Raspberry; and Vioxx  Home Medications   Prior to Admission medications   Medication Sig Start Date End Date Taking? Authorizing Provider  albuterol (PROVENTIL HFA;VENTOLIN HFA) 108 (90 BASE) MCG/ACT inhaler Inhale 1-2 puffs into the lungs every 6 (six) hours as needed for wheezing or shortness of breath. 03/21/15  Yes Tiffany Carlota Raspberry, PA-C  albuterol (PROVENTIL) (2.5 MG/3ML) 0.083% nebulizer solution Take 3 mLs (2.5 mg total) by nebulization every 6 (six) hours as needed for wheezing or shortness of breath. 03/24/15  Yes Robbie Lis, MD  Ca Carbonate-Mag Hydroxide (ROLAIDS PO) Take 2 tablets by mouth 3 (three) times daily as needed (For heartburn.).   Yes Historical Provider, MD  gabapentin (NEURONTIN) 300 MG capsule Take 300 mg by mouth See admin instructions. Take 1 capsule (300 mg) by mouth on the nights that you are home too late to take nortriptyline (take with nortriptyline on Friday and Saturday nights)   Yes Historical Provider, MD  ibuprofen (ADVIL,MOTRIN) 800 MG tablet Take 1 tablet (800 mg total) by mouth every 8 (eight) hours  as needed. 11/09/15  Yes Tresea Mall, CNM  Multiple Vitamin (MULTIVITAMIN WITH MINERALS) TABS tablet Take 1 tablet by mouth daily.   Yes Historical Provider, MD  nortriptyline (PAMELOR) 10 MG capsule Take 20 mg by mouth See admin instructions. Take 2 capsules (20 mg) by mouth every night that you are home before 8pm   Yes Historical Provider, MD  nystatin cream (MYCOSTATIN) Apply to affected area 2 times daily Patient taking differently: Apply 1 application topically 2 (two) times daily. Apply to affected area 2 times daily 11/02/15  Yes Luvenia Redden, PA-C  oxyCODONE-acetaminophen  (PERCOCET/ROXICET) 5-325 MG tablet Take 1 tablet by mouth every 6 (six) hours as needed for severe pain. 10/27/15  Yes Luvenia Redden, PA-C  propranolol (INDERAL) 60 MG tablet Take 1 tablet (60 mg total) by mouth 2 (two) times daily. Patient taking differently: Take 60 mg by mouth 2 (two) times daily as needed (For migraine).  05/09/14  Yes Marcial Pacas, MD  doxycycline (VIBRAMYCIN) 100 MG capsule Take 1 capsule (100 mg total) by mouth 2 (two) times daily. 11/18/15   Karista Aispuro Bunnie Pion, NP  metroNIDAZOLE (FLAGYL) 500 MG tablet Take two tablets by mouth twice a day, for one day.  Or you can take all four tablets at once if you can tolerate it. 11/17/15   Caren Macadam, MD   BP 170/96 mmHg  Pulse 81  Temp(Src) 98.8 F (37.1 C) (Oral)  Resp 18  LMP 10/17/2015 (Exact Date) Physical Exam  Constitutional: She is oriented to person, place, and time. She appears well-developed and well-nourished. No distress.  HENT:  Head: Normocephalic.  Eyes: EOM are normal.  Neck: Neck supple.  Cardiovascular: Normal rate.   Pulmonary/Chest: Effort normal. No respiratory distress.  Abdominal: Soft. There is no rebound and no guarding.  Mildly tender lower abdomen with deep palpation.   Genitourinary:  External genitalia without lesions. Small amount of blood vaginal vault, IUD string visualized from cervical os,  positive CMT and mild bilateral adnexal tenderness. Unable to palpate uterus due to patient habitus.   Musculoskeletal: Normal range of motion.  Neurological: She is alert and oriented to person, place, and time. No cranial nerve deficit.  Skin: Skin is warm and dry.  Psychiatric: She has a normal mood and affect. Her behavior is normal.  Nursing note and vitals reviewed.   ED Course  Procedures (including critical care time) Labs Review Results for orders placed or performed during the hospital encounter of 11/18/15 (from the past 24 hour(s))  Urinalysis, Routine w reflex microscopic (not at  [facility])     Status: Abnormal   Collection Time: 11/18/15  3:35 PM  Result Value Ref Range   Color, Urine RED (A) YELLOW   APPearance CLOUDY (A) CLEAR   Specific Gravity, Urine 1.010 1.005 - 1.030   pH 6.0 5.0 - 8.0   Glucose, UA NEGATIVE NEGATIVE mg/dL   Hgb urine dipstick LARGE (A) NEGATIVE   Bilirubin Urine NEGATIVE NEGATIVE   Ketones, ur NEGATIVE NEGATIVE mg/dL   Protein, ur NEGATIVE NEGATIVE mg/dL   Nitrite NEGATIVE NEGATIVE   Leukocytes, UA SMALL (A) NEGATIVE  Urine microscopic-add on     Status: Abnormal   Collection Time: 11/18/15  3:35 PM  Result Value Ref Range   Squamous Epithelial / LPF 0-5 (A) NONE SEEN   WBC, UA 0-5 0 - 5 WBC/hpf   RBC / HPF TOO NUMEROUS TO COUNT 0 - 5 RBC/hpf   Bacteria, UA NONE SEEN NONE  SEEN  Pregnancy, urine POC     Status: None   Collection Time: 11/18/15  4:02 PM  Result Value Ref Range   Preg Test, Ur NEGATIVE NEGATIVE  CBC     Status: None   Collection Time: 11/18/15  4:20 PM  Result Value Ref Range   WBC 8.3 4.0 - 10.5 K/uL   RBC 4.13 3.87 - 5.11 MIL/uL   Hemoglobin 12.1 12.0 - 15.0 g/dL   HCT 36.8 36.0 - 46.0 %   MCV 89.1 78.0 - 100.0 fL   MCH 29.3 26.0 - 34.0 pg   MCHC 32.9 30.0 - 36.0 g/dL   RDW 13.3 11.5 - 15.5 %   Platelets 255 150 - 400 K/uL  Wet prep, genital     Status: Abnormal   Collection Time: 11/18/15  4:50 PM  Result Value Ref Range   Yeast Wet Prep HPF POC NONE SEEN NONE SEEN   Trich, Wet Prep NONE SEEN NONE SEEN   Clue Cells Wet Prep HPF POC NONE SEEN NONE SEEN   WBC, Wet Prep HPF POC FEW (A) NONE SEEN   Sperm NONE SEEN     Patient is not orthostatic I have reviewed the labs as part of my medical decision making.   MDM  34 y.o. female with abnormal vaginal bleeding and pelvic pain x 4 days. Since patient has CMT and mild adnexal tenderness will start Doxycycline while cultures pending. Stable for d/c without acute abdomen or anemia. Discussed with the patient clinical and lab findings and plan of care and all  questioned fully answered. She will follow up with her GYN or return if any problems arise.   Final diagnoses:  Abnormal vaginal bleeding  Pelvic pain in female

## 2015-11-20 ENCOUNTER — Telehealth: Payer: Self-pay | Admitting: *Deleted

## 2015-11-20 LAB — GC/CHLAMYDIA PROBE AMP (~~LOC~~) NOT AT ARMC
CHLAMYDIA, DNA PROBE: NEGATIVE
Neisseria Gonorrhea: NEGATIVE

## 2015-11-20 NOTE — Telephone Encounter (Signed)
Per message from Dr. Ernestina Patches called [name] and notified her pap smear was negative but did show + trichomonas and prescription sent to her pharmacy for her and her partner. Advised no intimate contact until 2 weeks after both treat medicine. Questions answered, [name] voices understanding.

## 2015-11-22 ENCOUNTER — Telehealth: Payer: Self-pay | Admitting: *Deleted

## 2015-11-22 DIAGNOSIS — N938 Other specified abnormal uterine and vaginal bleeding: Secondary | ICD-10-CM

## 2015-11-22 NOTE — Telephone Encounter (Signed)
Pt has a question about her medication. Please call back .

## 2015-11-23 MED ORDER — NORGESTIMATE-ETH ESTRADIOL 0.25-35 MG-MCG PO TABS: 1.0000 | ORAL_TABLET | Freq: Every day | ORAL | Status: DC

## 2015-11-23 NOTE — Telephone Encounter (Signed)
Patient called back into front office with questions about her medications. Patient states she was given two different antibiotics on different days and doesn't know which she should take. Patient states she has already taken 2 of the flagyl. Told patient she should begin taking the doxycycline every day twice a day for a week straight. Patient verbalized understanding and asked what her appt on 12/15 was for. Told patient it is a string check visit and to f/u from her visit to MAU. Patient reports still bleeding heavy but the clots have got smaller. Patient states she is saturating a pad in less than an hour. Discussed with Dr Elly Modena who recommended one pack of sprintec. Informed patient of one time Rx sent to pharmacy to help with the bleeding and discussed that the bleeding would slow down gradually. Patient verbalized understanding to all and had no questions

## 2015-11-27 ENCOUNTER — Ambulatory Visit: Payer: Self-pay | Admitting: Medical

## 2015-12-07 ENCOUNTER — Ambulatory Visit (INDEPENDENT_AMBULATORY_CARE_PROVIDER_SITE_OTHER): Payer: Medicaid Other | Admitting: Obstetrics and Gynecology

## 2015-12-07 ENCOUNTER — Encounter: Payer: Self-pay | Admitting: Obstetrics and Gynecology

## 2015-12-07 VITALS — BP 144/78 | HR 67 | Temp 98.7°F

## 2015-12-07 DIAGNOSIS — N938 Other specified abnormal uterine and vaginal bleeding: Secondary | ICD-10-CM | POA: Diagnosis not present

## 2015-12-07 MED ORDER — FLUCONAZOLE 150 MG PO TABS: 150.0000 mg | ORAL_TABLET | Freq: Once | ORAL | Status: DC

## 2015-12-07 MED ORDER — NORGESTIMATE-ETH ESTRADIOL 0.25-35 MG-MCG PO TABS: ORAL_TABLET | ORAL | Status: DC

## 2015-12-07 MED ORDER — NORGESTIMATE-ETH ESTRADIOL 0.25-35 MG-MCG PO TABS: 1.0000 | ORAL_TABLET | Freq: Every day | ORAL | Status: DC

## 2015-12-07 NOTE — Progress Notes (Signed)
Patient ID: [name], female   DOB: [birth year], 34 y.o.   MRN: [id number] 34 yo [id number] with h/o DUB and fibroid uterus presenting today for evaluation of heavy vaginal bleeding. Patient had a Mirena IUD placed on 11/08/2015. She reports heavy vaginal bleeding since placement. She was started on OCP which she has been taking for the past 2 weeks. Her bleeding is now reduced to occasional spotting. Patient is hoping to be scheduled for a hysterectomy today  Past Medical History  Diagnosis Date  . Irritable bowel   . Asthma     meds as needed  . Ectopic pregnancy     requiring surgery  . Gonorrhea   . Preterm labor   . Arthritis   . Chronic back pain   . Migraine headache    Past Surgical History  Procedure Laterality Date  . Wisdom tooth extraction    . Tubal ligation  2005  . Laparoscopy  05/30/2012    Procedure: LAPAROSCOPY OPERATIVE;  Surgeon: Osborne Oman, MD;  Location: [facility];  Service: Gynecology;  Laterality: N/A;  Operative laparoscopy right salpingectomy and removal of ectopic pregnancy  . Salpingectomy    . Laparoscopy for ectopic pregnancy      Family History  Problem Relation Age of Onset  . Asthma Brother    Social History  Substance Use Topics  . Smoking status: Current Every Day Smoker -- 0.25 packs/day    Types: Cigarettes  . Smokeless tobacco: Never Used  . Alcohol Use: Yes     Comment: occasional   ROS See pertinent in HPI  Blood pressure 144/78, pulse 67, temperature 98.7 F (37.1 C). GENERAL: Well-developed, well-nourished female in no acute distress.  PELVIC: Normal external female genitalia. Vagina is pink and rugated.  Normal discharge. Normal appearing cervix. IUD strings visualized at the os. Uterus is normal in size. No adnexal mass or tenderness. EXTREMITIES: No cyanosis, clubbing, or edema, 2+ distal pulses.  A/P 34 yo with DUB s/p IUD placement a month ago - IUD appears to be in appropriate location - OCP taper provided.  Advised to continue OCP for a total of 3 months - Reminded the patient that irregular vaginal bleeding is expected following IUD placement and may last up to 6 months - Discussed endometrial ablation.  - Advised patient to keep the IUD for at least 6 months to see if it will control her DUB before resorting to endometrial ablation - RTC prn

## 2015-12-07 NOTE — Patient Instructions (Signed)
Endometrial Ablation °Endometrial ablation removes the lining of the uterus (endometrium). It is usually a same-day, outpatient treatment. Ablation helps avoid major surgery, such as surgery to remove the cervix and uterus (hysterectomy). After endometrial ablation, you will have little or no menstrual bleeding and may not be able to have children. However, if you are premenopausal, you will need to use a reliable method of birth control following the procedure because of the small chance that pregnancy can occur. °There are different reasons to have this procedure. These reasons include: °· Heavy periods. °· Bleeding that is causing anemia. °· Irregular bleeding. °· Bleeding fibroids on the lining inside the uterus if they are smaller than 3 centimeters. °This procedure may not be possible for you if:  °· You want to have children in the future.   °· You have severe cramps with your menstrual period.   °· You have precancerous or cancerous cells in your uterus.   °· You were recently pregnant.   °· You have gone through menopause.   °· You have had major surgery on your uterus, resulting in thinning of the uterine wall. Surgeries may include: °¨ The removal of one or more uterine fibroids (myomectomy). °¨ A cesarean section with a classic (vertical) incision on your uterus. Ask your health care provider what type of cesarean you had. Sometimes the scar on your skin is different than the scar on your uterus. °Even if you have had surgery on your uterus, certain types of ablation may still be safe for you. Talk with your health care provider. °LET YOUR HEALTH CARE PROVIDER KNOW ABOUT: °· Any allergies you have. °· All medicines you are taking, including vitamins, herbs, eye drops, creams, and over-the-counter medicines. °· Previous problems you or members of your family have had with the use of anesthetics. °· Any blood disorders you have. °· Previous surgeries you have had. °· Medical conditions you have. °RISKS AND  COMPLICATIONS  °Generally, this is a safe procedure. However, as with any procedure, complications can occur. Possible complications include: °· Perforation of the uterus. °· Bleeding. °· Infection of the uterus, bladder, or vagina. °· Injury to surrounding organs. °· An air bubble to the lung (air embolus). °· Pregnancy following the procedure. °· Failure of the procedure to help the problem, requiring hysterectomy. °· Decreased ability to diagnose cancer in the lining of the uterus. °BEFORE THE PROCEDURE °· The lining of the uterus must be tested to make sure there is no pre-cancerous or cancer cells present. °· An ultrasound may be performed to look at the size of the uterus and to check for abnormalities. °· Medicines may be given to thin the lining of the uterus. °PROCEDURE  °During the procedure, your health care provider will use a tool called a resectoscope to help see inside your uterus. There are different ways to remove the lining of your uterus.  °· Radiofrequency - This method uses a radiofrequency-alternating electric current to remove the lining of the uterus. °· Cryotherapy - This method uses extreme cold to freeze the lining of the uterus. °· Heated-Free Liquid - This method uses heated salt (saline) solution to remove the lining of the uterus. °· Microwave - This method uses high-energy microwaves to heat up the lining of the uterus to remove it. °· Thermal balloon - This method involves inserting a catheter with a balloon tip into the uterus. The balloon tip is filled with heated fluid to remove the lining of the uterus. °AFTER THE PROCEDURE  °After your procedure, do   not have sexual intercourse or insert anything into your vagina until permitted by your health care provider. After the procedure, you may experience: °· Cramps. °· Vaginal discharge. °· Frequent urination. °  °This information is not intended to replace advice given to you by your health care provider. Make sure you discuss any  questions you have with your health care provider. °  °Document Released: 10/18/2004 Document Revised: 08/30/2015 Document Reviewed: 05/12/2013 °[employer]vier Interactive Patient Education ©2016 [employer] ° °

## 2015-12-27 ENCOUNTER — Emergency Department (HOSPITAL_COMMUNITY): Payer: Medicaid Other

## 2015-12-27 ENCOUNTER — Encounter (HOSPITAL_COMMUNITY): Payer: Self-pay | Admitting: Emergency Medicine

## 2015-12-27 ENCOUNTER — Emergency Department (HOSPITAL_COMMUNITY)
Admission: EM | Admit: 2015-12-27 | Discharge: 2015-12-27 | Disposition: A | Discharge status: Home / Self Care | Payer: Medicaid Other | Attending: Emergency Medicine | Admitting: Emergency Medicine

## 2015-12-27 DIAGNOSIS — R112 Nausea with vomiting, unspecified: Secondary | ICD-10-CM | POA: Diagnosis not present

## 2015-12-27 DIAGNOSIS — Z79818 Long term (current) use of other agents affecting estrogen receptors and estrogen levels: Secondary | ICD-10-CM | POA: Diagnosis not present

## 2015-12-27 DIAGNOSIS — J45901 Unspecified asthma with (acute) exacerbation: Secondary | ICD-10-CM | POA: Diagnosis not present

## 2015-12-27 DIAGNOSIS — R197 Diarrhea, unspecified: Secondary | ICD-10-CM | POA: Insufficient documentation

## 2015-12-27 DIAGNOSIS — R1084 Generalized abdominal pain: Secondary | ICD-10-CM | POA: Diagnosis not present

## 2015-12-27 DIAGNOSIS — N939 Abnormal uterine and vaginal bleeding, unspecified: Secondary | ICD-10-CM | POA: Insufficient documentation

## 2015-12-27 DIAGNOSIS — Z8751 Personal history of pre-term labor: Secondary | ICD-10-CM | POA: Insufficient documentation

## 2015-12-27 DIAGNOSIS — G43909 Migraine, unspecified, not intractable, without status migrainosus: Secondary | ICD-10-CM | POA: Diagnosis not present

## 2015-12-27 DIAGNOSIS — M1990 Unspecified osteoarthritis, unspecified site: Secondary | ICD-10-CM | POA: Insufficient documentation

## 2015-12-27 DIAGNOSIS — Z8719 Personal history of other diseases of the digestive system: Secondary | ICD-10-CM | POA: Insufficient documentation

## 2015-12-27 DIAGNOSIS — J40 Bronchitis, not specified as acute or chronic: Secondary | ICD-10-CM

## 2015-12-27 DIAGNOSIS — Z79899 Other long term (current) drug therapy: Secondary | ICD-10-CM | POA: Diagnosis not present

## 2015-12-27 DIAGNOSIS — Z91040 Latex allergy status: Secondary | ICD-10-CM | POA: Insufficient documentation

## 2015-12-27 DIAGNOSIS — G8929 Other chronic pain: Secondary | ICD-10-CM | POA: Insufficient documentation

## 2015-12-27 DIAGNOSIS — Z8619 Personal history of other infectious and parasitic diseases: Secondary | ICD-10-CM | POA: Insufficient documentation

## 2015-12-27 DIAGNOSIS — R05 Cough: Secondary | ICD-10-CM | POA: Diagnosis present

## 2015-12-27 DIAGNOSIS — F17210 Nicotine dependence, cigarettes, uncomplicated: Secondary | ICD-10-CM | POA: Diagnosis not present

## 2015-12-27 LAB — CBC
HCT: 39.6 % (ref 36.0–46.0)
Hemoglobin: 12.9 g/dL (ref 12.0–15.0)
MCH: 29.4 pg (ref 26.0–34.0)
MCHC: 32.6 g/dL (ref 30.0–36.0)
MCV: 90.2 fL (ref 78.0–100.0)
Platelets: 270 10*3/uL (ref 150–400)
RBC: 4.39 MIL/uL (ref 3.87–5.11)
RDW: 13 % (ref 11.5–15.5)
WBC: 6.9 10*3/uL (ref 4.0–10.5)

## 2015-12-27 LAB — COMPREHENSIVE METABOLIC PANEL
ALBUMIN: 3.5 g/dL (ref 3.5–5.0)
ALT: 98 U/L — ABNORMAL HIGH (ref 14–54)
AST: 42 U/L — AB (ref 15–41)
Alkaline Phosphatase: 133 U/L — ABNORMAL HIGH (ref 38–126)
Anion gap: 10 (ref 5–15)
CALCIUM: 9 mg/dL (ref 8.9–10.3)
CO2: 24 mmol/L (ref 22–32)
Chloride: 107 mmol/L (ref 101–111)
Creatinine, Ser: 0.65 mg/dL (ref 0.44–1.00)
GFR calc Af Amer: >60 mL/min (ref >60)
GFR calc non Af Amer: >60 mL/min (ref >60)
GLUCOSE: 166 mg/dL — AB (ref 65–99)
Potassium: 3.6 mmol/L (ref 3.5–5.1)
SODIUM: 141 mmol/L (ref 135–145)
TOTAL PROTEIN: 7.4 g/dL (ref 6.5–8.1)
Total Bilirubin: 0.3 mg/dL (ref 0.3–1.2)

## 2015-12-27 LAB — URINALYSIS, ROUTINE W REFLEX MICROSCOPIC
BILIRUBIN URINE: NEGATIVE
Glucose, UA: NEGATIVE mg/dL
KETONES UR: NEGATIVE mg/dL
Leukocytes, UA: NEGATIVE
NITRITE: NEGATIVE
PH: 7.5 (ref 5.0–8.0)
Protein, ur: NEGATIVE mg/dL
Specific Gravity, Urine: 1.016 (ref 1.005–1.030)

## 2015-12-27 LAB — URINE MICROSCOPIC-ADD ON

## 2015-12-27 LAB — LIPASE, BLOOD: Lipase: 28 U/L (ref 11–51)

## 2015-12-27 MED ORDER — ONDANSETRON 4 MG PO TBDP: 4.0000 mg | ORAL_TABLET | Freq: Three times a day (TID) | ORAL | Status: DC | PRN

## 2015-12-27 MED ORDER — ALBUTEROL SULFATE HFA 108 (90 BASE) MCG/ACT IN AERS
2.0000 | INHALATION_SPRAY | RESPIRATORY_TRACT | Status: DC | PRN
  Administered 2015-12-27: 2 via RESPIRATORY_TRACT
  Filled 2015-12-27: qty 6.7

## 2015-12-27 MED ORDER — BENZONATATE 100 MG PO CAPS: 100.0000 mg | ORAL_CAPSULE | Freq: Three times a day (TID) | ORAL | Status: DC

## 2015-12-27 MED ORDER — ONDANSETRON 4 MG PO TBDP
4.0000 mg | ORAL_TABLET | Freq: Once | ORAL | Status: AC
  Administered 2015-12-27: 4 mg via ORAL
  Filled 2015-12-27: qty 1

## 2015-12-27 NOTE — ED Provider Notes (Signed)
CSN: [id number]     Arrival date & time 12/27/15  [id number] History   First MD Initiated Contact with Patient 12/27/15 1001     Chief Complaint  Patient presents with  . Cough  . Generalized Body Aches  . Sore Throat     (Consider location/radiation/quality/duration/timing/severity/associated sxs/prior Treatment) HPI Comments: Patient presents with multiple complaints. For the past 1 week she has had a sore throat which has been gradually not improving. However approximately makes 5 days ago she developed a cough productive of green mucus. She has also had a headache and generalized body aches. For the past 2 days she reports having nausea and vomiting with mild generalized abdominal pain. She has had occasional subjective fever. Over-the-counter medications have not helped. She just states that she feels generally bad.  Patient has had ongoing vaginal bleeding from fibroids. She is being followed for these and was told that she may need surgery. This is unchanged. No syncope.  Patient is a 35 y.o. female presenting with cough and pharyngitis. The history is provided by the patient.  Cough Associated symptoms: fever, headaches, myalgias, sore throat and wheezing   Associated symptoms: no chills, no ear pain, no rash, no rhinorrhea and no shortness of breath   Sore Throat Associated symptoms include abdominal pain, congestion, coughing, fatigue, a fever, headaches, myalgias, nausea, a sore throat and vomiting. Pertinent negatives include no chills or rash.    Past Medical History  Diagnosis Date  . Irritable bowel   . Asthma     meds as needed  . Ectopic pregnancy     requiring surgery  . Gonorrhea   . Preterm labor   . Arthritis   . Chronic back pain   . Migraine headache    Past Surgical History  Procedure Laterality Date  . Wisdom tooth extraction    . Tubal ligation  2005  . Laparoscopy  05/30/2012    Procedure: LAPAROSCOPY OPERATIVE;  Surgeon: Osborne Oman, MD;  Location: [facility];  Service: Gynecology;  Laterality: N/A;  Operative laparoscopy right salpingectomy and removal of ectopic pregnancy  . Salpingectomy    . Laparoscopy for ectopic pregnancy     Family History  Problem Relation Age of Onset  . Asthma Brother    Social History  Substance Use Topics  . Smoking status: Current Every Day Smoker -- 0.25 packs/day    Types: Cigarettes  . Smokeless tobacco: Never Used  . Alcohol Use: Yes     Comment: occasional   OB History    Gravida Para Term Preterm AB TAB SAB Ectopic Multiple Living   6 3 2 1 2  1 1  3      Review of Systems  Constitutional: Positive for fever and fatigue. Negative for chills.  HENT: Positive for congestion and sore throat. Negative for ear pain, rhinorrhea and sinus pressure.   Eyes: Negative for redness.  Respiratory: Positive for cough and wheezing. Negative for shortness of breath.   Gastrointestinal: Positive for nausea, vomiting, abdominal pain and diarrhea.  Genitourinary: Positive for vaginal bleeding (ongoing). Negative for dysuria.  Musculoskeletal: Positive for myalgias. Negative for neck stiffness.  Skin: Negative for rash.  Neurological: Positive for headaches.  Hematological: Negative for adenopathy.      Allergies  Hydrocodeine; Latex; Raspberry; and Vioxx  Home Medications   Prior to Admission medications   Medication Sig Start Date End Date Taking? Authorizing Provider  albuterol (PROVENTIL HFA;VENTOLIN HFA) 108 (90 BASE) MCG/ACT inhaler Inhale 1-2 puffs into  the lungs every 6 (six) hours as needed for wheezing or shortness of breath. 03/21/15  Yes Tiffany Carlota Raspberry, PA-C  albuterol (PROVENTIL) (2.5 MG/3ML) 0.083% nebulizer solution Take 3 mLs (2.5 mg total) by nebulization every 6 (six) hours as needed for wheezing or shortness of breath. 03/24/15  Yes Robbie Lis, MD  Ca Carbonate-Mag Hydroxide (ROLAIDS PO) Take 2 tablets by mouth 3 (three) times daily as needed (For heartburn.).   Yes Historical Provider,  MD  gabapentin (NEURONTIN) 300 MG capsule Take 300 mg by mouth See admin instructions. Take 1 capsule (300 mg) by mouth on the nights that you are home too late to take nortriptyline (take with nortriptyline on Friday and Saturday nights)   Yes Historical Provider, MD  ibuprofen (ADVIL,MOTRIN) 800 MG tablet Take 1 tablet (800 mg total) by mouth every 8 (eight) hours as needed. Patient taking differently: Take 800 mg by mouth every 8 (eight) hours as needed for moderate pain.  11/09/15  Yes Tresea Mall, CNM  Multiple Vitamin (MULTIVITAMIN WITH MINERALS) TABS tablet Take 1 tablet by mouth daily.   Yes Historical Provider, MD  doxycycline (VIBRAMYCIN) 100 MG capsule Take 1 capsule (100 mg total) by mouth 2 (two) times daily. Patient not taking: Reported on 12/07/2015 11/18/15   Ashley Murrain, NP  fluconazole (DIFLUCAN) 150 MG tablet Take 1 tablet (150 mg total) by mouth once. Patient not taking: Reported on 12/27/2015 12/07/15   Mora Bellman, MD  metroNIDAZOLE (FLAGYL) 500 MG tablet Take two tablets by mouth twice a day, for one day.  Or you can take all four tablets at once if you can tolerate it. Patient not taking: Reported on 12/07/2015 11/17/15   Caren Macadam, MD  norgestimate-ethinyl estradiol (ORTHO-CYCLEN,SPRINTEC,PREVIFEM) 0.25-35 MG-MCG tablet Take 1 tablet by mouth daily. 12/07/15   Mora Bellman, MD  norgestimate-ethinyl estradiol (ORTHO-CYCLEN,SPRINTEC,PREVIFEM) 0.25-35 MG-MCG tablet Take 3 tabs per day for 3 days, 2 pills a day for 3 days, and 1 pill a day skipping the active pills 12/07/15   Peggy Constant, MD  nortriptyline (PAMELOR) 10 MG capsule Take 20 mg by mouth See admin instructions. Take 2 capsules (20 mg) by mouth every night that you are home before 8pm    Historical Provider, MD  nystatin cream (MYCOSTATIN) Apply to affected area 2 times daily Patient taking differently: Apply 1 application topically 2 (two) times daily. Apply to affected area 2 times daily  11/02/15   Luvenia Redden, PA-C  oxyCODONE-acetaminophen (PERCOCET/ROXICET) 5-325 MG tablet Take 1 tablet by mouth every 6 (six) hours as needed for severe pain. 10/27/15   Luvenia Redden, PA-C  propranolol (INDERAL) 60 MG tablet Take 1 tablet (60 mg total) by mouth 2 (two) times daily. Patient taking differently: Take 60 mg by mouth 2 (two) times daily as needed (For migraine).  05/09/14   Marcial Pacas, MD   BP 156/82 mmHg  Pulse 65  Temp(Src) 98.1 F (36.7 C) (Oral)  SpO2 100%   Physical Exam  Constitutional: She appears well-developed and well-nourished.  HENT:  Head: Normocephalic and atraumatic.  Right Ear: Tympanic membrane, external ear and ear canal normal.  Left Ear: Tympanic membrane, external ear and ear canal normal.  Nose: Nose normal. No mucosal edema or rhinorrhea.  Mouth/Throat: Uvula is midline, oropharynx is clear and moist and mucous membranes are normal. Mucous membranes are not dry. No oral lesions. No trismus in the jaw. No uvula swelling. No oropharyngeal exudate, posterior oropharyngeal edema, posterior oropharyngeal erythema or  tonsillar abscesses.  Eyes: Conjunctivae are normal. Right eye exhibits no discharge. Left eye exhibits no discharge.  Neck: Normal range of motion. Neck supple.  Cardiovascular: Normal rate, regular rhythm and normal heart sounds.   No murmur heard. Pulmonary/Chest: Effort normal and breath sounds normal. No respiratory distress. She has no wheezes. She has no rales.  Abdominal: Soft. There is tenderness (mild generalized).  Lymphadenopathy:    She has no cervical adenopathy.  Neurological: She is alert.  Skin: Skin is warm and dry.  Psychiatric: She has a normal mood and affect.  Nursing note and vitals reviewed.   ED Course  Procedures (including critical care time) Labs Review Labs Reviewed  COMPREHENSIVE METABOLIC PANEL - Abnormal; Notable for the following:    Glucose, Bld 166 (*)    BUN <5 (*)    AST 42 (*)    ALT 98 (*)     Alkaline Phosphatase 133 (*)    All other components within normal limits  LIPASE, BLOOD  CBC    Imaging Review No results found. I have personally reviewed and evaluated these images and lab results as part of my medical decision-making.   EKG Interpretation None       10:36 AM Patient seen and examined. Work-up initiated. Medications ordered. Patient informed of elevated liver enzymes and need to have these rechecked. Patient verbalizes understanding.    Vital signs reviewed and are as follows: BP 156/82 mmHg  Pulse 65  Temp(Src) 98.1 F (36.7 C) (Oral)  SpO2 100%  1:03 PM patient updated on results. Chest x-ray is negative. Will discharge to home with albuterol inhaler, Zofran. Once again, reiterated need to follow-up with PCP to discuss elevated liver enzymes.  Patient counseled on supportive care for viral URI and s/s to return including worsening symptoms, persistent fever, persistent vomiting, or if they have any other concerns. Urged to see PCP if symptoms persist for more than 3 days. Patient verbalizes understanding and agrees with plan.   MDM   Final diagnoses:  Bronchitis   Patient with symptoms most consistent with that of a viral syndrome. She does have mild abdominal pain however reassuring exam with normal white blood cell count. Do not suspect significant intra-abdominal etiology. Chest x-ray is negative for pneumonia. No indications for antibiotics at this time. Patient to follow-up with PCP. Supportive symptoms as above.   Carlisle Cater, PA-C 12/27/15 [place], MD 12/28/15 [phone]

## 2015-12-27 NOTE — ED Notes (Signed)
Pt states that she has been having productive cough with green sputum, sore throat, headache, chills, gen body aches since 12/28.  Has been having vaginal bleeding x 2 months.  States she has been vomiting also.

## 2015-12-27 NOTE — Discharge Instructions (Signed)
Please read and follow all provided instructions.  Your diagnoses today include:  1. Bronchitis     Tests performed today include:  Chest x-ray - does not show any pneumonia  Vital signs. See below for your results today.   Medications prescribed:   Zofran (ondansetron) - for nausea and vomiting   Tessalon Perles - cough suppressant medication  Take any prescribed medications only as directed.  Home care instructions:  Follow any educational materials contained in this packet.  Follow-up instructions: Please follow-up with your primary care provider in the next 3 days for further evaluation of your symptoms and a recheck if you are not feeling better.   Return instructions:   Please return to the Emergency Department if you experience worsening symptoms.  Please return with worsening wheezing, shortness of breath, or difficulty breathing.  Return with persistent fever above 101F.   Please return if you have any other emergent concerns.  Additional Information:  Your vital signs today were: BP 130/88 mmHg   Pulse 64   Temp(Src) 98.6 F (37 C) (Oral)   Resp 20   SpO2 100%   LMP  (Approximate) If your blood pressure (BP) was elevated above 135/85 this visit, please have this repeated by your doctor within one month. --------------

## 2016-01-13 ENCOUNTER — Emergency Department (HOSPITAL_COMMUNITY)
Admission: EM | Admit: 2016-01-13 | Discharge: 2016-01-14 | Disposition: A | Discharge status: Home / Self Care | Payer: Medicaid Other | Attending: Emergency Medicine | Admitting: Emergency Medicine

## 2016-01-13 ENCOUNTER — Encounter (HOSPITAL_COMMUNITY): Payer: Self-pay | Admitting: Emergency Medicine

## 2016-01-13 ENCOUNTER — Emergency Department (HOSPITAL_COMMUNITY): Payer: Medicaid Other

## 2016-01-13 DIAGNOSIS — Z8619 Personal history of other infectious and parasitic diseases: Secondary | ICD-10-CM | POA: Diagnosis not present

## 2016-01-13 DIAGNOSIS — Z91040 Latex allergy status: Secondary | ICD-10-CM | POA: Insufficient documentation

## 2016-01-13 DIAGNOSIS — Z79899 Other long term (current) drug therapy: Secondary | ICD-10-CM | POA: Diagnosis not present

## 2016-01-13 DIAGNOSIS — M1990 Unspecified osteoarthritis, unspecified site: Secondary | ICD-10-CM | POA: Diagnosis not present

## 2016-01-13 DIAGNOSIS — Z8751 Personal history of pre-term labor: Secondary | ICD-10-CM | POA: Insufficient documentation

## 2016-01-13 DIAGNOSIS — G8929 Other chronic pain: Secondary | ICD-10-CM | POA: Insufficient documentation

## 2016-01-13 DIAGNOSIS — Z3202 Encounter for pregnancy test, result negative: Secondary | ICD-10-CM | POA: Diagnosis not present

## 2016-01-13 DIAGNOSIS — F17210 Nicotine dependence, cigarettes, uncomplicated: Secondary | ICD-10-CM | POA: Insufficient documentation

## 2016-01-13 DIAGNOSIS — K589 Irritable bowel syndrome without diarrhea: Secondary | ICD-10-CM | POA: Diagnosis not present

## 2016-01-13 DIAGNOSIS — R1031 Right lower quadrant pain: Secondary | ICD-10-CM | POA: Diagnosis present

## 2016-01-13 DIAGNOSIS — J45909 Unspecified asthma, uncomplicated: Secondary | ICD-10-CM | POA: Diagnosis not present

## 2016-01-13 DIAGNOSIS — G43909 Migraine, unspecified, not intractable, without status migrainosus: Secondary | ICD-10-CM | POA: Diagnosis not present

## 2016-01-13 DIAGNOSIS — D259 Leiomyoma of uterus, unspecified: Secondary | ICD-10-CM | POA: Insufficient documentation

## 2016-01-13 LAB — COMPREHENSIVE METABOLIC PANEL
ALK PHOS: 109 U/L (ref 38–126)
ALT: 34 U/L (ref 14–54)
AST: 19 U/L (ref 15–41)
Albumin: 3.6 g/dL (ref 3.5–5.0)
Anion gap: 8 (ref 5–15)
BUN: 8 mg/dL (ref 6–20)
CHLORIDE: 112 mmol/L — AB (ref 101–111)
CO2: 25 mmol/L (ref 22–32)
CREATININE: 0.76 mg/dL (ref 0.44–1.00)
Calcium: 9.2 mg/dL (ref 8.9–10.3)
GFR calc Af Amer: >60 mL/min (ref >60)
Glucose, Bld: 99 mg/dL (ref 65–99)
Potassium: 4 mmol/L (ref 3.5–5.1)
SODIUM: 145 mmol/L (ref 135–145)
Total Bilirubin: 0.2 mg/dL — ABNORMAL LOW (ref 0.3–1.2)
Total Protein: 7.1 g/dL (ref 6.5–8.1)

## 2016-01-13 LAB — CBC
HCT: 37.2 % (ref 36.0–46.0)
Hemoglobin: 12.1 g/dL (ref 12.0–15.0)
MCH: 29.9 pg (ref 26.0–34.0)
MCHC: 32.5 g/dL (ref 30.0–36.0)
MCV: 91.9 fL (ref 78.0–100.0)
PLATELETS: 273 10*3/uL (ref 150–400)
RBC: 4.05 MIL/uL (ref 3.87–5.11)
RDW: 13.2 % (ref 11.5–15.5)
WBC: 8.7 10*3/uL (ref 4.0–10.5)

## 2016-01-13 LAB — LIPASE, BLOOD: LIPASE: 53 U/L — AB (ref 11–51)

## 2016-01-13 LAB — I-STAT BETA HCG BLOOD, ED (MC, WL, AP ONLY): I-stat hCG, quantitative: <5 m[IU]/mL (ref <5)

## 2016-01-13 MED ORDER — SODIUM CHLORIDE 0.9 % IV SOLN
INTRAVENOUS | Status: DC
  Administered 2016-01-13: via INTRAVENOUS

## 2016-01-13 MED ORDER — IOHEXOL 300 MG/ML  SOLN
50.0000 mL | Freq: Once | INTRAMUSCULAR | Status: AC | PRN
  Administered 2016-01-13: 50 mL via ORAL

## 2016-01-13 MED ORDER — ONDANSETRON HCL 4 MG/2ML IJ SOLN
4.0000 mg | Freq: Once | INTRAMUSCULAR | Status: AC
  Administered 2016-01-13: 4 mg via INTRAVENOUS
  Filled 2016-01-13: qty 2

## 2016-01-13 MED ORDER — MORPHINE SULFATE (PF) 4 MG/ML IV SOLN
4.0000 mg | Freq: Once | INTRAVENOUS | Status: AC
  Administered 2016-01-13: 4 mg via INTRAVENOUS
  Filled 2016-01-13: qty 1

## 2016-01-13 MED ORDER — IOHEXOL 300 MG/ML  SOLN
100.0000 mL | Freq: Once | INTRAMUSCULAR | Status: AC | PRN
  Administered 2016-01-14: 100 mL via INTRAVENOUS

## 2016-01-13 MED ORDER — DIPHENHYDRAMINE HCL 50 MG/ML IJ SOLN
25.0000 mg | Freq: Once | INTRAMUSCULAR | Status: AC
  Administered 2016-01-13: 25 mg via INTRAVENOUS
  Filled 2016-01-13: qty 1

## 2016-01-13 NOTE — ED Provider Notes (Signed)
CSN: [id number]     Arrival date & time 01/13/16  1844 History   By signing my name below, I, Forrestine Him, attest that this documentation has been prepared under the direction and in the presence of Lacretia Leigh, MD.  Electronically Signed: Forrestine Him, ED Scribe. 01/13/2016. 11:29 PM.   Chief Complaint  Patient presents with  . Abdominal Pain   The history is provided by the patient. No language interpreter was used.    HPI Comments: [name] is a 35 y.o. female with a PMHx of IBS who presents to the Emergency Department complaining of intermittent, ongoing, RLQ abdominal pain x 4 days while resting in bed; constant in last 2 days. Pt described pain as contractions. She states pain is exacerbated when she "stretches out". No worsening pain with eating. No alleviating factors at this time. She admits to nausea and vomiting but not in the last 24 hours. OTC Ibuprofen attempted at home with mild temporary improvement. No recent fever, chills, myalgias, nausea, vomiting, or diarrhea. She denies any recent urinary symptoms. No recent heavy lifting.  [name] also mentions ongoing vaginal bleeding since having an IUD placed in November of 2016.  PCP: Elyn Peers, MD    Past Medical History  Diagnosis Date  . Irritable bowel   . Asthma     meds as needed  . Ectopic pregnancy     requiring surgery  . Gonorrhea   . Preterm labor   . Arthritis   . Chronic back pain   . Migraine headache    Past Surgical History  Procedure Laterality Date  . Wisdom tooth extraction    . Tubal ligation  2005  . Laparoscopy  05/30/2012    Procedure: LAPAROSCOPY OPERATIVE;  Surgeon: Osborne Oman, MD;  Location: [facility];  Service: Gynecology;  Laterality: N/A;  Operative laparoscopy right salpingectomy and removal of ectopic pregnancy  . Salpingectomy    . Laparoscopy for ectopic pregnancy     Family History  Problem Relation Age of Onset  . Asthma Brother    Social History   Substance Use Topics  . Smoking status: Current Every Day Smoker -- 0.25 packs/day    Types: Cigarettes  . Smokeless tobacco: Never Used  . Alcohol Use: Yes     Comment: occasional   OB History    Gravida Para Term Preterm AB TAB SAB Ectopic Multiple Living   6 3 2 1 2  1 1  3      Review of Systems  Constitutional: Negative for fever and chills.  Gastrointestinal: Positive for abdominal pain. Negative for nausea, vomiting and diarrhea.  Genitourinary: Positive for vaginal bleeding.  Psychiatric/Behavioral: Negative for confusion.  All other systems reviewed and are negative.     Allergies  Hydrocodeine; Latex; Raspberry; and Vioxx  Home Medications   Prior to Admission medications   Medication Sig Start Date End Date Taking? Authorizing Provider  albuterol (PROVENTIL HFA;VENTOLIN HFA) 108 (90 BASE) MCG/ACT inhaler Inhale 1-2 puffs into the lungs every 6 (six) hours as needed for wheezing or shortness of breath. 03/21/15   Delos Haring, PA-C  albuterol (PROVENTIL) (2.5 MG/3ML) 0.083% nebulizer solution Take 3 mLs (2.5 mg total) by nebulization every 6 (six) hours as needed for wheezing or shortness of breath. 03/24/15   Robbie Lis, MD  benzonatate (TESSALON) 100 MG capsule Take 1 capsule (100 mg total) by mouth every 8 (eight) hours. 12/27/15   Carlisle Cater, PA-C  Ca Carbonate-Mag Hydroxide (ROLAIDS  PO) Take 2 tablets by mouth 3 (three) times daily as needed (For heartburn.).    Historical Provider, MD  gabapentin (NEURONTIN) 300 MG capsule Take 300 mg by mouth See admin instructions. Take 1 capsule (300 mg) by mouth on the nights that you are home too late to take nortriptyline (take with nortriptyline on Friday and Saturday nights)    Historical Provider, MD  ibuprofen (ADVIL,MOTRIN) 800 MG tablet Take 1 tablet (800 mg total) by mouth every 8 (eight) hours as needed. Patient taking differently: Take 800 mg by mouth every 8 (eight) hours as needed for moderate pain.  11/09/15    Tresea Mall, CNM  Multiple Vitamin (MULTIVITAMIN WITH MINERALS) TABS tablet Take 1 tablet by mouth daily.    Historical Provider, MD  norgestimate-ethinyl estradiol (ORTHO-CYCLEN,SPRINTEC,PREVIFEM) 0.25-35 MG-MCG tablet Take 1 tablet by mouth daily. Patient not taking: Reported on 12/27/2015 12/07/15   Mora Bellman, MD  norgestimate-ethinyl estradiol (ORTHO-CYCLEN,SPRINTEC,PREVIFEM) 0.25-35 MG-MCG tablet Take 3 tabs per day for 3 days, 2 pills a day for 3 days, and 1 pill a day skipping the active pills 12/07/15   Peggy Constant, MD  nortriptyline (PAMELOR) 10 MG capsule Take 20 mg by mouth See admin instructions. Take 2 capsules (20 mg) by mouth every night that you are home before 8pm    Historical Provider, MD  nystatin cream (MYCOSTATIN) Apply to affected area 2 times daily Patient taking differently: Apply 1 application topically 2 (two) times daily.  11/02/15   Luvenia Redden, PA-C  ondansetron (ZOFRAN ODT) 4 MG disintegrating tablet Take 1 tablet (4 mg total) by mouth every 8 (eight) hours as needed for nausea or vomiting. 12/27/15   Carlisle Cater, PA-C  propranolol (INDERAL) 60 MG tablet Take 1 tablet (60 mg total) by mouth 2 (two) times daily. Patient taking differently: Take 60 mg by mouth 2 (two) times daily as needed (For migraine).  05/09/14   Marcial Pacas, MD   Triage Vitals: BP 142/96 mmHg  Pulse 79  Temp(Src) 98.1 F (36.7 C) (Oral)  Resp 18  Ht 5\' 4"  (1.626 m)  Wt 196 lb (88.905 kg)  BMI 33.63 kg/m2  SpO2 99%  LMP  (Approximate)   Physical Exam  Constitutional: She is oriented to person, place, and time. She appears well-developed and well-nourished.  Non-toxic appearance. No distress.  HENT:  Head: Normocephalic and atraumatic.  Eyes: Conjunctivae, EOM and lids are normal. Pupils are equal, round, and reactive to light.  Neck: Normal range of motion. Neck supple. No tracheal deviation present. No thyroid mass present.  Cardiovascular: Normal rate, regular rhythm and  normal heart sounds.  Exam reveals no gallop.   No murmur heard. Pulmonary/Chest: Effort normal and breath sounds normal. No stridor. No respiratory distress. She has no decreased breath sounds. She has no wheezes. She has no rhonchi. She has no rales.  Abdominal: Soft. Normal appearance and bowel sounds are normal. She exhibits no distension. There is tenderness. There is no rebound, no guarding and no CVA tenderness.  Mild tenderness to RLQ  Musculoskeletal: Normal range of motion. She exhibits no edema or tenderness.  Neurological: She is alert and oriented to person, place, and time. She has normal strength. No cranial nerve deficit or sensory deficit. GCS eye subscore is 4. GCS verbal subscore is 5. GCS motor subscore is 6.  Skin: Skin is warm and dry. No abrasion and no rash noted.  Psychiatric: She has a normal mood and affect. Her speech is normal and behavior  is normal.  Nursing note and vitals reviewed.   ED Course  Procedures (including critical care time)  DIAGNOSTIC STUDIES: Oxygen Saturation is 99% on RA, Normal by my interpretation.    COORDINATION OF CARE: 11:17 PM-Will give fluids, Zofran, Morphine, Benadryl, and Omnipaque. Will order CT abdoman Pelvise with contrast, i-stat beta hCG, CMP, CBC, urinalysis, and pregnancy urine. Discussed treatment plan with pt at bedside and pt agreed to plan.     Labs Review Labs Reviewed  LIPASE, BLOOD - Abnormal; Notable for the following:    Lipase 53 (*)    All other components within normal limits  COMPREHENSIVE METABOLIC PANEL - Abnormal; Notable for the following:    Chloride 112 (*)    Total Bilirubin 0.2 (*)    All other components within normal limits  URINALYSIS, ROUTINE W REFLEX MICROSCOPIC (NOT AT [facility]) - Abnormal; Notable for the following:    APPearance CLOUDY (*)    Specific Gravity, Urine 1.035 (*)    Hgb urine dipstick LARGE (*)    Bilirubin Urine SMALL (*)    All other components within normal limits  URINE  MICROSCOPIC-ADD ON - Abnormal; Notable for the following:    Squamous Epithelial / LPF TOO NUMEROUS TO COUNT (*)    Bacteria, UA RARE (*)    All other components within normal limits  CBC  POC URINE PREG, ED  I-STAT BETA HCG BLOOD, ED ([facility], [facility], [facility] ONLY)    Imaging Review Ct Abdomen Pelvis W Contrast  01/14/2016  CLINICAL DATA:  35 year old female with right lower quadrant abdominal pain, nausea and vomiting. EXAM: CT ABDOMEN AND PELVIS WITH CONTRAST TECHNIQUE: Multidetector CT imaging of the abdomen and pelvis was performed using the standard protocol following bolus administration of intravenous contrast. CONTRAST:  82mL OMNIPAQUE IOHEXOL 300 MG/ML SOLN, 112mL OMNIPAQUE IOHEXOL 300 MG/ML SOLN COMPARISON:  Ultrasound dated 11/02/2015 FINDINGS: The lung bases are clear. No intra-abdominal free air or free fluid. The liver, gallbladder, pancreas, spleen, adrenal glands, kidneys, visualized ureters appear unremarkable. The urinary bladder is collapsed. An intrauterine device is noted. A 1.4 x 2.5 cm heterogeneous structure in the left uterine fundus likely represents a fibroid. This lesion demonstrates central hypoattenuation which may represent degenerating changes. Areas of peripheral density may represent solid portion of the fibroid. An intralesional hemorrhage is less likely. Ultrasound may provide better evaluation. An intrauterine device is noted. The ovaries appear unremarkable. There is no evidence of bowel obstruction or inflammation. Normal appendix. The abdominal aorta and IVC appear unremarkable. No portal venous gas identified. There is no adenopathy. Small fat containing umbilical hernia. The osseous structures appear unremarkable. IMPRESSION: Left uterine body lesion, likely corresponding to the fibroid seen on the prior ultrasound. No other acute intra-abdominal or pelvic pathology. Electronically Signed   By: Anner Crete M.D.   On: 01/14/2016 00:19   I have personally reviewed and  evaluated these images and lab results as part of my medical decision-making.   EKG Interpretation None      MDM   Final diagnoses:  None    I personally performed the services described in this documentation, which was scribed in my presence. The recorded information has been reviewed and is accurate.   Patient given pain meds and feels better here. CT results reviewed with her and she will follow-up with her gynecologist  Lacretia Leigh, MD 01/14/16 [phone]

## 2016-01-13 NOTE — ED Notes (Signed)
Pt c/o RLQ pain which radiates to R side of back x 4 days. Pt states today she developed hot flashes and dizziness with N/V/D. Pt states she has had continuous vaginal bleeding since having IUD placed in November. She also is taking oral contraceptive. States she has a migraine headache also. She hasn't taken anything for pain. Rates abdominal pain as 10/10. Pt alert, no acute distress. Skin warm, dry.

## 2016-01-13 NOTE — ED Notes (Signed)
Bed: WA06 Expected date:  Expected time:  Means of arrival:  Comments: 

## 2016-01-14 LAB — URINALYSIS, ROUTINE W REFLEX MICROSCOPIC
Glucose, UA: NEGATIVE mg/dL
Ketones, ur: NEGATIVE mg/dL
LEUKOCYTES UA: NEGATIVE
Nitrite: NEGATIVE
Protein, ur: NEGATIVE mg/dL
SPECIFIC GRAVITY, URINE: 1.035 — AB (ref 1.005–1.030)
pH: 6 (ref 5.0–8.0)

## 2016-01-14 LAB — URINE MICROSCOPIC-ADD ON

## 2016-01-14 MED ORDER — IBUPROFEN 600 MG PO TABS: 600.0000 mg | ORAL_TABLET | Freq: Four times a day (QID) | ORAL | Status: DC | PRN

## 2016-01-14 MED ORDER — METHOCARBAMOL 750 MG PO TABS: 750.0000 mg | ORAL_TABLET | Freq: Four times a day (QID) | ORAL | Status: DC

## 2016-01-14 NOTE — Discharge Instructions (Signed)
Uterine Fibroids Uterine fibroids are tissue masses (tumors) that can develop in the womb (uterus). They are also called leiomyomas. This type of tumor is not cancerous (benign) and does not spread to other parts of the body outside of the pelvic area, which is between the hip bones. Occasionally, fibroids may develop in the fallopian tubes, in the cervix, or on the support structures (ligaments) that surround the uterus. You can have one or many fibroids. Fibroids can vary in size, weight, and where they grow in the uterus. Some can become quite large. Most fibroids do not require medical treatment. CAUSES A fibroid can develop when a single uterine cell keeps growing (replicating). Most cells in the human body have a control mechanism that keeps them from replicating without control. SIGNS AND SYMPTOMS Symptoms may include:   Heavy bleeding during your period.  Bleeding or spotting between periods.  Pelvic pain and pressure.  Bladder problems, such as needing to urinate more often (urinary frequency) or urgently.  Inability to reproduce offspring (infertility).  Miscarriages. DIAGNOSIS Uterine fibroids are diagnosed through a physical exam. Your health care provider may feel the lumpy tumors during a pelvic exam. Ultrasonography and an MRI may be done to determine the size, location, and number of fibroids. TREATMENT Treatment may include:  Watchful waiting. This involves getting the fibroid checked by your health care provider to see if it grows or shrinks. Follow your health care provider's recommendations for how often to have this checked.  Hormone medicines. These can be taken by mouth or given through an intrauterine device (IUD).  Surgery.  Removing the fibroids (myomectomy) or the uterus (hysterectomy).  Removing blood supply to the fibroids (uterine artery embolization). If fibroids interfere with your fertility and you want to become pregnant, your health care provider  may recommend having the fibroids removed.  HOME CARE INSTRUCTIONS  Keep all follow-up visits as directed by your health care provider. This is important.  Take medicines only as directed by your health care provider.  If you were prescribed a hormone treatment, take the hormone medicines exactly as directed.  Do not take aspirin, because it can cause bleeding.  Ask your health care provider about taking iron pills and increasing the amount of dark green, leafy vegetables in your diet. These actions can help to boost your blood iron levels, which may be affected by heavy menstrual bleeding.  Pay close attention to your period and tell your health care provider about any changes, such as:  Increased blood flow that requires you to use more pads or tampons than usual per month.  A change in the number of days that your period lasts per month.  A change in symptoms that are associated with your period, such as abdominal cramping or back pain. SEEK MEDICAL CARE IF:  You have pelvic pain, back pain, or abdominal cramps that cannot be controlled with medicines.  You have an increase in bleeding between and during periods.  You soak tampons or pads in a half hour or less.  You feel lightheaded, extra tired, or weak. SEEK IMMEDIATE MEDICAL CARE IF:  You faint.  You have a sudden increase in pelvic pain.   This information is not intended to replace advice given to you by your health care provider. Make sure you discuss any questions you have with your health care provider.   Document Released: 12/06/2000 Document Revised: 12/30/2014 Document Reviewed: 06/07/2014 Elsevier Interactive Patient Education 2016 Elsevier Inc[employer]  

## 2016-01-23 ENCOUNTER — Other Ambulatory Visit: Payer: Self-pay | Admitting: Obstetrics and Gynecology

## 2016-02-08 ENCOUNTER — Ambulatory Visit (INDEPENDENT_AMBULATORY_CARE_PROVIDER_SITE_OTHER): Payer: Medicaid Other | Admitting: Obstetrics & Gynecology

## 2016-02-08 ENCOUNTER — Encounter: Payer: Self-pay | Admitting: Obstetrics & Gynecology

## 2016-02-08 VITALS — BP 157/94 | HR 75 | Temp 97.8°F | Wt 194.3 lb

## 2016-02-08 DIAGNOSIS — N939 Abnormal uterine and vaginal bleeding, unspecified: Secondary | ICD-10-CM

## 2016-02-08 DIAGNOSIS — D250 Submucous leiomyoma of uterus: Secondary | ICD-10-CM

## 2016-02-08 NOTE — Progress Notes (Signed)
Patient ID: [name], female   DOB: June 01, 1981, 35 y.o.   MRN: [id number] History:  35 y.o. [id number] here today for AUB. Pt c/o bleeding not controlled with OCPs or IUD.  Pt requests definitive management. She is frustrated and wants to consider an ablation.    The following portions of the patient's history were reviewed and updated as appropriate: allergies, current medications, past family history, past medical history, past social history, past surgical history and problem list.  Review of Systems:  Pertinent items are noted in HPI.  Objective:  Physical Exam Blood pressure 157/94, pulse 75, temperature 97.8 F (36.6 C), temperature source Oral, weight 194 lb 4.8 oz (88.134 kg). Gen: NAD Abd: Soft, nontender and nondistended Pelvic: deferred  11/102016 CLINICAL DATA: Patient with abnormal uterine bleeding.  EXAM: TRANSABDOMINAL AND TRANSVAGINAL ULTRASOUND OF PELVIS  TECHNIQUE: Both transabdominal and transvaginal ultrasound examinations of the pelvis were performed. Transabdominal technique was performed for global imaging of the pelvis including uterus, ovaries, adnexal regions, and pelvic cul-de-sac. It was necessary to proceed with endovaginal exam following the transabdominal exam to visualize the endometrium.  COMPARISON: None  FINDINGS: Uterus  Measurements: 8.6 x 4.6 x 6.0 cm. Within the anterior left uterine body there is a 2.4 x 2.1 x 1.9 cm intramural fibroid.  Endometrium  Thickness: 17 mm. No focal abnormality visualized.  Right ovary  Measurements: 3.3 x 1.8 x 2.1 cm. Normal appearance/no adnexal mass.  Left ovary  Measurements: 2.8 x 1.9 x 2.3 cm. Normal appearance/no adnexal mass.  Other findings  No free fluid.  IMPRESSION: Fibroid uterus.  Endometrium measures 17 mm. If bleeding remains unresponsive to hormonal or medical therapy, focal lesion work-up with sonohysterogram should be considered. Endometrial  biopsy should also be considered in pre-menopausal patients at high risk for endometrial carcinoma. (Ref: Radiological Reasoning: Algorithmic Workup of Abnormal Vaginal Bleeding with Endovaginal Sonography and Sonohysterography. [id number])   Labs and Imaging Ct Abdomen Pelvis W Contrast  01/14/2016  CLINICAL DATA:  35 year old female with right lower quadrant abdominal pain, nausea and vomiting. EXAM: CT ABDOMEN AND PELVIS WITH CONTRAST TECHNIQUE: Multidetector CT imaging of the abdomen and pelvis was performed using the standard protocol following bolus administration of intravenous contrast. CONTRAST:  51mL OMNIPAQUE IOHEXOL 300 MG/ML SOLN, 120mL OMNIPAQUE IOHEXOL 300 MG/ML SOLN COMPARISON:  Ultrasound dated 11/02/2015 FINDINGS: The lung bases are clear. No intra-abdominal free air or free fluid. The liver, gallbladder, pancreas, spleen, adrenal glands, kidneys, visualized ureters appear unremarkable. The urinary bladder is collapsed. An intrauterine device is noted. A 1.4 x 2.5 cm heterogeneous structure in the left uterine fundus likely represents a fibroid. This lesion demonstrates central hypoattenuation which may represent degenerating changes. Areas of peripheral density may represent solid portion of the fibroid. An intralesional hemorrhage is less likely. Ultrasound may provide better evaluation. An intrauterine device is noted. The ovaries appear unremarkable. There is no evidence of bowel obstruction or inflammation. Normal appendix. The abdominal aorta and IVC appear unremarkable. No portal venous gas identified. There is no adenopathy. Small fat containing umbilical hernia. The osseous structures appear unremarkable. IMPRESSION: Left uterine body lesion, likely corresponding to the fibroid seen on the prior ultrasound. No other acute intra-abdominal or pelvic pathology. Electronically Signed   By: Anner Crete M.D.   On: 01/14/2016 00:19    Assessment & Plan:  Patient  desires surgical management with hysteroscopy with endometrial ablation and possible resection of fibroid.  The risks of surgery were discussed in detail with  the patient including but not limited to: bleeding which may require transfusion or reoperation; infection which may require prolonged hospitalization or re-hospitalization and antibiotic therapy; injury to bowel, bladder, ureters and major vessels or other surrounding organs; need for additional procedures including laparotomy; thromboembolic phenomenon, incisional problems and other postoperative or anesthesia complications.  Patient was told that the likelihood that her condition and symptoms will be treated effectively with this surgical management was very high; the postoperative expectations were also discussed in detail. The patient also understands the alternative treatment options which were discussed in full. All questions were answered.  She was told that she will be contacted by our surgical scheduler regarding the time and date of her surgery; routine preoperative instructions of having nothing to eat or drink after midnight on the day prior to surgery and also coming to the hospital 1 1/2 hours prior to her time of surgery were also emphasized.  She was told she may be called for a preoperative appointment about a week prior to surgery and will be given further preoperative instructions at that visit. Printed patient education handouts about the procedure were given to the patient to review at home.  [name], M.D., [name]

## 2016-02-08 NOTE — Patient Instructions (Signed)
Endometrial Ablation °Endometrial ablation removes the lining of the uterus (endometrium). It is usually a same-day, outpatient treatment. Ablation helps avoid major surgery, such as surgery to remove the cervix and uterus (hysterectomy). After endometrial ablation, you will have little or no menstrual bleeding and may not be able to have children. However, if you are premenopausal, you will need to use a reliable method of birth control following the procedure because of the small chance that pregnancy can occur. °There are different reasons to have this procedure. These reasons include: °· Heavy periods. °· Bleeding that is causing anemia. °· Irregular bleeding. °· Bleeding fibroids on the lining inside the uterus if they are smaller than 3 centimeters. °This procedure may not be possible for you if:  °· You want to have children in the future.   °· You have severe cramps with your menstrual period.   °· You have precancerous or cancerous cells in your uterus.   °· You were recently pregnant.   °· You have gone through menopause.   °· You have had major surgery on your uterus, resulting in thinning of the uterine wall. Surgeries may include: °¨ The removal of one or more uterine fibroids (myomectomy). °¨ A cesarean section with a classic (vertical) incision on your uterus. Ask your health care provider what type of cesarean you had. Sometimes the scar on your skin is different than the scar on your uterus. °Even if you have had surgery on your uterus, certain types of ablation may still be safe for you. Talk with your health care provider. °LET YOUR HEALTH CARE PROVIDER KNOW ABOUT: °· Any allergies you have. °· All medicines you are taking, including vitamins, herbs, eye drops, creams, and over-the-counter medicines. °· Previous problems you or members of your family have had with the use of anesthetics. °· Any blood disorders you have. °· Previous surgeries you have had. °· Medical conditions you have. °RISKS AND  COMPLICATIONS  °Generally, this is a safe procedure. However, as with any procedure, complications can occur. Possible complications include: °· Perforation of the uterus. °· Bleeding. °· Infection of the uterus, bladder, or vagina. °· Injury to surrounding organs. °· An air bubble to the lung (air embolus). °· Pregnancy following the procedure. °· Failure of the procedure to help the problem, requiring hysterectomy. °· Decreased ability to diagnose cancer in the lining of the uterus. °BEFORE THE PROCEDURE °· The lining of the uterus must be tested to make sure there is no pre-cancerous or cancer cells present. °· An ultrasound may be performed to look at the size of the uterus and to check for abnormalities. °· Medicines may be given to thin the lining of the uterus. °PROCEDURE  °During the procedure, your health care provider will use a tool called a resectoscope to help see inside your uterus. There are different ways to remove the lining of your uterus.  °· Radiofrequency - This method uses a radiofrequency-alternating electric current to remove the lining of the uterus. °· Cryotherapy - This method uses extreme cold to freeze the lining of the uterus. °· Heated-Free Liquid - This method uses heated salt (saline) solution to remove the lining of the uterus. °· Microwave - This method uses high-energy microwaves to heat up the lining of the uterus to remove it. °· Thermal balloon - This method involves inserting a catheter with a balloon tip into the uterus. The balloon tip is filled with heated fluid to remove the lining of the uterus. °AFTER THE PROCEDURE  °After your procedure, do   not have sexual intercourse or insert anything into your vagina until permitted by your health care provider. After the procedure, you may experience: °· Cramps. °· Vaginal discharge. °· Frequent urination. °  °This information is not intended to replace advice given to you by your health care provider. Make sure you discuss any  questions you have with your health care provider. °  °Document Released: 10/18/2004 Document Revised: 08/30/2015 Document Reviewed: 05/12/2013 °[employer]vier Interactive Patient Education ©2016 [employer] ° °

## 2016-02-09 ENCOUNTER — Encounter (HOSPITAL_COMMUNITY): Payer: Self-pay | Admitting: *Deleted

## 2016-02-13 ENCOUNTER — Encounter (HOSPITAL_COMMUNITY): Payer: Self-pay

## 2016-02-20 ENCOUNTER — Ambulatory Visit (HOSPITAL_COMMUNITY): Payer: Medicaid Other | Admitting: Anesthesiology

## 2016-02-20 ENCOUNTER — Encounter (HOSPITAL_COMMUNITY)
Admission: RE | Disposition: A | Discharge status: Home / Self Care | Payer: Self-pay | Source: Ambulatory Visit | Attending: Obstetrics & Gynecology

## 2016-02-20 ENCOUNTER — Ambulatory Visit (HOSPITAL_COMMUNITY)
Admission: RE | Admit: 2016-02-20 | Discharge: 2016-02-20 | Disposition: A | Discharge status: Home / Self Care | Payer: Medicaid Other | Source: Ambulatory Visit | Attending: Obstetrics & Gynecology | Admitting: Obstetrics & Gynecology

## 2016-02-20 ENCOUNTER — Encounter (HOSPITAL_COMMUNITY): Payer: Self-pay | Admitting: *Deleted

## 2016-02-20 DIAGNOSIS — F17210 Nicotine dependence, cigarettes, uncomplicated: Secondary | ICD-10-CM | POA: Diagnosis not present

## 2016-02-20 DIAGNOSIS — D259 Leiomyoma of uterus, unspecified: Secondary | ICD-10-CM | POA: Insufficient documentation

## 2016-02-20 DIAGNOSIS — J45909 Unspecified asthma, uncomplicated: Secondary | ICD-10-CM | POA: Insufficient documentation

## 2016-02-20 DIAGNOSIS — N939 Abnormal uterine and vaginal bleeding, unspecified: Secondary | ICD-10-CM | POA: Diagnosis present

## 2016-02-20 HISTORY — PX: DILITATION & CURRETTAGE/HYSTROSCOPY WITH NOVASURE ABLATION: SHX5568

## 2016-02-20 HISTORY — DX: Unspecified convulsions: R56.9

## 2016-02-20 HISTORY — DX: Abnormal levels of other serum enzymes: R74.8

## 2016-02-20 LAB — CBC
HEMATOCRIT: 38 % (ref 36.0–46.0)
HEMOGLOBIN: 12.8 g/dL (ref 12.0–15.0)
MCH: 29.2 pg (ref 26.0–34.0)
MCHC: 33.7 g/dL (ref 30.0–36.0)
MCV: 86.8 fL (ref 78.0–100.0)
Platelets: 246 10*3/uL (ref 150–400)
RBC: 4.38 MIL/uL (ref 3.87–5.11)
RDW: 12.9 % (ref 11.5–15.5)
WBC: 6.8 10*3/uL (ref 4.0–10.5)

## 2016-02-20 LAB — PREGNANCY, URINE: Preg Test, Ur: NEGATIVE

## 2016-02-20 SURGERY — DILATATION & CURETTAGE/HYSTEROSCOPY WITH NOVASURE ABLATION
Anesthesia: General | Site: Vagina

## 2016-02-20 MED ORDER — MIDAZOLAM HCL 5 MG/5ML IJ SOLN
INTRAMUSCULAR | Status: DC | PRN
  Administered 2016-02-20: 2 mg via INTRAVENOUS

## 2016-02-20 MED ORDER — TRAMADOL HCL 50 MG PO TABS
50.0000 mg | ORAL_TABLET | Freq: Four times a day (QID) | ORAL | Status: DC | PRN
Start: 2016-02-20 — End: 2016-03-07

## 2016-02-20 MED ORDER — BUPIVACAINE HCL (PF) 0.5 % IJ SOLN
INTRAMUSCULAR | Status: AC
  Filled 2016-02-20: qty 30

## 2016-02-20 MED ORDER — OXYCODONE HCL 5 MG PO TABS: 5.0000 mg | ORAL_TABLET | Freq: Once | ORAL | Status: DC | PRN

## 2016-02-20 MED ORDER — HYDROMORPHONE HCL 1 MG/ML IJ SOLN
INTRAMUSCULAR | Status: AC
  Administered 2016-02-20: 0.25 mg via INTRAVENOUS
  Filled 2016-02-20: qty 1

## 2016-02-20 MED ORDER — LIDOCAINE HCL (CARDIAC) 20 MG/ML IV SOLN
INTRAVENOUS | Status: DC | PRN
  Administered 2016-02-20: 100 mg via INTRAVENOUS

## 2016-02-20 MED ORDER — ONDANSETRON HCL 4 MG/2ML IJ SOLN
INTRAMUSCULAR | Status: AC
  Filled 2016-02-20: qty 2

## 2016-02-20 MED ORDER — DEXAMETHASONE SODIUM PHOSPHATE 4 MG/ML IJ SOLN
INTRAMUSCULAR | Status: AC
  Filled 2016-02-20: qty 1

## 2016-02-20 MED ORDER — PROMETHAZINE HCL 25 MG/ML IJ SOLN
6.2500 mg | INTRAMUSCULAR | Status: DC | PRN
Start: 2016-02-20 — End: 2016-02-20

## 2016-02-20 MED ORDER — SCOPOLAMINE 1 MG/3DAYS TD PT72
MEDICATED_PATCH | TRANSDERMAL | Status: AC
  Administered 2016-02-20: 1.5 mg via TRANSDERMAL
  Filled 2016-02-20: qty 1

## 2016-02-20 MED ORDER — ONDANSETRON HCL 4 MG/2ML IJ SOLN
INTRAMUSCULAR | Status: DC | PRN
  Administered 2016-02-20: 4 mg via INTRAVENOUS

## 2016-02-20 MED ORDER — BUPIVACAINE HCL (PF) 0.5 % IJ SOLN
INTRAMUSCULAR | Status: DC | PRN
  Administered 2016-02-20: 10 mL

## 2016-02-20 MED ORDER — MIDAZOLAM HCL 2 MG/2ML IJ SOLN
INTRAMUSCULAR | Status: AC
  Filled 2016-02-20: qty 2

## 2016-02-20 MED ORDER — HYDROMORPHONE HCL 1 MG/ML IJ SOLN
0.2500 mg | INTRAMUSCULAR | Status: DC | PRN
  Administered 2016-02-20 (×3): 0.25 mg via INTRAVENOUS

## 2016-02-20 MED ORDER — FENTANYL CITRATE (PF) 100 MCG/2ML IJ SOLN
INTRAMUSCULAR | Status: DC | PRN
  Administered 2016-02-20: 25 ug via INTRAVENOUS
  Administered 2016-02-20: 50 ug via INTRAVENOUS
  Administered 2016-02-20: 25 ug via INTRAVENOUS

## 2016-02-20 MED ORDER — SCOPOLAMINE 1 MG/3DAYS TD PT72
1.0000 | MEDICATED_PATCH | Freq: Once | TRANSDERMAL | Status: DC
  Administered 2016-02-20: 1.5 mg via TRANSDERMAL

## 2016-02-20 MED ORDER — PROPOFOL 10 MG/ML IV BOLUS
INTRAVENOUS | Status: AC
  Filled 2016-02-20: qty 20

## 2016-02-20 MED ORDER — NORGESTIMATE-ETH ESTRADIOL 0.25-35 MG-MCG PO TABS: 1.0000 | ORAL_TABLET | Freq: Every day | ORAL | Status: DC

## 2016-02-20 MED ORDER — OXYCODONE HCL 5 MG/5ML PO SOLN: 5.0000 mg | Freq: Once | ORAL | Status: DC | PRN

## 2016-02-20 MED ORDER — LIDOCAINE HCL (CARDIAC) 20 MG/ML IV SOLN
INTRAVENOUS | Status: AC
  Filled 2016-02-20: qty 5

## 2016-02-20 MED ORDER — PROPOFOL 10 MG/ML IV BOLUS
INTRAVENOUS | Status: DC | PRN
  Administered 2016-02-20: 200 mg via INTRAVENOUS

## 2016-02-20 MED ORDER — FENTANYL CITRATE (PF) 100 MCG/2ML IJ SOLN
INTRAMUSCULAR | Status: AC
  Filled 2016-02-20: qty 2

## 2016-02-20 MED ORDER — LACTATED RINGERS IV SOLN
INTRAVENOUS | Status: DC
  Administered 2016-02-20 (×2): via INTRAVENOUS

## 2016-02-20 MED ORDER — LACTATED RINGERS IR SOLN
Status: DC | PRN
  Administered 2016-02-20: 3000 mL

## 2016-02-20 MED ORDER — DEXAMETHASONE SODIUM PHOSPHATE 4 MG/ML IJ SOLN
INTRAMUSCULAR | Status: DC | PRN
  Administered 2016-02-20: 4 mg via INTRAVENOUS

## 2016-02-20 SURGICAL SUPPLY — 15 items
ABLATOR ENDOMETRIAL BIPOLAR (ABLATOR) ×3 IMPLANT
CATH ROBINSON RED A/P 16FR (CATHETERS) ×3 IMPLANT
CLOTH BEACON ORANGE TIMEOUT ST (SAFETY) ×3 IMPLANT
CONTAINER PREFILL 10% NBF 60ML (FORM) ×2 IMPLANT
GLOVE BIO SURGEON STRL SZ7 (GLOVE) ×3 IMPLANT
GLOVE BIOGEL PI IND STRL 7.0 (GLOVE) ×2 IMPLANT
GLOVE BIOGEL PI INDICATOR 7.0 (GLOVE) ×4
GOWN STRL REUS W/TWL LRG LVL3 (GOWN DISPOSABLE) ×6 IMPLANT
GOWN STRL REUS W/TWL XL LVL3 (GOWN DISPOSABLE) ×3 IMPLANT
PACK VAGINAL MINOR WOMEN LF (CUSTOM PROCEDURE TRAY) ×3 IMPLANT
PAD OB MATERNITY 4.3X12.25 (PERSONAL CARE ITEMS) ×3 IMPLANT
TOWEL OR 17X24 6PK STRL BLUE (TOWEL DISPOSABLE) ×6 IMPLANT
TUBING AQUILEX INFLOW (TUBING) ×3 IMPLANT
TUBING AQUILEX OUTFLOW (TUBING) ×3 IMPLANT
WATER STERILE IRR 1000ML POUR (IV SOLUTION) ×3 IMPLANT

## 2016-02-20 NOTE — Anesthesia Postprocedure Evaluation (Signed)
Anesthesia Post Note  Patient: [name]  Procedure(s) Performed: Procedure(s) (LRB): HYSTEROSCOPY WITH NOVASURE ABLATION (N/A)  Patient location during evaluation: PACU Anesthesia Type: General Level of consciousness: awake and alert Pain management: pain level controlled Vital Signs Assessment: post-procedure vital signs reviewed and stable Respiratory status: spontaneous breathing, nonlabored ventilation, respiratory function stable and patient connected to nasal cannula oxygen Cardiovascular status: blood pressure returned to baseline and stable Postop Assessment: no signs of nausea or vomiting Anesthetic complications: no    Last Vitals:  Filed Vitals:   02/20/16 0947 02/20/16 1150  BP: 137/96 131/84  Pulse: 70 75  Temp: 36.7 C 36.8 C  Resp: 18 16    Last Pain:  Filed Vitals:   02/20/16 1159  PainSc: 9                  Labrandon Knoch

## 2016-02-20 NOTE — Anesthesia Preprocedure Evaluation (Signed)
Anesthesia Evaluation  Patient identified by MRN, date of birth, ID band Patient awake    Reviewed: Allergy & Precautions, NPO status , Patient's Chart, lab work & pertinent test results  Airway Mallampati: II  TM Distance: >3 FB Neck ROM: Full    Dental no notable dental hx.    Pulmonary asthma , Current Smoker,    Pulmonary exam normal breath sounds clear to auscultation       Cardiovascular negative cardio ROS Normal cardiovascular exam Rhythm:Regular Rate:Normal     Neuro/Psych negative neurological ROS  negative psych ROS   GI/Hepatic negative GI ROS, Neg liver ROS,   Endo/Other  negative endocrine ROS  Renal/GU negative Renal ROS  negative genitourinary   Musculoskeletal negative musculoskeletal ROS (+)   Abdominal   Peds negative pediatric ROS (+)  Hematology negative hematology ROS (+)   Anesthesia Other Findings   Reproductive/Obstetrics negative OB ROS                             Anesthesia Physical Anesthesia Plan  ASA: II  Anesthesia Plan: General   Post-op Pain Management:    Induction: Intravenous  Airway Management Planned: LMA and Oral ETT  Additional Equipment:   Intra-op Plan:   Post-operative Plan: Extubation in OR  Informed Consent: I have reviewed the patients History and Physical, chart, labs and discussed the procedure including the risks, benefits and alternatives for the proposed anesthesia with the patient or authorized representative who has indicated his/her understanding and acceptance.   Dental advisory given  Plan Discussed with: CRNA and Surgeon  Anesthesia Plan Comments:         Anesthesia Quick Evaluation

## 2016-02-20 NOTE — Brief Op Note (Signed)
02/20/2016  11:45 AM  PATIENT:  [name]  35 y.o. female  PRE-OPERATIVE DIAGNOSIS:   Abnormal uterine bleeding and fibroid uterus  POST-OPERATIVE DIAGNOSIS:   Abnormal uterine bleeding and fibroid uterus  PROCEDURE:  Procedure(s): HYSTEROSCOPY WITH NOVASURE ABLATION (N/A)  SURGEON:  Surgeon(s) and Role:    * [name], MD - Primary  ANESTHESIA:   general and paracervical block  EBL:  Total I/O In: 1000 [I.V.:1000] Out: 75 [Urine:75]  BLOOD ADMINISTERED:none  DRAINS: none   LOCAL MEDICATIONS USED:  MARCAINE     SPECIMEN:  Source of Specimen:  endometrial currettings  DISPOSITION OF SPECIMEN:  PATHOLOGY  COUNTS:  YES  TOURNIQUET:  * No tourniquets in log *  DICTATION: .Note written in EPIC  PLAN OF CARE: Discharge to home after PACU  PATIENT DISPOSITION:  PACU - hemodynamically stable.   Delay start of Pharmacological VTE agent (>24hrs) due to surgical blood loss or risk of bleeding: not applicable  Complications: none immediate

## 2016-02-20 NOTE — Discharge Instructions (Signed)
Hysteroscopy, Care After Refer to this sheet in the next few weeks. These instructions provide you with information on caring for yourself after your procedure. Your health care provider may also give you more specific instructions. Your treatment has been planned according to current medical practices, but problems sometimes occur. Call your health care provider if you have any problems or questions after your procedure.  WHAT TO EXPECT AFTER THE PROCEDURE After your procedure, it is typical to have the following:  You may have some cramping. This normally lasts for a couple days.  You may have bleeding. This can vary from light spotting for a few days to menstrual-like bleeding for 3-7 days. HOME CARE INSTRUCTIONS  Rest for the first 1-2 days after the procedure.  Only take over-the-counter or prescription medicines as directed by your health care provider. Do not take aspirin. It can increase the chances of bleeding.  Take showers instead of baths for 2 weeks or as directed by your health care provider.  Do not drive for 24 hours or as directed.  Do not drink alcohol while taking pain medicine.  Do not use tampons, douche, or have sexual intercourse for 2 weeks or until your health care provider says it is okay.  Take your temperature twice a day for 4-5 days. Write it down each time.  Follow your health care provider's advice about diet, exercise, and lifting.  If you develop constipation, you may:  Take a mild laxative if your health care provider approves.  Add bran foods to your diet.  Drink enough fluids to keep your urine clear or pale yellow.  Try to have someone with you or available to you for the first 24-48 hours, especially if you were given a general anesthetic.  Follow up with your health care provider as directed. SEEK MEDICAL CARE IF:  You feel dizzy or lightheaded.  You feel sick to your stomach (nauseous).  You have abnormal vaginal discharge.  You  have a rash.  You have pain that is not controlled with medicine. SEEK IMMEDIATE MEDICAL CARE IF:  You have bleeding that is heavier than a normal menstrual period.  You have a fever.  You have increasing cramps or pain, not controlled with medicine.  You have new belly (abdominal) pain.  You pass out.  You have pain in the tops of your shoulders (shoulder strap areas).  You have shortness of breath.   This information is not intended to replace advice given to you by your health care provider. Make sure you discuss any questions you have with your health care provider.   Document Released: 09/29/2013 Document Reviewed: 09/29/2013 Elsevier Interactive Patient Education 2016 [employer]. Endometrial Ablation Endometrial ablation removes the lining of the uterus (endometrium). It is usually a same-day, outpatient treatment. Ablation helps avoid major surgery, such as surgery to remove the cervix and uterus (hysterectomy). After endometrial ablation, you will have little or no menstrual bleeding and may not be able to have children. However, if you are premenopausal, you will need to use a reliable method of birth control following the procedure because of the small chance that pregnancy can occur. There are different reasons to have this procedure. These reasons include:  Heavy periods.  Bleeding that is causing anemia.  Irregular bleeding.  Bleeding fibroids on the lining inside the uterus if they are smaller than 3 centimeters. This procedure may not be possible for you if:   You want to have children in the future.  You have severe cramps with your menstrual period.   You have precancerous or cancerous cells in your uterus.   You were recently pregnant.   You have gone through menopause.   You have had major surgery on your uterus, resulting in thinning of the uterine wall. Surgeries may include:  The removal of one or more uterine fibroids (myomectomy).  A  cesarean section with a classic (vertical) incision on your uterus. Ask your health care provider what type of cesarean you had. Sometimes the scar on your skin is different than the scar on your uterus. Even if you have had surgery on your uterus, certain types of ablation may still be safe for you. Talk with your health care provider. LET [facility] CARE PROVIDER KNOW ABOUT:  Any allergies you have.  All medicines you are taking, including vitamins, herbs, eye drops, creams, and over-the-counter medicines.  Previous problems you or members of your family have had with the use of anesthetics.  Any blood disorders you have.  Previous surgeries you have had.  Medical conditions you have. RISKS AND COMPLICATIONS  Generally, this is a safe procedure. However, as with any procedure, complications can occur. Possible complications include:  Perforation of the uterus.  Bleeding.  Infection of the uterus, bladder, or vagina.  Injury to surrounding organs.  An air bubble to the lung (air embolus).  Pregnancy following the procedure.  Failure of the procedure to help the problem, requiring hysterectomy.  Decreased ability to diagnose cancer in the lining of the uterus. BEFORE THE PROCEDURE  The lining of the uterus must be tested to make sure there is no pre-cancerous or cancer cells present.  An ultrasound may be performed to look at the size of the uterus and to check for abnormalities.  Medicines may be given to thin the lining of the uterus. PROCEDURE  During the procedure, your health care provider will use a tool called a resectoscope to help see inside your uterus. There are different ways to remove the lining of your uterus.   Radiofrequency - This method uses a radiofrequency-alternating electric current to remove the lining of the uterus.  Cryotherapy - This method uses extreme cold to freeze the lining of the uterus.  Heated-Free Liquid - This method uses heated salt  (saline) solution to remove the lining of the uterus.  Microwave - This method uses high-energy microwaves to heat up the lining of the uterus to remove it.  Thermal balloon - This method involves inserting a catheter with a balloon tip into the uterus. The balloon tip is filled with heated fluid to remove the lining of the uterus. AFTER THE PROCEDURE  After your procedure, do not have sexual intercourse or insert anything into your vagina until permitted by your health care provider. After the procedure, you may experience:  Cramps.  Vaginal discharge.  Frequent urination.   This information is not intended to replace advice given to you by your health care provider. Make sure you discuss any questions you have with your health care provider.   Document Released: 10/18/2004 Document Revised: 08/30/2015 Document Reviewed: 05/12/2013 Elsevier Interactive Patient Education [employer].

## 2016-02-20 NOTE — Transfer of Care (Signed)
Immediate Anesthesia Transfer of Care Note  Patient: [name]  Procedure(s) Performed: Procedure(s): HYSTEROSCOPY WITH NOVASURE ABLATION (N/A)  Patient Location: PACU  Anesthesia Type:General  Level of Consciousness: sedated  Airway & Oxygen Therapy: Patient Spontanous Breathing and Patient connected to nasal cannula oxygen  Post-op Assessment: Report given to RN  Post vital signs: Reviewed  Last Vitals:  Filed Vitals:   02/20/16 0947  BP: 137/96  Pulse: 70  Temp: 36.7 C  Resp: 18    Complications: No apparent anesthesia complications

## 2016-02-20 NOTE — Anesthesia Procedure Notes (Signed)
Procedure Name: LMA Insertion Date/Time: 02/20/2016 11:14 AM Performed by: Talbot Grumbling Pre-anesthesia Checklist: Patient identified, Emergency Drugs available, Suction available and Patient being monitored Patient Re-evaluated:Patient Re-evaluated prior to inductionOxygen Delivery Method: Circle system utilized Preoxygenation: Pre-oxygenation with 100% oxygen Intubation Type: IV induction Ventilation: Mask ventilation without difficulty LMA: LMA inserted LMA Size: 4.0 Number of attempts: 1 Placement Confirmation: positive ETCO2 and breath sounds checked- equal and bilateral Tube secured with: Tape Dental Injury: Teeth and Oropharynx as per pre-operative assessment

## 2016-02-20 NOTE — Op Note (Signed)
02/20/2016  11:45 AM  PATIENT:  [name]  35 y.o. female  PRE-OPERATIVE DIAGNOSIS:   Abnormal uterine bleeding and fibroid uterus  POST-OPERATIVE DIAGNOSIS:   Abnormal uterine bleeding and fibroid uterus  PROCEDURE:  Procedure(s): HYSTEROSCOPY WITH NOVASURE ABLATION (N/A)  SURGEON:  Surgeon(s) and Role:    * [name], MD - Primary  ANESTHESIA:   general and paracervical block  EBL:  Total I/O In: 1000 [I.V.:1000] Out: 75 [Urine:75]  BLOOD ADMINISTERED:none  DRAINS: none   LOCAL MEDICATIONS USED:  MARCAINE     SPECIMEN:  Source of Specimen:  endometrial currettings  DISPOSITION OF SPECIMEN:  PATHOLOGY  COUNTS:  YES  TOURNIQUET:  * No tourniquets in log *  DICTATION: .Note written in EPIC  PLAN OF CARE: Discharge to home after PACU  PATIENT DISPOSITION:  PACU - hemodynamically stable.   Delay start of Pharmacological VTE agent (>24hrs) due to surgical blood loss or risk of bleeding: not applicable  Complications: none immediate   The risks, benefits, and alternatives of surgery were explained, understood, and accepted. The consents were signed and all questions were answered. She was taken to the operating room and general anesthesia was applied without complication. She was placed in the dorsal lithotomy position and her vagina and abdomen were prepped and draped in the usual sterile fashion. A bimanual exam revealed a normal size and shape anteverted mobile uterus. Her adnexa were non-enlarged.   A bivalved speculum was placed in the patients' vagina and the anterior lip of the cervix was grasped with a single toothed tenaculum. A paracervical block was performed at 5 and 7 o'clock with 10cc of 0.5% Marcaine.   The endometrial cavity was sounded to 8.5cm and the endocervical length measured 2cm. A hysteroscope was inserted and the endometrium was noted to be thickened with several polyps.  The ostia on both sides were noted.  The scope was  removed and a sharp currete was used to scape the lining of the uterus until a gritty texture was noted throughout.  Specimens were sent to pathology.  The NovaSure device was then inserted and seated using 6.5cm as the cavity length and 3.5cm as the cavity width.  The total activation time was 68 sec at a power of 125.  The hysteroscope was reinserted and an even burn pattern was noted to the fundus.  The single toothed tenaculum was removed at the end of the case and no bleeding was noted from the cervix.   The patient was extubated and taken to the recovery room in stable condition.  Sponge, lap and instrument counts were correct.  There were no complications.  [name], M.D., [place]. Harraway-Smith, M.D., [name]

## 2016-02-20 NOTE — H&P (Signed)
Preoperative History and Physical  [name] Toney Rakes is a 35 y.o. [id number] here for surgical management of AUB.   Proposed surgery: hysteroscopy and endometrial ablation using Novasure with possible resection of uterine fibroids  Past Medical History  Diagnosis Date  . Irritable bowel   . Asthma     meds as needed  . Ectopic pregnancy     requiring surgery  . Gonorrhea   . Preterm labor   . Arthritis   . Chronic back pain   . Migraine headache   . Seizures (HCC)     fever induced during childhood  . Elevated liver enzymes    Past Surgical History  Procedure Laterality Date  . Wisdom tooth extraction    . Tubal ligation  2005  . Laparoscopy  05/30/2012    Procedure: LAPAROSCOPY OPERATIVE;  Surgeon: Osborne Oman, MD;  Location: [facility];  Service: Gynecology;  Laterality: N/A;  Operative laparoscopy right salpingectomy and removal of ectopic pregnancy  . Salpingectomy    . Laparoscopy for ectopic pregnancy     OB History    Gravida Para Term Preterm AB TAB SAB Ectopic Multiple Living   6 3 2 1 2  1 1  3      Patient denies any cervical dysplasia or STIs. Prescriptions prior to admission  Medication Sig Dispense Refill Last Dose  . ibuprofen (ADVIL,MOTRIN) 600 MG tablet Take 1 tablet (600 mg total) by mouth every 6 (six) hours as needed. 30 tablet 0 Past Month at Unknown time  . norgestimate-ethinyl estradiol (ORTHO-CYCLEN,SPRINTEC,PREVIFEM) 0.25-35 MG-MCG tablet Take 3 tabs per day for 3 days, 2 pills a day for 3 days, and 1 pill a day skipping the active pills 1 Package 0 02/15/2016  . albuterol (PROVENTIL HFA;VENTOLIN HFA) 108 (90 BASE) MCG/ACT inhaler Inhale 1-2 puffs into the lungs every 6 (six) hours as needed for wheezing or shortness of breath. 1 Inhaler 0 Unknown at Unknown time  . albuterol (PROVENTIL) (2.5 MG/3ML) 0.083% nebulizer solution Take 3 mLs (2.5 mg total) by nebulization every 6 (six) hours as needed for wheezing or shortness of breath. 75 mL 1 Unknown  at Unknown time  . benzonatate (TESSALON) 100 MG capsule Take 1 capsule (100 mg total) by mouth every 8 (eight) hours. (Patient not taking: Reported on 02/08/2016) 15 capsule 0 Not Taking  . methocarbamol (ROBAXIN-750) 750 MG tablet Take 1 tablet (750 mg total) by mouth 4 (four) times daily. (Patient not taking: Reported on 02/08/2016) 30 tablet 0 Not Taking  . nystatin cream (MYCOSTATIN) Apply to affected area 2 times daily (Patient not taking: Reported on 02/08/2016) 15 g 0 Not Taking  . ondansetron (ZOFRAN ODT) 4 MG disintegrating tablet Take 1 tablet (4 mg total) by mouth every 8 (eight) hours as needed for nausea or vomiting. (Patient not taking: Reported on 02/08/2016) 6 tablet 0 Not Taking  . propranolol (INDERAL) 60 MG tablet Take 1 tablet (60 mg total) by mouth 2 (two) times daily. (Patient not taking: Reported on 01/13/2016) 60 tablet 12 Not Taking  . SPRINTEC 28 0.25-35 MG-MCG tablet TAKE 1 TABLET BY MOUTH DAILY. (Patient not taking: Reported on 02/09/2016) 28 tablet 10 Taking    Allergies  Allergen Reactions  . Hydrocodeine [Dihydrocodeine] Hives  . Latex Hives  . Raspberry Hives, Itching and Swelling  . Vioxx [Rofecoxib] Hives   Social History:   reports that she has been smoking Cigarettes.  She has a 5 pack-year smoking history. She has never used smokeless tobacco.  She reports that she drinks alcohol. She reports that she does not use illicit drugs. Family History  Problem Relation Age of Onset  . Asthma Brother     Review of Systems: Noncontributory  PHYSICAL EXAM: Blood pressure 137/96, pulse 70, temperature 98 F (36.7 C), temperature source Oral, resp. rate 18, height 5\' 4"  (1.626 m), weight 194 lb (87.998 kg), SpO2 100 %. General appearance - alert, well appearing, and in no distress Chest - clear to auscultation, no wheezes, rales or rhonchi, symmetric air entry Heart - normal rate and regular rhythm Abdomen - soft, nontender, nondistended, no masses or  organomegaly Pelvic - examination not indicated Extremities - peripheral pulses normal, no pedal edema, no clubbing or cyanosis  Labs: Results for orders placed or performed during the hospital encounter of 02/20/16 (from the past 336 hour(s))  Pregnancy, urine   Collection Time: 02/20/16  9:33 AM  Result Value Ref Range   Preg Test, Ur NEGATIVE NEGATIVE  CBC   Collection Time: 02/20/16  9:54 AM  Result Value Ref Range   WBC 6.8 4.0 - 10.5 K/uL   RBC 4.38 3.87 - 5.11 MIL/uL   Hemoglobin 12.8 12.0 - 15.0 g/dL   HCT 38.0 36.0 - 46.0 %   MCV 86.8 78.0 - 100.0 fL   MCH 29.2 26.0 - 34.0 pg   MCHC 33.7 30.0 - 36.0 g/dL   RDW 12.9 11.5 - 15.5 %   Platelets 246 150 - 400 K/uL    Imaging Studies: 11/02/2015 CLINICAL DATA: Patient with abnormal uterine bleeding.  EXAM: TRANSABDOMINAL AND TRANSVAGINAL ULTRASOUND OF PELVIS  TECHNIQUE: Both transabdominal and transvaginal ultrasound examinations of the pelvis were performed. Transabdominal technique was performed for global imaging of the pelvis including uterus, ovaries, adnexal regions, and pelvic cul-de-sac. It was necessary to proceed with endovaginal exam following the transabdominal exam to visualize the endometrium.  COMPARISON: None  FINDINGS: Uterus  Measurements: 8.6 x 4.6 x 6.0 cm. Within the anterior left uterine body there is a 2.4 x 2.1 x 1.9 cm intramural fibroid.  Endometrium  Thickness: 17 mm. No focal abnormality visualized.  Right ovary  Measurements: 3.3 x 1.8 x 2.1 cm. Normal appearance/no adnexal mass.  Left ovary  Measurements: 2.8 x 1.9 x 2.3 cm. Normal appearance/no adnexal mass.  Other findings  No free fluid.  IMPRESSION: Fibroid uterus.  Endometrium measures 17 mm. If bleeding remains unresponsive to hormonal or medical therapy, focal lesion work-up with sonohysterogram should be considered. Endometrial biopsy should also be considered in pre-menopausal patients at  high risk for endometrial carcinoma. (Ref: Radiological Reasoning: Algorithmic Workup of Abnormal Vaginal Bleeding with Endovaginal Sonography and Sonohysterography. [id number] [id number]) Assessment: Patient Active Problem List   Diagnosis Date Noted  . Fibroid uterus 11/02/2015  . Abnormal uterine bleeding (AUB) 11/02/2015  . Asthma exacerbation 03/23/2015  . Tobacco abuse 03/23/2015  . History of migraine 03/23/2015  . Acute asthma exacerbation   . Cough   . Acute bronchitis   . Asthma 03/22/2015  . Migraine, unspecified, without mention of intractable migraine without mention of status migrainosus 03/07/2014    Plan: Patient will undergo surgical management with hysteroscopy and endometrial ablation using Novasure with possible resection of uterine fibroids.   The risks of surgery were discussed in detail with the patient including but not limited to: bleeding which may require transfusion or reoperation; infection which may require antibiotics; injury to surrounding organs which may involve bowel, bladder, ureters ; need for additional procedures including laparoscopy or  laparotomy; thromboembolic phenomenon, surgical site problems and other postoperative/anesthesia complications. Likelihood of success in alleviating the patient's condition was discussed. Routine postoperative instructions will be reviewed with the patient and her family in detail after surgery.  The patient concurred with the proposed plan, giving informed written consent for the surgery.  Patient has been NPO since last night she will remain NPO for procedure.  Anesthesia and OR aware.  Preoperative prophylactic antibiotics and SCDs ordered on call to the OR.  To OR when ready.  Bodhi Moradi L. Ihor Dow, M.D., [facility] 02/20/2016 10:49 AM

## 2016-02-21 ENCOUNTER — Encounter (HOSPITAL_COMMUNITY): Payer: Self-pay | Admitting: Obstetrics & Gynecology

## 2016-02-29 ENCOUNTER — Encounter: Payer: Self-pay | Admitting: Obstetrics & Gynecology

## 2016-02-29 ENCOUNTER — Ambulatory Visit (INDEPENDENT_AMBULATORY_CARE_PROVIDER_SITE_OTHER): Payer: Medicaid Other | Admitting: Obstetrics & Gynecology

## 2016-02-29 VITALS — BP 138/95 | HR 74 | Temp 98.1°F | Wt 194.6 lb

## 2016-02-29 DIAGNOSIS — Z9889 Other specified postprocedural states: Secondary | ICD-10-CM

## 2016-02-29 NOTE — Progress Notes (Signed)
Patient ID: [name], female   DOB: 06-27-1981, 35 y.o.   MRN: [id number] History:  35 y.o. [id number] here today for 2 week post op check.  Pt reports min spotting.  She reports some cramping.     The following portions of the patient's history were reviewed and updated as appropriate: allergies, current medications, past family history, past medical history, past social history, past surgical history and problem list.  Review of Systems:  Pertinent items are noted in HPI.  Objective:  Physical Exam Blood pressure 138/95, pulse 74, temperature 98.1 F (36.7 C), weight 194 lb 9.6 oz (88.27 kg). Gen: NAD Abd: Soft, nontender and nondistended Pelvic:deferred  Labs and Imaging 02/20/2016 Diagnosis Endometrium, curettage - INACTIVE ENDOMETRIUM WITH PROGESTATIONAL CHANGES. - NO HYPERPLASIA OR MALIGNANCY  Assessment & Plan:  2 week post op check F/u in 3 months or sooner prn NSAIDs prn   Keiston Manley L. Harraway-Smith, M.D., [name]

## 2016-02-29 NOTE — Patient Instructions (Signed)
Hysteroscopy, Care After  Refer to this sheet in the next few weeks. These instructions provide you with information on caring for yourself after your procedure. Your health care provider may also give you more specific instructions. Your treatment has been planned according to current medical practices, but problems sometimes occur. Call your health care provider if you have any problems or questions after your procedure.   WHAT TO EXPECT AFTER THE PROCEDURE  After your procedure, it is typical to have the following:  · You may have some cramping. This normally lasts for a couple days.  · You may have bleeding. This can vary from light spotting for a few days to menstrual-like bleeding for 3-7 days.  HOME CARE INSTRUCTIONS  · Rest for the first 1-2 days after the procedure.  · Only take over-the-counter or prescription medicines as directed by your health care provider. Do not take aspirin. It can increase the chances of bleeding.  · Take showers instead of baths for 2 weeks or as directed by your health care provider.  · Do not drive for 24 hours or as directed.  · Do not drink alcohol while taking pain medicine.  · Do not use tampons, douche, or have sexual intercourse for 2 weeks or until your health care provider says it is okay.  · Take your temperature twice a day for 4-5 days. Write it down each time.  · Follow your health care provider's advice about diet, exercise, and lifting.  · If you develop constipation, you may:    Take a mild laxative if your health care provider approves.    Add bran foods to your diet.    Drink enough fluids to keep your urine clear or pale yellow.  · Try to have someone with you or available to you for the first 24-48 hours, especially if you were given a general anesthetic.  · Follow up with your health care provider as directed.  SEEK MEDICAL CARE IF:  · You feel dizzy or lightheaded.  · You feel sick to your stomach (nauseous).  · You have abnormal vaginal discharge.  · You  have a rash.  · You have pain that is not controlled with medicine.  SEEK IMMEDIATE MEDICAL CARE IF:  · You have bleeding that is heavier than a normal menstrual period.  · You have a fever.  · You have increasing cramps or pain, not controlled with medicine.  · You have new belly (abdominal) pain.  · You pass out.  · You have pain in the tops of your shoulders (shoulder strap areas).  · You have shortness of breath.     This information is not intended to replace advice given to you by your health care provider. Make sure you discuss any questions you have with your health care provider.     Document Released: 09/29/2013 Document Reviewed: 09/29/2013  Elsevier Interactive Patient Education ©2016 Elsevier Inc.

## 2016-03-06 ENCOUNTER — Ambulatory Visit: Payer: Medicaid Other | Admitting: Adult Health

## 2016-03-06 ENCOUNTER — Ambulatory Visit: Payer: Medicaid Other | Admitting: Obstetrics & Gynecology

## 2016-03-07 ENCOUNTER — Ambulatory Visit (INDEPENDENT_AMBULATORY_CARE_PROVIDER_SITE_OTHER): Payer: Medicaid Other | Admitting: Adult Health

## 2016-03-07 ENCOUNTER — Encounter: Payer: Self-pay | Admitting: Adult Health

## 2016-03-07 VITALS — BP 139/94 | HR 73 | Resp 16 | Ht 64.0 in | Wt 195.0 lb

## 2016-03-07 DIAGNOSIS — G43019 Migraine without aura, intractable, without status migrainosus: Secondary | ICD-10-CM

## 2016-03-07 MED ORDER — RIZATRIPTAN BENZOATE 10 MG PO TBDP: 10.0000 mg | ORAL_TABLET | ORAL | Status: DC | PRN

## 2016-03-07 MED ORDER — NORTRIPTYLINE HCL 10 MG PO CAPS: ORAL_CAPSULE | ORAL | Status: DC

## 2016-03-07 NOTE — Patient Instructions (Signed)
Begin Nortriptyline 10 mg at bedtime then increase to two tablets (20mg ) thereafter. Maxalt to use at onset of of headache If your symptoms worsen or you develop new symptoms please let us know.

## 2016-03-07 NOTE — Progress Notes (Signed)
PATIENT: [name] DOB: 05-29-1981  REASON FOR VISIT: follow up-headaches HISTORY FROM: patient  HISTORY OF PRESENT ILLNESS: Today 03/07/2016:  [name] is a 35 year old female with a history of headaches. She returns today for an evaluation. The patient has not been seen in this office in 2 years. The patient was having sharp stabbing headaches associated with intercourse however her headaches transformed to daily. She was on nortriptyline and propranolol at one time. She reports that this was working well for her. She states that she has not been taking propranolol since December. She states that she ran out of the medication and did not come in for an office visit. She states that she continued on the nortriptyline however she ran out of this medication 2 days ago. Her headaches are normally located in the center of the head and she describes it as a sharp stabbing pain this will occasionally moved to the frontal and then the temporal regions. She states that she has 2-3 sharp shooting headaches a week but she will always have a dull headache. She reports photophobia and phonophobia as well as nausea and vomiting with her headaches. She states that noise is a trigger for headaches. 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 She returns today for an evaluation.  HISTORY (YAN):  [name] is a 35 years old right-handed African American female, referred by her primary care physician Dr. Criss Rosales for evaluation of headaches  Since 2005, she has headache associated with intercourse, she only has headaches after orgasm exploding, pounding, lasting 10-15 minutes, gradually subsided, over the past 6 months, she has worsening headaches, severe pounding headaches after intercourse, and also lingers, lasting 2-3 days. 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  She is now having headache more frequent, not associated with intercourse, Almost daily, it will not go away, stabbing pain at her left occipital regions, left eye jumpy pain, lasting on and off all day.  She is taking ibuprofen 800 mg, Maxalt as needed, which only take only temporarily, headache usually come back the same day again she is also taking Inderal 40 mg as preventive medications, no significant side effect  REVIEW OF SYSTEMS: Out of a complete 14 system review of symptoms, the patient complains only of the following symptoms, and all other reviewed systems are negative.  See history of present illness  ALLERGIES: Allergies  Allergen Reactions  . Latex Hives  . Raspberry Hives, Itching and Swelling  . Vioxx [Rofecoxib] Hives    HOME MEDICATIONS: Outpatient Prescriptions Prior to Visit  Medication Sig Dispense Refill  . albuterol (PROVENTIL HFA;VENTOLIN HFA) 108 (90 BASE) MCG/ACT inhaler Inhale 1-2 puffs into the lungs every 6 (six) hours as needed for wheezing or shortness of breath. 1 Inhaler 0  . ibuprofen (ADVIL,MOTRIN) 600 MG tablet Take 1 tablet (600 mg total) by mouth every 6 (six) hours as needed. 30 tablet 0  . propranolol (INDERAL) 60 MG tablet Take 1 tablet (60 mg total) by mouth 2 (two) times daily. 60 tablet 12  . albuterol (PROVENTIL) (2.5 MG/3ML) 0.083% nebulizer solution Take 3 mLs (2.5 mg total) by nebulization every 6 (six) hours as needed  for wheezing or shortness of breath. 75 mL 1  . norgestimate-ethinyl estradiol (SPRINTEC 28) 0.25-35 MG-MCG tablet Take 1 tablet by mouth daily. (Patient not taking: Reported on 02/29/2016) 28 tablet 10  . traMADol (ULTRAM) 50 MG tablet Take 1 tablet (50 mg total) by mouth every 6 (six) hours as needed. (Patient not taking: Reported on 02/29/2016)  10 tablet 0   No facility-administered medications prior to visit.    PAST MEDICAL HISTORY: Past Medical History  Diagnosis Date  . Irritable bowel   . Asthma     meds as needed  . Ectopic pregnancy     requiring surgery  . Gonorrhea   . Preterm labor   . Arthritis   . Chronic back pain   . Migraine headache   . Seizures (HCC)     fever induced during childhood  . Elevated liver enzymes     PAST SURGICAL HISTORY: Past Surgical History  Procedure Laterality Date  . Wisdom tooth extraction    . Tubal ligation  2005  . Laparoscopy  05/30/2012    Procedure: LAPAROSCOPY OPERATIVE;  Surgeon: Osborne Oman, MD;  Location: [facility];  Service: Gynecology;  Laterality: N/A;  Operative laparoscopy right salpingectomy and removal of ectopic pregnancy  . Salpingectomy    . Laparoscopy for ectopic pregnancy    . Dilitation & currettage/hystroscopy with novasure ablation N/A 02/20/2016    Procedure: HYSTEROSCOPY WITH NOVASURE ABLATION;  Surgeon: [name], MD;  Location: [facility];  Service: Gynecology;  Laterality: N/A;    FAMILY HISTORY: Family History  Problem Relation Age of Onset  . Asthma Brother     SOCIAL HISTORY: Social History   Social History  . Marital Status: Married    Spouse Name: N/A  . Number of Children: 2  . Years of Education: GED   Occupational History  .      [employer]    Social History Main Topics  . Smoking status: Current Every Day Smoker -- 0.25 packs/day for 20 years    Types: Cigarettes  . Smokeless tobacco: Never Used  . Alcohol Use: Yes     Comment: occasional  . Drug Use: No  . Sexual Activity: Yes    Birth Control/ Protection: Surgical   Other Topics Concern  . Not on file   Social History Narrative   Patient lives at home with her husband and children. Patient works full time at [employer].   Right handed   Education GED   One cup of caffeine daily               PHYSICAL EXAM  Filed Vitals:   03/07/16 0941  BP:  139/94  Pulse: 73  Resp: 16  Height: 5\' 4"  (1.626 m)  Weight: 195 lb (88.451 kg)   Body mass index is 33.46 kg/(m^2).  Generalized: Well developed, in no acute distress   Neurological examination  Mentation: Alert oriented to time, place, history taking. Follows all commands speech and language fluent Cranial nerve II-XII: Pupils were equal round reactive to light. Extraocular movements were full, visual field were full on confrontational test. Facial sensation and strength were normal. Uvula tongue midline. Head turning and shoulder shrug  were normal and symmetric. Motor: The motor testing reveals 5 over 5 strength of all 4 extremities. Good symmetric motor tone is noted throughout.  Sensory: Sensory testing is intact to soft touch on all 4 extremities. No evidence of extinction is noted.  Coordination: Cerebellar testing reveals good finger-nose-finger and heel-to-shin  bilaterally.  Gait and station: Gait is normal. Tandem gait is normal. Romberg is negative. No drift is seen.  Reflexes: Deep tendon reflexes are symmetric and normal bilaterally.   DIAGNOSTIC DATA (LABS, IMAGING, TESTING) - I reviewed patient records, labs, notes, testing and imaging myself where available.  Lab Results  Component Value Date   WBC 6.8 02/20/2016   HGB 12.8 02/20/2016   HCT 38.0 02/20/2016   MCV 86.8 02/20/2016   PLT 246 02/20/2016      Component Value Date/Time   NA 145 01/13/2016 2008   K 4.0 01/13/2016 2008   CL 112* 01/13/2016 2008   CO2 25 01/13/2016 2008   GLUCOSE 99 01/13/2016 2008   BUN 8 01/13/2016 2008   CREATININE 0.76 01/13/2016 2008   CALCIUM 9.2 01/13/2016 2008   PROT 7.1 01/13/2016 2008   ALBUMIN 3.6 01/13/2016 2008   AST 19 01/13/2016 2008   ALT 34 01/13/2016 2008   ALKPHOS 109 01/13/2016 2008   BILITOT 0.2* 01/13/2016 2008   GFRNONAA >60 01/13/2016 2008   GFRAA >60 01/13/2016 2008      ASSESSMENT AND PLAN 35 y.o. year old female  has a past medical history of  Irritable bowel; Asthma; Ectopic pregnancy; Gonorrhea; Preterm labor; Arthritis; Chronic back pain; Migraine headache; Seizures ([place]); and Elevated liver enzymes. here with:  1. Migraine  The patient will restart on nortriptyline taking 10 mg at bedtime for 1 week then increasing to 20 mg thereafter. She will also begin using Maxalt as needed for her migraine headaches. If the nortriptyline is not beneficial for reducing her headaches we can increase it or add on propranolol at that time. Patient is amenable to this plan. She will follow-up in 2 months or sooner if needed.     Ward Givens, MSN, NP-C 03/07/2016, 9:47 AM [facility] [address], [place], [place] [address] [phone]

## 2016-03-07 NOTE — Progress Notes (Signed)
I have reviewed and agreed above plan. 

## 2016-03-15 IMAGING — CT CT ANGIO CHEST
1 of 3 series · 18 of 33 positions shown · IV contrast (OMNIPAQUE 350)
Comparison: Radiographs earlier this day. Chest CT PE protocol
06/02/2012

CLINICAL DATA: Shortness of breath for 2 hr.  Chest tightness.

EXAM:
CT ANGIOGRAPHY CHEST WITH CONTRAST
TECHNIQUE: Multidetector CT imaging of the chest was performed using the
standard protocol during bolus administration of intravenous
contrast. Multiplanar CT image reconstructions and MIPs were
obtained to evaluate the vascular anatomy.
CONTRAST:  100mL OMNIPAQUE IOHEXOL 350 MG/ML SOLN

[Series 6: thins for pacs · axial · 0.79mm/px · z∈[-159,+29]mm · 18 of 216 slices shown]
[im 14/216  lung]
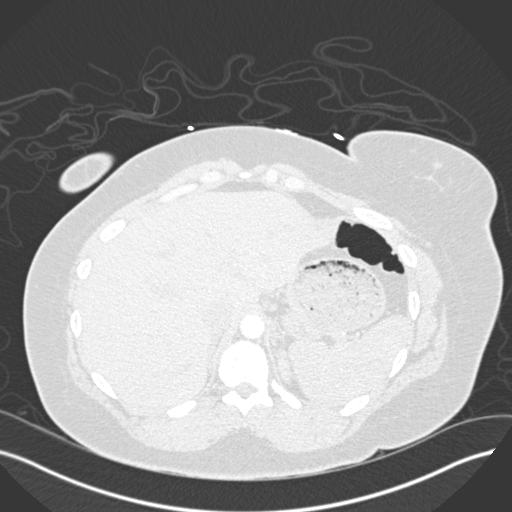
[im 27/216  mediastinal]
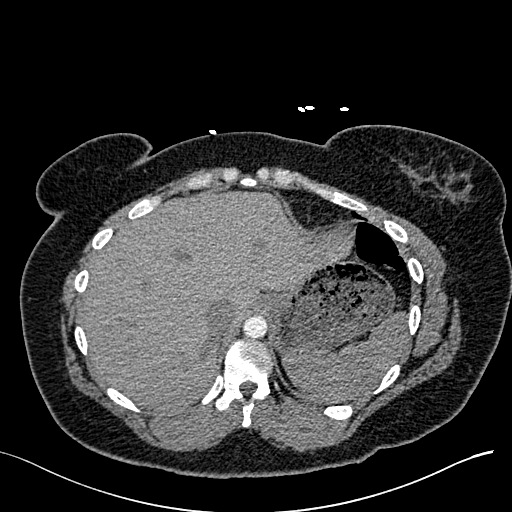
[im 41/216  lung]
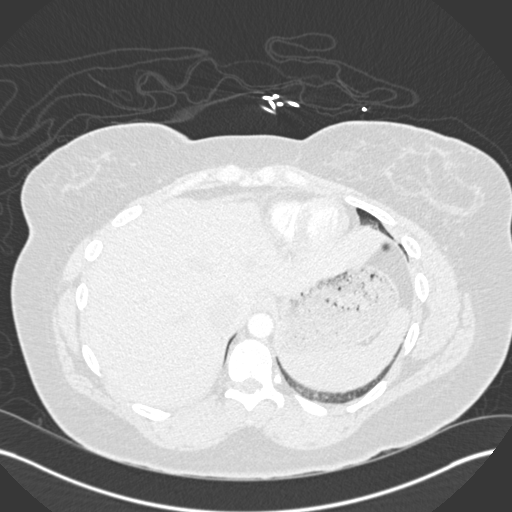
[im 54/216  mediastinal]
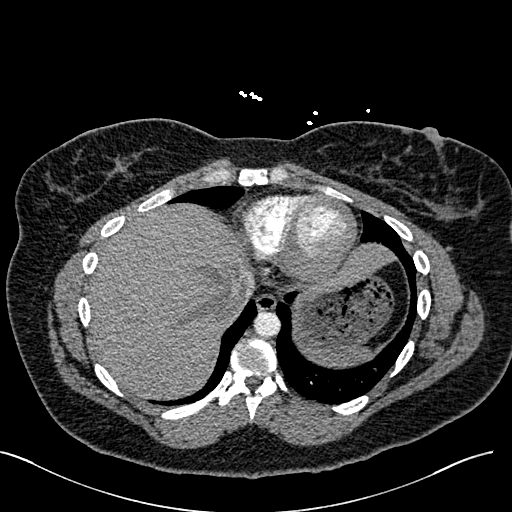
[im 68/216  lung]
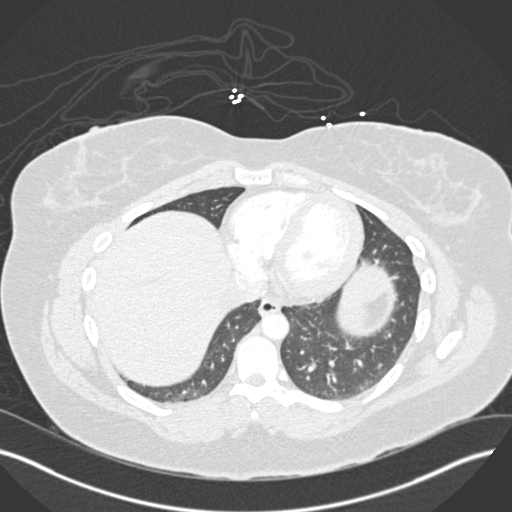
[im 72/216  mediastinal]
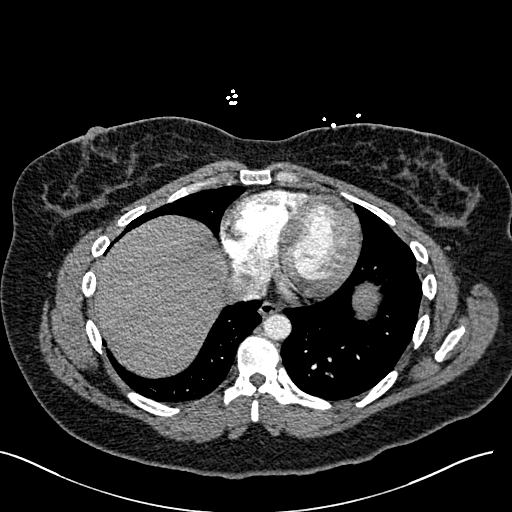
[im 81/216  lung]
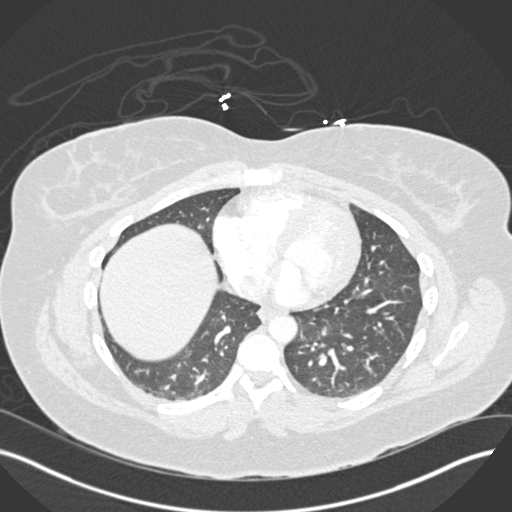
[im 95/216  mediastinal]
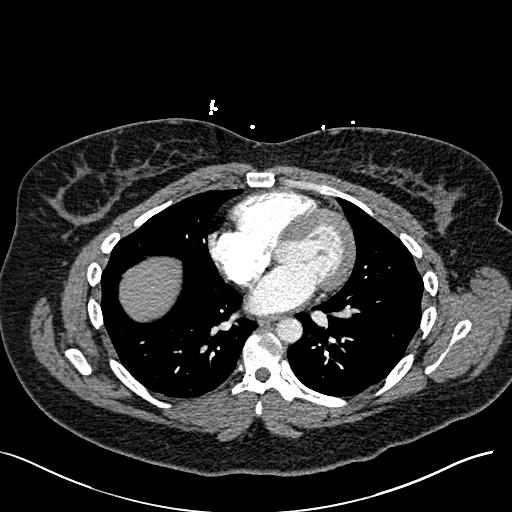
[im 99/216  lung]
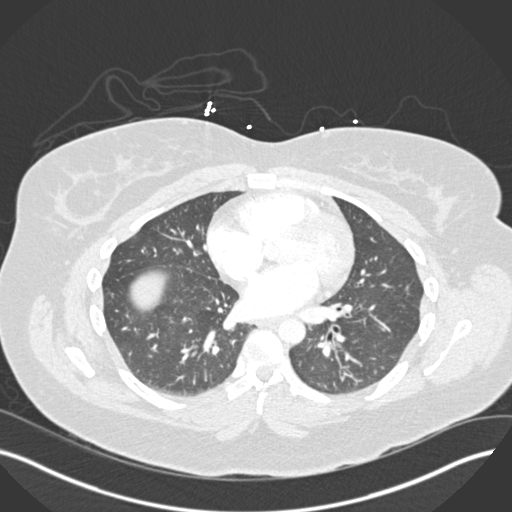
[im 108/216  mediastinal]
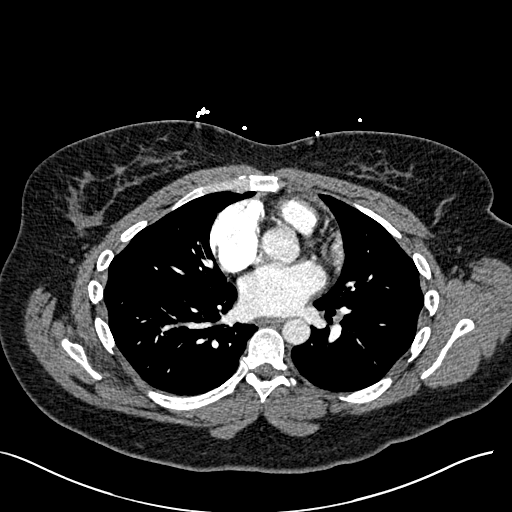
[im 121/216  lung]
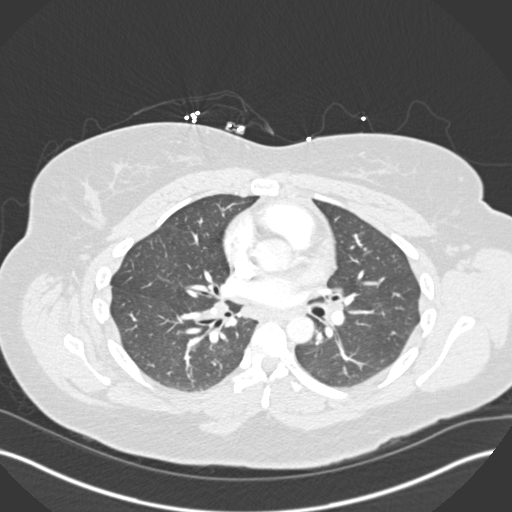
[im 135/216  mediastinal]
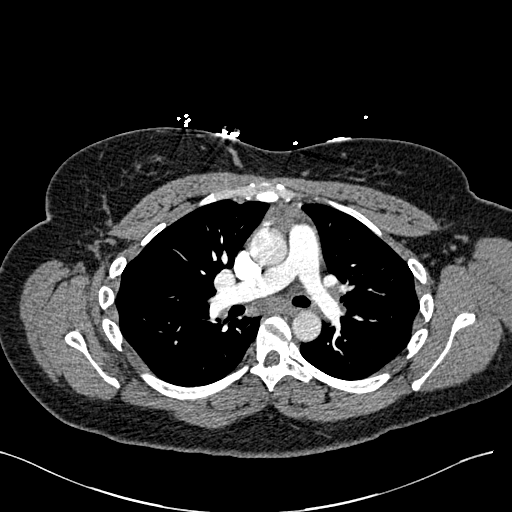
[im 144/216  lung]
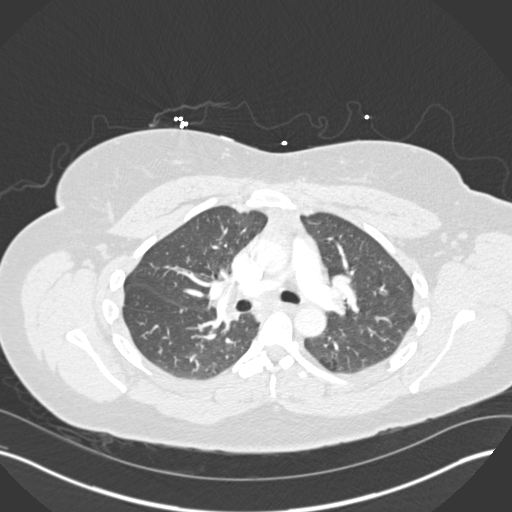
[im 148/216  mediastinal]
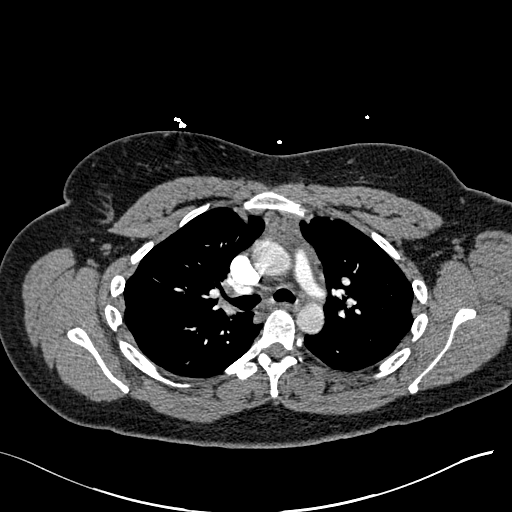
[im 162/216  lung]
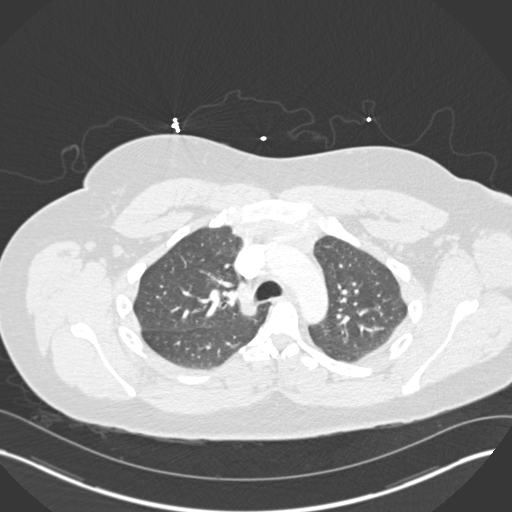
[im 175/216  mediastinal]
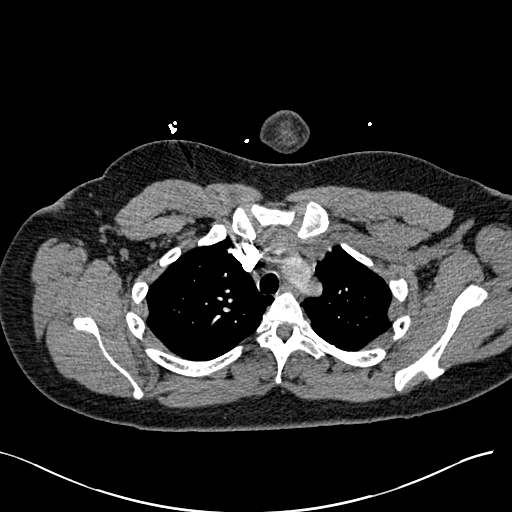
[im 189/216  lung]
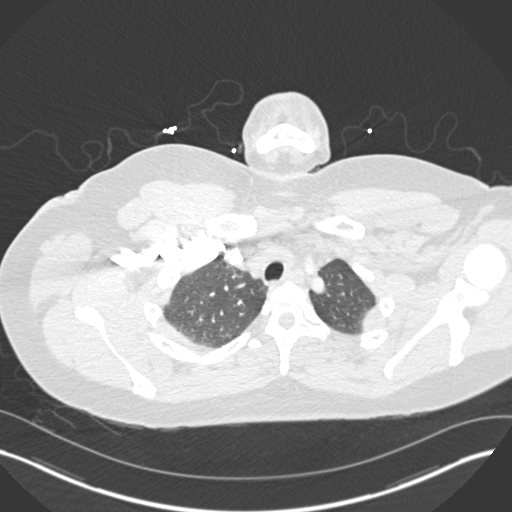
[im 202/216  mediastinal]
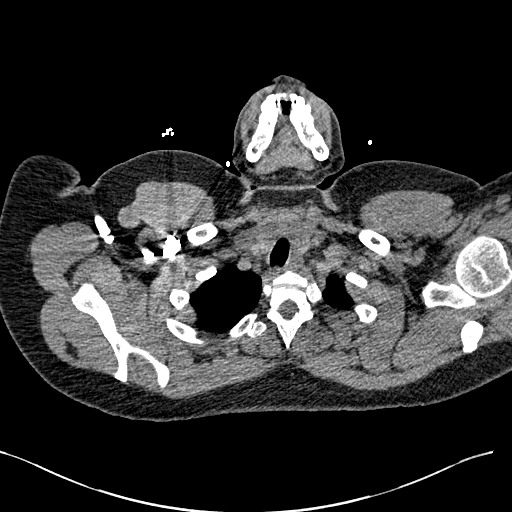

[18 of 33 positions shown; findings below may reference images not displayed]

FINDINGS: There are no filling defects within the pulmonary arteries to
suggest pulmonary embolus.

The thoracic aorta is normal in caliber. No pleural or pericardial
effusion. No mediastinal or hilar adenopathy.

Mild bronchial thickening and minimal bibasilar atelectasis. No
consolidation, pulmonary nodule or mass.

No acute abnormality in the included upper abdomen. There are no
acute or suspicious osseous abnormalities.

Review of the MIP images confirms the above findings.
IMPRESSION: 1. No pulmonary embolus.
2. Central bronchial thickening, may reflect bronchitis or asthma.

## 2016-03-17 IMAGING — CR DG CHEST 2V
2 series · 2 of 2 positions shown · non-contrast
Comparison: Radiographs and CT 03/20/2015

CLINICAL DATA: Worsening cough and chest pain, recent diagnosis of
bronchitis.

EXAM:
CHEST  2 VIEW

[w chest pa]
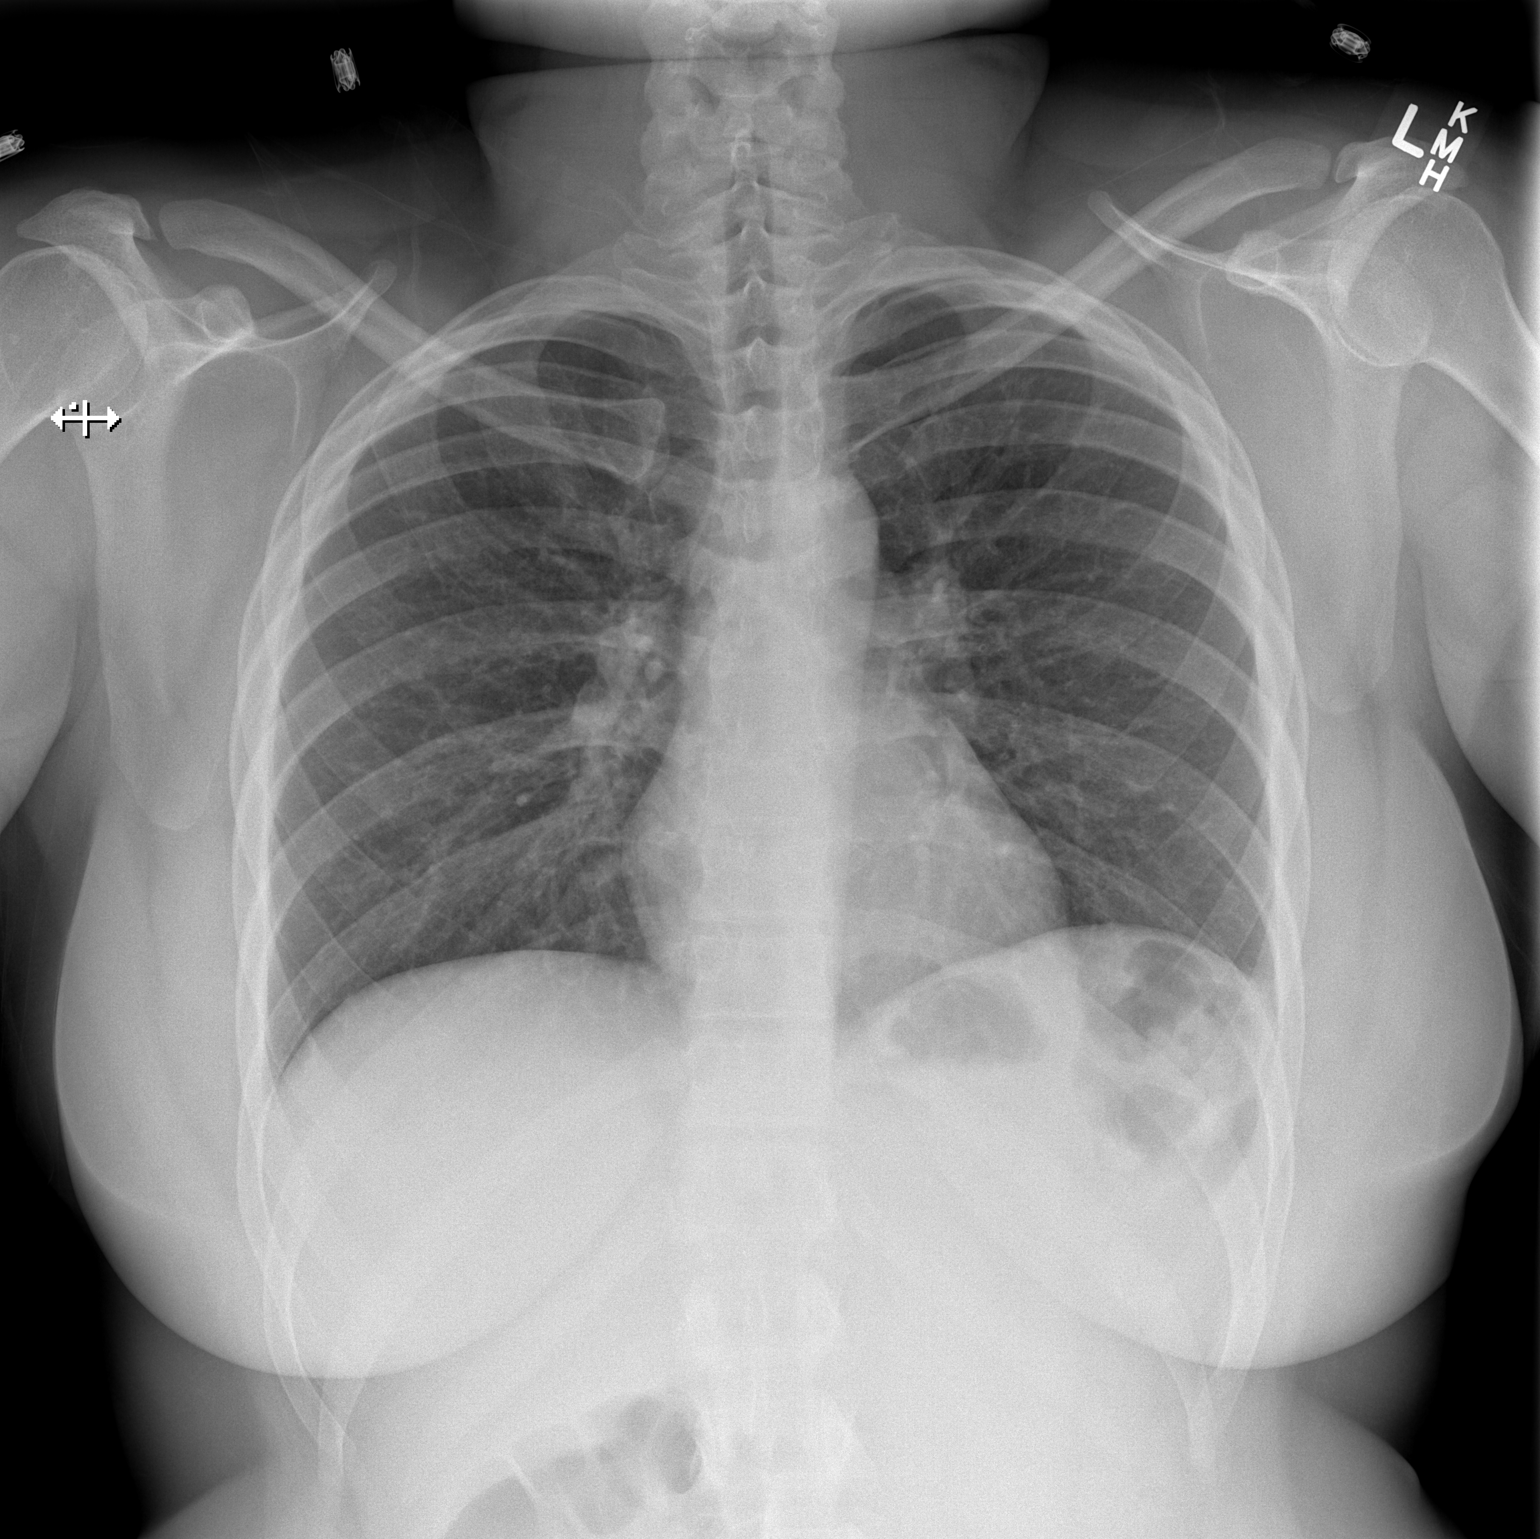

[w chest lat]
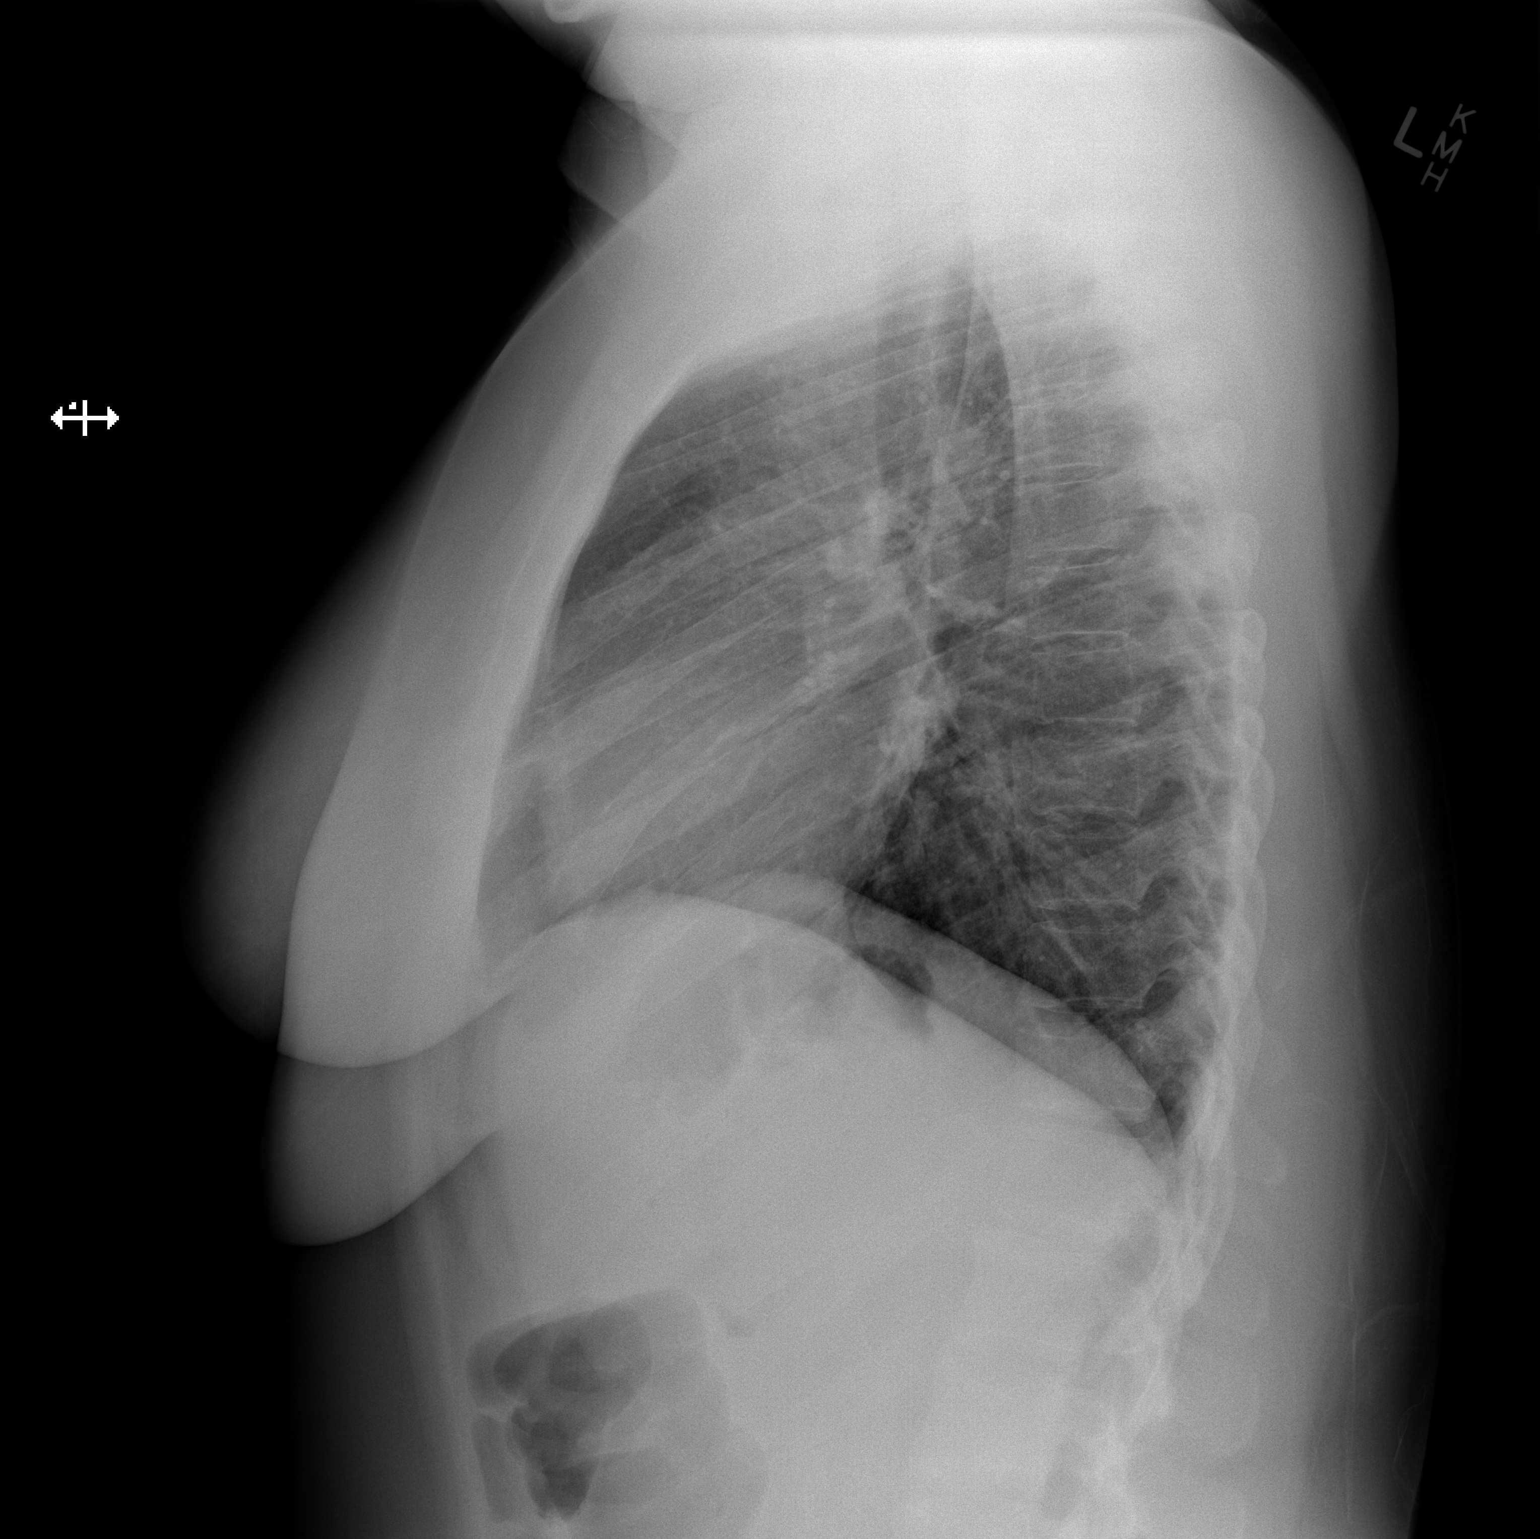

[2 of 2 positions shown; findings below may reference images not displayed]

FINDINGS: Central bronchial thickening, mildly progressed from prior. No
developing consolidation. The cardiomediastinal contours are normal.
Pulmonary vasculature is normal. No pleural effusion or
pneumothorax. No acute osseous abnormalities are seen.
IMPRESSION: Central bronchial thickening, mildly progressed from prior. No
localizing pulmonary process.

## 2016-05-08 ENCOUNTER — Encounter: Payer: Self-pay | Admitting: Adult Health

## 2016-05-08 ENCOUNTER — Ambulatory Visit (INDEPENDENT_AMBULATORY_CARE_PROVIDER_SITE_OTHER): Payer: Medicaid Other | Admitting: Adult Health

## 2016-05-08 VITALS — BP 127/89 | HR 63 | Ht 64.0 in | Wt 196.0 lb

## 2016-05-08 DIAGNOSIS — G43019 Migraine without aura, intractable, without status migrainosus: Secondary | ICD-10-CM | POA: Diagnosis not present

## 2016-05-08 MED ORDER — NORTRIPTYLINE HCL 10 MG PO CAPS: 30.0000 mg | ORAL_CAPSULE | Freq: Every day | ORAL | Status: DC

## 2016-05-08 NOTE — Patient Instructions (Signed)
Increase nortriptyline to 30 mg at bedtime Continue maxalt If your symptoms worsen or you develop new symptoms please let us know.

## 2016-05-08 NOTE — Progress Notes (Signed)
PATIENT: [name] DOB: 11/12/1981  REASON FOR VISIT: follow up- headaches HISTORY FROM: patient  HISTORY OF PRESENT ILLNESS: Today 05/08/2016: [name] is a 35 year old female with a history of headaches. She returns today for follow-up. She was started on nortriptyline she reports that her headaches may have improved slightly. She continues to have 5 or 6 headaches a month. Her headaches are typically located in the left occipital region or in the top of the head. She states that when her headaches are severe it is a sharp stabbing pain in the top of the head. She knows that the sunlight can trigger her migraines. She does have photophobia and phonophobia as well as nausea and vomiting. She has been using the Maxalt however he does not resolve the headache but does take the edge off. She has not been taking Maxalt at the onset of a migraine. The patient's headaches can sometimes last as long as one week. She denies any new symptoms she returns today for an evaluation.   03/07/2016 (MM): [name] is a 35 year old female with a history of headaches. She returns today for an evaluation. The patient has not been seen in this office in 2 years. The patient was having sharp stabbing headaches associated with intercourse however her headaches transformed to daily. She was on nortriptyline and propranolol at one time. She reports that this was working well for her. She states that she has not been taking propranolol since December. She states that she ran out of the medication and did not come in for an office visit. She states that she continued on the nortriptyline however she ran out of this medication 2 days ago. Her headaches are normally located in the center of the head and she describes it as a sharp stabbing pain this will occasionally moved to the frontal and then the temporal regions. She states that she has 2-3 sharp shooting headaches a week but she will always have a dull  headache. She reports photophobia and phonophobia as well as nausea and vomiting with her headaches. She states that noise is a trigger for headaches. 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 She returns today for an evaluation.  HISTORY (YAN):  [name] is a 35 years old right-handed African American female, referred by her primary care physician Dr. Criss Rosales for evaluation of headaches  Since 2005, she has headache associated with intercourse, she only has headaches after orgasm exploding, pounding, lasting 10-15 minutes, gradually subsided, over the past 6 months, she has worsening headaches, severe pounding headaches after intercourse, and also lingers, lasting 2-3 days. 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  She is now having headache more frequent, not associated with intercourse, Almost daily, it will not go away, stabbing pain at her left occipital regions, left eye jumpy pain, lasting on and off all day.  She is taking ibuprofen 800 mg, Maxalt as needed, which only take only temporarily, headache usually come back the same day again she is also taking Inderal 40 mg as preventive medications, no significant side effect   REVIEW OF SYSTEMS: Out of a complete 14 system review of symptoms, the patient complains  only of the following symptoms, and all other reviewed systems are negative.  See history of present illness  ALLERGIES: Allergies  Allergen Reactions  . Latex Hives  . Raspberry Hives, Itching and Swelling  . Vioxx [Rofecoxib] Hives    HOME MEDICATIONS: Outpatient Prescriptions Prior to Visit  Medication Sig Dispense Refill  . albuterol (PROVENTIL  HFA;VENTOLIN HFA) 108 (90 BASE) MCG/ACT inhaler Inhale 1-2 puffs into the lungs every 6 (six) hours as needed for wheezing or shortness of breath. 1 Inhaler 0  . ibuprofen (ADVIL,MOTRIN) 600 MG tablet Take 1 tablet (600 mg total) by mouth every 6 (six) hours as needed. 30 tablet 0  . rizatriptan (MAXALT-MLT) 10 MG disintegrating tablet Take 1 tablet (10 mg total) by mouth as needed for migraine. May repeat in 2 hours if needed 15 tablet 11  . nortriptyline (PAMELOR) 10 MG capsule Take 1 tab PO at bedtime for one week then increase to 2 tablets at bedtime thereafter 60 capsule 5   No facility-administered medications prior to visit.    PAST MEDICAL HISTORY: Past Medical History  Diagnosis Date  . Irritable bowel   . Asthma     meds as needed  . Ectopic pregnancy     requiring surgery  . Gonorrhea   . Preterm labor   . Arthritis   . Chronic back pain   . Migraine headache   . Seizures ([facility])     fever induced during childhood  . Elevated liver enzymes     PAST SURGICAL HISTORY: Past Surgical History  Procedure Laterality Date  . Wisdom tooth extraction    . Tubal ligation  2005  . Laparoscopy  05/30/2012    Procedure: LAPAROSCOPY OPERATIVE;  Surgeon: Osborne Oman, MD;  Location: [facility];  Service: Gynecology;  Laterality: N/A;  Operative laparoscopy right salpingectomy and removal of ectopic pregnancy  . Salpingectomy    . Laparoscopy for ectopic pregnancy    . Dilitation & currettage/hystroscopy with novasure ablation N/A 02/20/2016    Procedure: HYSTEROSCOPY WITH NOVASURE ABLATION;  Surgeon: [name], MD;  Location: [facility];  Service: Gynecology;  Laterality: N/A;    FAMILY HISTORY: Family History  Problem Relation Age of Onset  . Asthma Brother     SOCIAL HISTORY: Social History   Social History  . Marital Status: Married    Spouse Name: N/A  . Number of Children: 2  . Years of Education: GED   Occupational History  .      [employer]    Social  History Main Topics  . Smoking status: Former Smoker -- 0.25 packs/day for 20 years    Types: Cigarettes    Quit date: 05/04/2016  . Smokeless tobacco: Never Used  . Alcohol Use: 0.6 oz/week    1 Glasses of wine per week     Comment: occasional  . Drug Use: No  . Sexual Activity: Yes    Birth Control/ Protection: Surgical   Other Topics Concern  . Not on file   Social History Narrative   Patient lives at home with her husband and children. Patient works full time at [employer].   Right handed   Education GED   One cup of caffeine daily               PHYSICAL EXAM  Filed Vitals:   05/08/16 0841  BP: 127/89  Pulse: 63  Height: 5\' 4"  (1.626 m)  Weight: 196 lb (88.905 kg)   Body mass index is 33.63 kg/(m^2).  Generalized: Well developed, in no acute distress   Neurological examination  Mentation: Alert oriented to time, place, history taking. Follows all commands speech and language fluent Cranial nerve II-XII: Pupils were equal round reactive to light. Extraocular movements were full, visual field were full on confrontational test. Facial sensation and strength were normal. Uvula tongue midline. Head turning and shoulder shrug  were normal and symmetric. Motor: The motor testing reveals 5 over 5 strength of all 4 extremities. Good symmetric motor tone is noted throughout.  Sensory: Sensory testing is intact to soft touch on all 4 extremities. No evidence of extinction is noted.  Coordination: Cerebellar testing reveals good finger-nose-finger and heel-to-shin bilaterally.  Gait and station: Gait is normal. Tandem gait is normal. Romberg is negative. No drift is seen.  Reflexes: Deep tendon reflexes are symmetric and normal bilaterally.   DIAGNOSTIC DATA (LABS, IMAGING, TESTING) - I reviewed patient records, labs, notes, testing and imaging myself where available.  Lab Results  Component Value Date   WBC 6.8 02/20/2016   HGB 12.8 02/20/2016   HCT 38.0 02/20/2016   MCV  86.8 02/20/2016   PLT 246 02/20/2016      Component Value Date/Time   NA 145 01/13/2016 2008   K 4.0 01/13/2016 2008   CL 112* 01/13/2016 2008   CO2 25 01/13/2016 2008   GLUCOSE 99 01/13/2016 2008   BUN 8 01/13/2016 2008   CREATININE 0.76 01/13/2016 2008   CALCIUM 9.2 01/13/2016 2008   PROT 7.1 01/13/2016 2008   ALBUMIN 3.6 01/13/2016 2008   AST 19 01/13/2016 2008   ALT 34 01/13/2016 2008   ALKPHOS 109 01/13/2016 2008   BILITOT 0.2* 01/13/2016 2008   GFRNONAA >60 01/13/2016 2008   GFRAA >60 01/13/2016 2008      ASSESSMENT AND PLAN 35 y.o. year old female  has a past medical history of Irritable bowel; Asthma; Ectopic pregnancy; Gonorrhea; Preterm labor; Arthritis; Chronic back pain; Migraine headache; Seizures ([place]); and Elevated liver enzymes. here with:  1. Migraine headache  The patient continues to have frequent headaches each month. We will increase nortriptyline to 30 mg at bedtime. She will continue using Maxalt. I explained the Maxalt is intended to be taken at the onset of a headache and repeated 2 hours later. She verbalized understanding. Patient advised that if her headache frequency and severity does not improve she should let us know. She will follow-up in 3 months or sooner if needed.     Ward Givens, MSN, NP-C 05/08/2016, 9:06 AM [facility] [address], [place],  [address] [phone]

## 2016-05-08 NOTE — Progress Notes (Signed)
I have reviewed and agreed above plan. 

## 2016-08-06 ENCOUNTER — Ambulatory Visit (INDEPENDENT_AMBULATORY_CARE_PROVIDER_SITE_OTHER): Payer: Medicaid Other | Admitting: Adult Health

## 2016-08-06 ENCOUNTER — Encounter: Payer: Self-pay | Admitting: Adult Health

## 2016-08-06 VITALS — BP 133/93 | HR 67 | Ht 64.0 in | Wt 198.4 lb

## 2016-08-06 DIAGNOSIS — G43009 Migraine without aura, not intractable, without status migrainosus: Secondary | ICD-10-CM

## 2016-08-06 NOTE — Progress Notes (Signed)
I have reviewed and agreed above plan. 

## 2016-08-06 NOTE — Progress Notes (Signed)
PATIENT: [name] DOB: 07/23/1981  REASON FOR VISIT: follow up HISTORY FROM: patient  HISTORY OF PRESENT ILLNESS:   HISTORY [name]):  [name] is a 35 years old right-handed African American female, referred by her primary care physician Dr. Criss Rosales for evaluation of headaches  Since 2005, she has headache associated with intercourse, she only has headaches after orgasm exploding, pounding, lasting 10-15 minutes, gradually subsided, over the past 6 months, she has worsening headaches, severe pounding headaches after intercourse, and also lingers, lasting 2-3 days. 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  She is now having headache more frequent, not associated with intercourse, Almost daily, it will not go away, stabbing pain at her left occipital regions, left eye jumpy pain, lasting on and off all day.  She is taking ibuprofen 800 mg, Maxalt as needed, which only take only temporarily, headache usually come back the same day again she is also taking Inderal 40 mg as preventive medications, no significant side effect    Update 05/08/2016: [name] is a 35 year old female with a history of headaches. She returns today for follow-up. She was started on nortriptyline she reports that her headaches may have improved slightly. She continues to have 5 or 6 headaches a month. Her headaches are typically located in the left occipital region or in the top of the head. She states that when her headaches are severe it is a sharp stabbing pain in the top of the head. She knows that the sunlight can trigger her migraines. She does have photophobia and phonophobia as well as nausea and vomiting. She has been using the  Maxalt however he does not resolve the headache but does take the edge off. She has not been taking Maxalt at the onset of a migraine. The patient's headaches can sometimes last as long as one week. She denies any new symptoms she returns today for an evaluation.  Today 08/06/2016:                                                                                                                                   [name] is a 35 year old female with a history of migraine headaches. She returns today for follow-up. At the last visit her nortriptyline was increased. She states that she did increase it but did not find any benefit. She states that she is no longer on the medication. She feels that her migraines are "spiritual." She states that when she gets a migraine she feels that when the "Reita Cliche is speaking to her." She states her headaches always occur when she is around some when she needs to minister to. She states  that when she listens administers that worsen the headache does resolve. She denies any new neurological symptoms. Returns today for an evaluation.                                                                                                                                  REVIEW OF SYSTEMS: Out of a complete 14 system review of symptoms, the patient complains only of the following symptoms, and all other reviewed systems are negative.  Joint swelling, daytime sleepiness, snoring, leg swelling, nervous/anxious  ALLERGIES: Allergies  Allergen Reactions  . Latex Hives  . Raspberry Hives, Itching and Swelling  . Vioxx [Rofecoxib] Hives    HOME MEDICATIONS: Outpatient Medications Prior to Visit  Medication Sig Dispense Refill  . albuterol (PROVENTIL HFA;VENTOLIN HFA) 108 (90 BASE) MCG/ACT inhaler Inhale 1-2 puffs into the lungs every 6 (six) hours as needed for wheezing or shortness of breath. 1 Inhaler 0  . ibuprofen (ADVIL,MOTRIN) 600 MG tablet Take 1 tablet (600 mg total) by mouth  every 6 (six) hours as needed. 30 tablet 0  . nortriptyline (PAMELOR) 10 MG capsule Take 3 capsules (30 mg total) by mouth at bedtime. 90 capsule 5  . rizatriptan (MAXALT-MLT) 10 MG disintegrating tablet Take 1 tablet (10 mg total) by mouth as needed for migraine. May repeat in 2 hours if needed 15 tablet 11   No facility-administered medications prior to visit.     PAST MEDICAL HISTORY: Past Medical History:  Diagnosis Date  . Arthritis   . Asthma    meds as needed  . Chronic back pain   . Ectopic pregnancy    requiring surgery  . Elevated liver enzymes   . Gonorrhea   . Irritable bowel   . Migraine headache   . Preterm labor   . Seizures ([place])    fever induced during childhood    PAST SURGICAL HISTORY: Past Surgical History:  Procedure Laterality Date  . DILITATION & CURRETTAGE/HYSTROSCOPY WITH NOVASURE ABLATION N/A 02/20/2016   Procedure: HYSTEROSCOPY WITH NOVASURE ABLATION;  Surgeon: [name], MD;  Location: [facility];  Service: Gynecology;  Laterality: N/A;  . LAPAROSCOPY  05/30/2012   Procedure: LAPAROSCOPY OPERATIVE;  Surgeon: Osborne Oman, MD;  Location: [facility];  Service: Gynecology;  Laterality: N/A;  Operative laparoscopy right salpingectomy and removal of ectopic pregnancy  . LAPAROSCOPY FOR ECTOPIC PREGNANCY    . SALPINGECTOMY    . TUBAL LIGATION  2005  . WISDOM TOOTH EXTRACTION      FAMILY HISTORY: Family History  Problem Relation Age of Onset  . Asthma Brother     SOCIAL HISTORY: Social History   Social History  . Marital status: Married    Spouse name: N/A  . Number of children: 2  . Years of education: GED   Occupational History  .  [employer]    Social History Main Topics  . Smoking status: Former Smoker  Packs/day: 0.25    Years: 20.00    Types: Cigarettes    Quit date: 05/04/2016  . Smokeless tobacco: Never Used  . Alcohol use 0.6 oz/week    1 Glasses of wine per week     Comment: occasional  . Drug use: No  .  Sexual activity: Yes    Birth control/ protection: Surgical   Other Topics Concern  . Not on file   Social History Narrative   Patient lives at home with her husband and children. Patient works full time at [employer].   Right handed   Education GED   One cup of caffeine daily               PHYSICAL EXAM  Vitals:   08/06/16 0855  BP: (!) 133/93  Pulse: 67  Weight: 198 lb 6.4 oz (90 kg)  Height: 5\' 4"  (1.626 m)   Body mass index is 34.06 kg/m.  Generalized: Well developed, in no acute distress   Neurological examination  Mentation: Alert oriented to time, place, history taking. Follows all commands speech and language fluent Cranial nerve II-XII: Pupils were equal round reactive to light. Extraocular movements were full, visual field were full on confrontational test. Facial sensation and strength were normal. Uvula tongue midline. Head turning and shoulder shrug  were normal and symmetric. Motor: The motor testing reveals 5 over 5 strength of all 4 extremities. Good symmetric motor tone is noted throughout.  Sensory: Sensory testing is intact to soft touch on all 4 extremities. No evidence of extinction is noted.  Coordination: Cerebellar testing reveals good finger-nose-finger and heel-to-shin bilaterally.  Gait and station: Gait is normal. Tandem gait is normal. Romberg is negative. No drift is seen.  Reflexes: Deep tendon reflexes are symmetric and normal bilaterally.   DIAGNOSTIC DATA (LABS, IMAGING, TESTING) - I reviewed patient records, labs, notes, testing and imaging myself where available.  Lab Results  Component Value Date   WBC 6.8 02/20/2016   HGB 12.8 02/20/2016   HCT 38.0 02/20/2016   MCV 86.8 02/20/2016   PLT 246 02/20/2016      Component Value Date/Time   NA 145 01/13/2016 2008   K 4.0 01/13/2016 2008   CL 112 (H) 01/13/2016 2008   CO2 25 01/13/2016 2008   GLUCOSE 99 01/13/2016 2008   BUN 8 01/13/2016 2008   CREATININE 0.76 01/13/2016 2008    CALCIUM 9.2 01/13/2016 2008   PROT 7.1 01/13/2016 2008   ALBUMIN 3.6 01/13/2016 2008   AST 19 01/13/2016 2008   ALT 34 01/13/2016 2008   ALKPHOS 109 01/13/2016 2008   BILITOT 0.2 (L) 01/13/2016 2008   GFRNONAA >60 01/13/2016 2008   GFRAA >60 01/13/2016 2008      ASSESSMENT AND PLAN 35 y.o. year old female  has a past medical history of Arthritis; Asthma; Chronic back pain; Ectopic pregnancy; Elevated liver enzymes; Gonorrhea; Irritable bowel; Migraine headache; Preterm labor; and Seizures ([place]). here with:  1. Migraine headache  The patient feels that her migraines are spiritual. She is currently not on any treatment. She does not want to try medication at this time. She will follow-up in 6 months or sooner if needed.     Ward Givens, MSN, NP-C 08/06/2016, 9:00 AM [facility] [address], [place], [place] [address] [phone]

## 2016-08-06 NOTE — Patient Instructions (Signed)
If your symptoms worsen or you develop new symptoms please let us know.   

## 2016-09-26 ENCOUNTER — Ambulatory Visit (INDEPENDENT_AMBULATORY_CARE_PROVIDER_SITE_OTHER): Payer: Medicaid Other | Admitting: Obstetrics & Gynecology

## 2016-09-26 ENCOUNTER — Encounter: Payer: Self-pay | Admitting: Obstetrics & Gynecology

## 2016-09-26 ENCOUNTER — Other Ambulatory Visit (HOSPITAL_COMMUNITY)
Admission: RE | Admit: 2016-09-26 | Discharge: 2016-09-26 | Disposition: A | Discharge status: Home / Self Care | Payer: Medicaid Other | Source: Ambulatory Visit | Attending: Obstetrics & Gynecology | Admitting: Obstetrics & Gynecology

## 2016-09-26 VITALS — BP 133/79 | HR 72 | Ht 64.0 in | Wt 197.0 lb

## 2016-09-26 DIAGNOSIS — Z113 Encounter for screening for infections with a predominantly sexual mode of transmission: Secondary | ICD-10-CM | POA: Insufficient documentation

## 2016-09-26 DIAGNOSIS — N939 Abnormal uterine and vaginal bleeding, unspecified: Secondary | ICD-10-CM

## 2016-09-26 NOTE — Progress Notes (Signed)
History:  35 y.o. [id number] here today for eval of AUB.  Pt reports h/o endometrial ablation 02/20/2016.  She reports that she was recently informed that her spouse was unfaithful 'again'  She is now having intermittent bleeding and is worried that it is related to an STI.   She reports that she will have no bleeding or `2 months and then have prolonged bleeding. She reports 12 days nof bleeding prev.  She denies bleeding presently. She received a call from a contact of her husbands that she should be checked for STI's but she does not know what type.   The following portions of the patient's history were reviewed and updated as appropriate: allergies, current medications, past family history, past medical history, past social history, past surgical history and problem list.  Review of Systems:  Pertinent items are noted in HPI.  Objective:  Physical Exam Blood pressure 133/79, pulse 72, height 5\' 4"  (1.626 m), weight 197 lb (89.4 kg). Gen: NAD Abd: Soft, nontender and nondistended Pelvic: Normal appearing external genitalia; normal appearing vaginal mucosa and cervix.  Normal discharge.  Small uterus, no other palpable masses, no uterine or adnexal tenderness  Labs and Imaging No results found.  Assessment & Plan:  AUB.  Pt with h/o endometrial ablation.  Also with h/o STI and possible new exposure  F/u cx: GC and chlamydia and [place] Labs: HIV, RPR and Hep C Pt will keep menstrual calendar for 2-3 months and f/u She reports that she will be abstinent until that time  Total face-to-face time with patient was 25 min.  Greater than 50% was spent in counseling and coordination of care with the patient. We discussed transmission of STI's; how she is dealing with her spouse and AUB     Mico Spark L. Harraway-Smith, M.D., [name]

## 2016-09-27 LAB — GC/CHLAMYDIA PROBE AMP (~~LOC~~) NOT AT ARMC
Chlamydia: NEGATIVE
Neisseria Gonorrhea: NEGATIVE

## 2016-09-27 LAB — HIV ANTIBODY (ROUTINE TESTING W REFLEX): HIV 1&2 Ab, 4th Generation: NONREACTIVE

## 2016-09-27 LAB — WET PREP, GENITAL
TRICH WET PREP: NONE SEEN
YEAST WET PREP: NONE SEEN

## 2016-09-27 LAB — HEPATITIS B SURFACE ANTIGEN: Hepatitis B Surface Ag: NEGATIVE

## 2016-09-27 LAB — RPR

## 2016-10-01 ENCOUNTER — Other Ambulatory Visit: Payer: Self-pay | Admitting: Obstetrics & Gynecology

## 2016-10-01 ENCOUNTER — Telehealth: Payer: Self-pay

## 2016-10-01 DIAGNOSIS — B9689 Other specified bacterial agents as the cause of diseases classified elsewhere: Secondary | ICD-10-CM

## 2016-10-01 DIAGNOSIS — N760 Acute vaginitis: Principal | ICD-10-CM

## 2016-10-01 MED ORDER — METRONIDAZOLE 500 MG PO TABS: 500.0000 mg | ORAL_TABLET | Freq: Two times a day (BID) | ORAL | 0 refills | Status: DC

## 2016-10-01 NOTE — Telephone Encounter (Signed)
She has BV. Her cx and labs were otherwise WNL.   No answer or voicemail to leave a message.

## 2016-10-02 NOTE — Telephone Encounter (Signed)
Patient called and left message stating she is returning a call she missed from [place]. Called patient, no answer- left message stating we were trying to reach you but will go ahead and send you a mychart message so be on the lookout for that.

## 2017-01-06 ENCOUNTER — Emergency Department (HOSPITAL_COMMUNITY)
Admission: EM | Admit: 2017-01-06 | Discharge: 2017-01-06 | Disposition: A | Discharge status: Home / Self Care | Payer: Medicaid Other | Attending: Emergency Medicine | Admitting: Emergency Medicine

## 2017-01-06 ENCOUNTER — Encounter (HOSPITAL_COMMUNITY): Payer: Self-pay | Admitting: Emergency Medicine

## 2017-01-06 DIAGNOSIS — J45909 Unspecified asthma, uncomplicated: Secondary | ICD-10-CM | POA: Diagnosis not present

## 2017-01-06 DIAGNOSIS — G8929 Other chronic pain: Secondary | ICD-10-CM | POA: Insufficient documentation

## 2017-01-06 DIAGNOSIS — M5442 Lumbago with sciatica, left side: Secondary | ICD-10-CM | POA: Diagnosis not present

## 2017-01-06 DIAGNOSIS — M545 Low back pain: Secondary | ICD-10-CM | POA: Diagnosis present

## 2017-01-06 DIAGNOSIS — Z91040 Latex allergy status: Secondary | ICD-10-CM | POA: Diagnosis not present

## 2017-01-06 DIAGNOSIS — M5441 Lumbago with sciatica, right side: Secondary | ICD-10-CM | POA: Diagnosis not present

## 2017-01-06 DIAGNOSIS — Z87891 Personal history of nicotine dependence: Secondary | ICD-10-CM | POA: Diagnosis not present

## 2017-01-06 MED ORDER — HYDROCODONE-ACETAMINOPHEN 5-325 MG PO TABS: 1.0000 | ORAL_TABLET | Freq: Four times a day (QID) | ORAL | 0 refills | Status: DC | PRN

## 2017-01-06 MED ORDER — HYDROCODONE-ACETAMINOPHEN 5-325 MG PO TABS
2.0000 | ORAL_TABLET | Freq: Once | ORAL | Status: AC
  Administered 2017-01-06: 2 via ORAL
  Filled 2017-01-06: qty 2

## 2017-01-06 MED ORDER — PREDNISONE 10 MG (21) PO TBPK: ORAL_TABLET | ORAL | 0 refills | Status: DC

## 2017-01-06 MED ORDER — METHOCARBAMOL 500 MG PO TABS: 500.0000 mg | ORAL_TABLET | Freq: Two times a day (BID) | ORAL | 0 refills | Status: DC

## 2017-01-06 MED ORDER — KETOROLAC TROMETHAMINE 60 MG/2ML IM SOLN
60.0000 mg | Freq: Once | INTRAMUSCULAR | Status: AC
  Administered 2017-01-06: 60 mg via INTRAMUSCULAR
  Filled 2017-01-06: qty 2

## 2017-01-06 MED ORDER — LIDOCAINE 5 % EX PTCH: 1.0000 | MEDICATED_PATCH | CUTANEOUS | 0 refills | Status: DC

## 2017-01-06 MED ORDER — METHOCARBAMOL 500 MG PO TABS
1000.0000 mg | ORAL_TABLET | Freq: Once | ORAL | Status: AC
  Administered 2017-01-06: 1000 mg via ORAL
  Filled 2017-01-06: qty 2

## 2017-01-06 NOTE — ED Provider Notes (Signed)
[facility] DEPT Provider Note   CSN: [id number] Arrival date & time: 01/06/17  1207   By signing my name below, I, [name], attest that this documentation has been prepared under the direction and in the presence of [name], PA-C. Electronically Signed: [name], Education administrator. 01/06/17. 3:48 PM.  History   Chief Complaint Chief Complaint  Patient presents with  . Extremity Weakness    The history is provided by the patient. No language interpreter was used.     HPI Comments: [name] [name] is a 36 y.o. female who presents to the Emergency Department complaining of 2 weeks of persistent left sided neck pain that radiates upwards to her head and down into her LUE which worsened today. She describes this pain as sharp in nature. She has associated paresthesias to the left fingers. She states she has had this pain before, but it has been "a while."  She also complains of worsened lower back pain that is chronic for her. The lower back pain is described as a burning and radiates down the back of both her legs. She reports a history of a spine injury in the 1990's as well as a history of a "slipped disc." She denies recent falls or trauma to the affected areas. She states both areas of pain are worse with movement. She has been taking ibuprofen with only some relief. She denies changes in bowel or bladder function, numbness or weakness, fevers, or any other complaints.     Past Medical History:  Diagnosis Date  . Arthritis   . Asthma    meds as needed  . Chronic back pain   . Ectopic pregnancy    requiring surgery  . Elevated liver enzymes   . Gonorrhea   . Irritable bowel   . Migraine headache   . Preterm labor   . 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 Fibroid uterus 11/02/2015  . Abnormal uterine bleeding (AUB) 11/02/2015  . Asthma exacerbation 03/23/2015  . Tobacco abuse 03/23/2015  .  History of migraine 03/23/2015  . Acute asthma exacerbation   . Cough   . Acute bronchitis   . Asthma 03/22/2015  . Migraine, unspecified, without mention of intractable migraine without mention of status migrainosus 03/07/2014    Past Surgical History:  Procedure Laterality Date  . DILITATION & CURRETTAGE/HYSTROSCOPY WITH NOVASURE ABLATION N/A 02/20/2016   Procedure: HYSTEROSCOPY WITH NOVASURE ABLATION;  Surgeon: [name], MD;  Location: [facility];  Service: Gynecology;  Laterality: N/A;  . LAPAROSCOPY  05/30/2012   Procedure: LAPAROSCOPY OPERATIVE;  Surgeon: Osborne Oman, MD;  Location: [facility];  Service: Gynecology;  Laterality: N/A;  Operative laparoscopy right salpingectomy and removal of ectopic pregnancy  . LAPAROSCOPY FOR ECTOPIC PREGNANCY    . SALPINGECTOMY    . TUBAL LIGATION  2005  . WISDOM TOOTH EXTRACTION      OB History    Gravida Para Term Preterm AB Living   6 3 2 1 2 3    SAB TAB Ectopic Multiple Live Births   1   1           Home Medications    Prior to Admission medications   Medication Sig Start Date End Date Taking? Authorizing Provider  albuterol (PROVENTIL HFA;VENTOLIN HFA) 108 (90 BASE) MCG/ACT inhaler Inhale 1-2 puffs into the lungs every 6 (six) hours as needed for wheezing or shortness of  breath. Patient not taking: Reported on 09/26/2016 03/21/15   Delos Haring, PA-C  HYDROcodone-acetaminophen (NORCO/VICODIN) 5-325 MG tablet Take 1 tablet by mouth every 6 (six) hours as needed. 01/06/17   [name], PA-C  ibuprofen (ADVIL,MOTRIN) 600 MG tablet Take 1 tablet (600 mg total) by mouth every 6 (six) hours as needed. Patient not taking: Reported on 09/26/2016 01/14/16   Lacretia Leigh, MD  lidocaine (LIDODERM) 5 % Place 1 patch onto the skin daily. Remove & Discard patch within 12 hours or as directed by MD 01/06/17   [name], PA-C  methocarbamol (ROBAXIN) 500 MG tablet Take 1 tablet (500 mg total) by mouth 2 (two) times daily. 01/06/17   [name] C  Tranquilino Fischler, PA-C  metroNIDAZOLE (FLAGYL) 500 MG tablet Take 1 tablet (500 mg total) by mouth 2 (two) times daily. 10/01/16   [name], MD  predniSONE (STERAPRED UNI-PAK 21 TAB) 10 MG (21) TBPK tablet Take 6 tabs by mouth daily  for 2 days, then 5 tabs for 2 days, then 4 tabs for 2 days, then 3 tabs for 2 days, 2 tabs for 2 days, then 1 tab by mouth daily for 2 days 01/06/17   [name], PA-C    Family History Family History  Problem Relation Age of Onset  . Asthma Brother     Social History Social History  Substance Use Topics  . Smoking status: Former Smoker    Packs/day: 0.25    Years: 20.00    Types: Cigarettes    Quit date: 05/04/2016  . Smokeless tobacco: Never Used  . Alcohol use 0.6 oz/week    1 Glasses of wine per week     Comment: occasional     Allergies   Latex; Raspberry; and Vioxx [rofecoxib]   Review of Systems Review of Systems  Constitutional: Negative for chills, diaphoresis and fever.  Gastrointestinal: Negative for abdominal pain, nausea and vomiting.  Musculoskeletal: Positive for back pain and neck pain. Negative for neck stiffness.  Neurological: Negative for dizziness, syncope, weakness, light-headedness, numbness and headaches.       +paresthesia  All other systems reviewed and are negative.    Physical Exam Updated Vital Signs BP 122/90 (BP Location: Right Arm)   Pulse 72   Temp 97.9 F (36.6 C) (Oral)   Resp 18   SpO2 99%   Physical Exam  Constitutional: She appears well-developed and well-nourished. No distress.  HENT:  Head: Normocephalic and atraumatic.  Eyes: Conjunctivae and EOM are normal. Pupils are equal, round, and reactive to light.  Neck: Normal range of motion. Neck supple.  Cardiovascular: Normal rate, regular rhythm, normal heart sounds and intact distal pulses.   Pulmonary/Chest: Effort normal and breath sounds normal. No respiratory distress.  Abdominal: Soft. There is no tenderness. There is no guarding.    Musculoskeletal: She exhibits tenderness. She exhibits no edema.  Tenderness to bilateral lumbar and thoracic musculature. Tenderness through the bilateral sciatica distribution.  Tenderness to the left cervical musculature and the left trapezius. Normal motor function intact in all extremities and spine. No midline spinal tenderness.    Lymphadenopathy:    She has no cervical adenopathy.  Neurological: She is alert.  No sensory deficits. Strength 5/5 in all extremities. No gait disturbance. Coordination intact including heel to shin and finger to nose. Cranial nerves III-XII grossly intact. No facial droop.   Skin: Skin is warm and dry. She is not diaphoretic.  Psychiatric: She has a normal mood and affect. Her  behavior is normal.  Nursing note and vitals reviewed.   ED Treatments / Results  DIAGNOSTIC STUDIES: Oxygen Saturation is 99% on RA, normal by my interpretation.    COORDINATION OF CARE: 3:57 PM Discussed treatment plan with pt at bedside and pt agreed to plan.   Labs (all labs ordered are listed, but only abnormal results are displayed) Labs Reviewed - No data to display  EKG  EKG Interpretation None       Radiology No results found.  Procedures Procedures (including critical care time)  Medications Ordered in ED Medications  ketorolac (TORADOL) injection 60 mg (60 mg Intramuscular Given 01/06/17 1611)  HYDROcodone-acetaminophen (NORCO/VICODIN) 5-325 MG per tablet 2 tablet (2 tablets Oral Given 01/06/17 1611)  methocarbamol (ROBAXIN) tablet 1,000 mg (1,000 mg Oral Given 01/06/17 1611)     Initial Impression / Assessment and Plan / ED Course  I have reviewed the triage vital signs and the nursing notes.  Pertinent labs & imaging results that were available during my care of the patient were reviewed by me and considered in my medical decision making (see chart for details).  Clinical Course     Patient presents with symptoms of sciatica as well as cervical  radicular pain. She has no red flag symptoms. Patient improved significantly with conservative management here in the ED. Orthopedic follow-up. Further home care and return precautions discussed. Patient voiced understanding of all instructions and is comfortable with discharge.   Vitals:   01/06/17 1229 01/06/17 1626  BP: 122/90 133/88  Pulse: 72 66  Resp: 18 16  Temp: 97.9 F (36.6 C) 97.8 F (36.6 C)  TempSrc: Oral Oral  SpO2: 99% 100%     Final Clinical Impressions(s) / ED Diagnoses   Final diagnoses:  Chronic bilateral low back pain with bilateral sciatica    New Prescriptions Discharge Medication List as of 01/06/2017  4:08 PM    START taking these medications   Details  HYDROcodone-acetaminophen (NORCO/VICODIN) 5-325 MG tablet Take 1 tablet by mouth every 6 (six) hours as needed., Starting Mon 01/06/2017, Print    lidocaine (LIDODERM) 5 % Place 1 patch onto the skin daily. Remove & Discard patch within 12 hours or as directed by MD, Starting Mon 01/06/2017, Print    methocarbamol (ROBAXIN) 500 MG tablet Take 1 tablet (500 mg total) by mouth 2 (two) times daily., Starting Mon 01/06/2017, Print    predniSONE (STERAPRED UNI-PAK 21 TAB) 10 MG (21) TBPK tablet Take 6 tabs by mouth daily  for 2 days, then 5 tabs for 2 days, then 4 tabs for 2 days, then 3 tabs for 2 days, 2 tabs for 2 days, then 1 tab by mouth daily for 2 days, Print       I personally performed the services described in this documentation, which was scribed in my presence. The recorded information has been reviewed and is accurate.   [name], PA-C 01/06/17 [place], MD 01/07/17 [phone]

## 2017-01-06 NOTE — ED Notes (Signed)
Ambulated patient with no issues and reviewed DC papers with her and family

## 2017-01-06 NOTE — Discharge Instructions (Signed)
Take it easy, but do not lay around too much as this may make any stiffness worse. Take 500 mg of naproxen every 12 hours or 800 mg of ibuprofen every 8 hours for the next 3 days. Take these medications with food to avoid upset stomach. Vicodin for severe pain. Robaxin is a muscle relaxer and may help loosen stiff muscles. Do not take the Robaxin or the Vicodin while driving or performing other dangerous activities. Be sure to perform the attached exercises starting with three times a week and working up to performing them daily. This is an essential part of preventing long term problems. Follow up with a primary care provider for any future management of these complaints. You may choose to use either the prescribed lidocaine patches or those available over the counter, such as Salonpas. There is not much difference between them.

## 2017-01-06 NOTE — ED Notes (Signed)
Ambulated patient with no difficulty

## 2017-01-06 NOTE — ED Triage Notes (Signed)
Pt states woke up with lower back pain radiating into buttocks and thighs. Hx of slipped disc. States she felt like couldn't get out of bed this morning. Been going on for 2 weeks and not getting better. Been taking ibuprofen and not helping. Pain is numbness and burning and tingling bilaterally. Hurts with movement and bearing weight.

## 2017-02-06 ENCOUNTER — Ambulatory Visit: Payer: Medicaid Other | Admitting: Adult Health

## 2017-02-07 ENCOUNTER — Encounter: Payer: Self-pay | Admitting: Adult Health

## 2017-06-01 ENCOUNTER — Encounter (HOSPITAL_COMMUNITY): Payer: Self-pay | Admitting: *Deleted

## 2017-06-01 ENCOUNTER — Emergency Department (HOSPITAL_COMMUNITY)
Admission: EM | Admit: 2017-06-01 | Discharge: 2017-06-02 | Disposition: A | Discharge status: Home / Self Care | Payer: Medicaid Other | Attending: Emergency Medicine | Admitting: Emergency Medicine

## 2017-06-01 ENCOUNTER — Emergency Department (HOSPITAL_COMMUNITY): Payer: Medicaid Other

## 2017-06-01 DIAGNOSIS — Z87891 Personal history of nicotine dependence: Secondary | ICD-10-CM | POA: Diagnosis not present

## 2017-06-01 DIAGNOSIS — J0120 Acute ethmoidal sinusitis, unspecified: Secondary | ICD-10-CM | POA: Diagnosis not present

## 2017-06-01 DIAGNOSIS — Z91040 Latex allergy status: Secondary | ICD-10-CM | POA: Insufficient documentation

## 2017-06-01 DIAGNOSIS — Z79899 Other long term (current) drug therapy: Secondary | ICD-10-CM | POA: Diagnosis not present

## 2017-06-01 DIAGNOSIS — J45909 Unspecified asthma, uncomplicated: Secondary | ICD-10-CM | POA: Diagnosis not present

## 2017-06-01 DIAGNOSIS — H5713 Ocular pain, bilateral: Secondary | ICD-10-CM

## 2017-06-01 DIAGNOSIS — H5711 Ocular pain, right eye: Secondary | ICD-10-CM | POA: Diagnosis present

## 2017-06-01 LAB — CBC WITH DIFFERENTIAL/PLATELET
Basophils Absolute: 0 10*3/uL (ref 0.0–0.1)
Basophils Relative: 0 %
Eosinophils Absolute: 0.3 10*3/uL (ref 0.0–0.7)
Eosinophils Relative: 3 %
HCT: 38 % (ref 36.0–46.0)
Hemoglobin: 12.6 g/dL (ref 12.0–15.0)
Lymphocytes Relative: 52 %
Lymphs Abs: 4.7 10*3/uL — ABNORMAL HIGH (ref 0.7–4.0)
MCH: 29.9 pg (ref 26.0–34.0)
MCHC: 33.2 g/dL (ref 30.0–36.0)
MCV: 90 fL (ref 78.0–100.0)
Monocytes Absolute: 0.8 10*3/uL (ref 0.1–1.0)
Monocytes Relative: 9 %
Neutro Abs: 3.3 10*3/uL (ref 1.7–7.7)
Neutrophils Relative %: 36 %
Platelets: 247 10*3/uL (ref 150–400)
RBC: 4.22 MIL/uL (ref 3.87–5.11)
RDW: 12.8 % (ref 11.5–15.5)
WBC: 9.1 10*3/uL (ref 4.0–10.5)

## 2017-06-01 LAB — I-STAT CHEM 8, ED
BUN: 7 mg/dL (ref 6–20)
Calcium, Ion: 1.18 mmol/L (ref 1.15–1.40)
Chloride: 104 mmol/L (ref 101–111)
Creatinine, Ser: 0.8 mg/dL (ref 0.44–1.00)
Glucose, Bld: 95 mg/dL (ref 65–99)
HCT: 36 % (ref 36.0–46.0)
Hemoglobin: 12.2 g/dL (ref 12.0–15.0)
Potassium: 3.7 mmol/L (ref 3.5–5.1)
Sodium: 141 mmol/L (ref 135–145)
TCO2: 25 mmol/L (ref 0–100)

## 2017-06-01 LAB — SEDIMENTATION RATE: SED RATE: 18 mm/h (ref 0–22)

## 2017-06-01 MED ORDER — GADOBENATE DIMEGLUMINE 529 MG/ML IV SOLN
18.0000 mL | Freq: Once | INTRAVENOUS | Status: AC | PRN
  Administered 2017-06-01: 18 mL via INTRAVENOUS

## 2017-06-01 MED ORDER — LORAZEPAM 1 MG PO TABS
1.0000 mg | ORAL_TABLET | Freq: Once | ORAL | Status: AC
  Administered 2017-06-01: 1 mg via ORAL
  Filled 2017-06-01: qty 1

## 2017-06-01 MED ORDER — TETRACAINE HCL 0.5 % OP SOLN
1.0000 [drp] | Freq: Once | OPHTHALMIC | Status: AC
  Administered 2017-06-01: 1 [drp] via OPHTHALMIC
  Filled 2017-06-01: qty 2

## 2017-06-01 NOTE — ED Provider Notes (Signed)
Patient seen and examined. Discussed with Loni Muse NP.  Patient reports bilateral eye pain right greater than left. Her visual acuity is nearly symmetric and not poor. On exam her eyes appear normal without cloudy Chambers. She has 3 mm but reactive pupils. She reports pain with eye movement but does not have limitation to suggest acute ocular nerve palsy. Remainder of her cranial nerves are intact. No temporal artery pain or tenderness. No vision loss to suggest amaurosis. His never had past similar episodes. Recommend MRI of orbits and brain to rule out optic neuritis, sedimentation rate. Reevaluation.  If no etiology is found on MRI I think she could be safely followed up with ophthalmology outpatient.   Tanna Furry, MD 06/01/17 2041

## 2017-06-01 NOTE — ED Provider Notes (Signed)
[facility] DEPT Provider Note   CSN: [id number] Arrival date & time: 06/01/17  1823     History   Chief Complaint Chief Complaint  Patient presents with  . Eye Problem    HPI [name] is a 36 y.o. female.  3 days ago noticed bilateral eye pain R>L with blurry vision, headache and nausea. Husband states tha it looks swollen to him.States her BP at home has been elevated to 150/90. Denies any recent illness, sinus congestion, seasonal allergies, family history of glaucoma.       Past Medical History:  Diagnosis Date  . Arthritis   . Asthma    meds as needed  . Chronic back pain   . Ectopic pregnancy    requiring surgery  . Elevated liver enzymes   . Gonorrhea   . Irritable bowel   . Migraine headache   . Preterm labor   . 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 Fibroid uterus 11/02/2015  . Abnormal uterine bleeding (AUB) 11/02/2015  . Asthma exacerbation 03/23/2015  . Tobacco abuse 03/23/2015  . History of migraine 03/23/2015  . Acute asthma exacerbation   . Cough   . Acute bronchitis   . Asthma 03/22/2015  . Migraine, unspecified, without mention of intractable migraine without mention of status migrainosus 03/07/2014    Past Surgical History:  Procedure Laterality Date  . DILITATION & CURRETTAGE/HYSTROSCOPY WITH NOVASURE ABLATION N/A 02/20/2016   Procedure: HYSTEROSCOPY WITH NOVASURE ABLATION;  Surgeon: [name], MD;  Location: [facility];  Service: Gynecology;  Laterality: N/A;  . LAPAROSCOPY  05/30/2012   Procedure: LAPAROSCOPY OPERATIVE;  Surgeon: Osborne Oman, MD;  Location: [facility];  Service: Gynecology;  Laterality: N/A;  Operative laparoscopy right salpingectomy and removal of ectopic pregnancy  . LAPAROSCOPY FOR ECTOPIC PREGNANCY    . SALPINGECTOMY    . TUBAL LIGATION  2005  . WISDOM TOOTH EXTRACTION      OB History    Gravida Para Term Preterm AB  Living   6 3 2 1 2 3    SAB TAB Ectopic Multiple Live Births   1   1           Home Medications    Prior to Admission medications   Medication Sig Start Date End Date Taking? Authorizing Provider  albuterol (PROVENTIL HFA;VENTOLIN HFA) 108 (90 BASE) MCG/ACT inhaler Inhale 1-2 puffs into the lungs every 6 (six) hours as needed for wheezing or shortness of breath. 03/21/15  Yes Carlota Raspberry, Tiffany, PA-C  ibuprofen (ADVIL,MOTRIN) 600 MG tablet Take 1 tablet (600 mg total) by mouth every 6 (six) hours as needed. Patient taking differently: Take 400 mg by mouth every 6 (six) hours as needed.  01/14/16  Yes Lacretia Leigh, MD  amoxicillin-clavulanate (AUGMENTIN) 875-125 MG tablet Take 1 tablet by mouth every 12 (twelve) hours. 06/02/17   Junius Creamer, NP    Family History Family History  Problem Relation Age of Onset  . Asthma Brother     Social History Social History  Substance Use Topics  . Smoking status: Former Smoker    Packs/day: 0.25    Years: 20.00    Types: Cigarettes    Quit date: 05/04/2016  . Smokeless tobacco: Never Used  . Alcohol use 0.6 oz/week    1 Glasses of wine per week     Comment: occasional     Allergies   Latex; Raspberry;  and Vioxx [rofecoxib]   Review of Systems Review of Systems  Constitutional: Negative for fever.  HENT: Positive for facial swelling. Negative for congestion, rhinorrhea and sinus pain.   Eyes: Positive for pain and visual disturbance. Negative for photophobia, discharge, redness and itching.  Gastrointestinal: Positive for nausea. Negative for vomiting.  Neurological: Positive for headaches.  All other systems reviewed and are negative.    Physical Exam Updated Vital Signs BP 120/89 (BP Location: Left Arm)   Pulse 72   Temp 98.3 F (36.8 C)   Resp 18   Ht 5\' 4"  (1.626 m)   Wt 92.5 kg (204 lb)   SpO2 100%   BMI 35.02 kg/m   Physical Exam  Constitutional: She appears well-developed and well-nourished. No distress.  HENT:   Head: Normocephalic.  Right Ear: External ear normal.  Left Ear: External ear normal.  Mouth/Throat: Oropharynx is clear and moist.  Eyes: Conjunctivae and EOM are normal. Pupils are equal, round, and reactive to light. Right eye exhibits no nystagmus. Left eye exhibits no nystagmus.    Reports pain with eye movement of palpation    Cardiovascular: Normal rate.   Pulmonary/Chest: Effort normal.  Musculoskeletal: Normal range of motion.  Neurological: She is alert.  Skin: Skin is warm.  Psychiatric: She has a normal mood and affect.  Nursing note and vitals reviewed.    ED Treatments / Results  Labs (all labs ordered are listed, but only abnormal results are displayed) Labs Reviewed  CBC WITH DIFFERENTIAL/PLATELET - Abnormal; Notable for the following:       Result Value   Lymphs Abs 4.7 (*)    All other components within normal limits  SEDIMENTATION RATE  I-STAT CHEM 8, ED    EKG  EKG Interpretation None       Radiology Mr Jeri Cos And Wo Contrast  Result Date: 06/02/2017 CLINICAL DATA:  Initial evaluation for bilateral eye pain, right greater than left, with blurry vision. Template EXAM: MRI HEAD AND ORBITS WITHOUT AND WITH CONTRAST TECHNIQUE: Multiplanar, multiecho pulse sequences of the brain and surrounding structures were obtained without and with intravenous contrast. Multiplanar, multiecho pulse sequences of the orbits and surrounding structures were obtained including fat saturation techniques, before and after intravenous contrast administration. CONTRAST:  28mL MULTIHANCE GADOBENATE DIMEGLUMINE 529 MG/ML IV SOLN COMPARISON:  Prior study from 03/20/2014. FINDINGS: MRI HEAD FINDINGS Brain: Cerebral volume within normal limits for age. No focal parenchymal signal abnormality. No significant cerebral white matter disease. No abnormal foci of restricted diffusion to suggest acute or subacute ischemia. Gray-white matter differentiation well maintained. No evidence for  chronic infarction. No acute or chronic intracranial hemorrhage. No mass lesion, midline shift or mass effect. Ventricles normal size without evidence for hydrocephalus. No extra-axial fluid collection. The no abnormal enhancement. Vascular: Major intracranial vascular flow voids are maintained. Skull and upper cervical spine: Craniocervical junction within normal limits. Visualized upper cervical spine unremarkable. Bone marrow signal intensity normal. No scalp soft tissue abnormality. Other: No mastoid effusion.  Inner ear structures normal. MRI ORBITS FINDINGS Orbits: Thin section imaging through the globes and orbits was performed. Globes themselves are symmetric in size with normal morphology and contour. There is slightly increased asymmetric CSF fluid signal about the mid and distal aspect of the right optic nerve as compared to the left (series 10, image 10). No associated enhancement or significant inflammatory changes. No associated diffusion abnormality. This is of uncertain significance. No appreciable bulging of the optic disc at the posterior aspect  of the globe identified. Optic nerves themselves are symmetric in size with normal appearance without abnormal enhancement. No abnormality seen at the orbital apices. Optic chiasm normally situated within the suprasellar cistern. Intraconal and extraconal fat well-preserved. Extra-ocular muscles symmetric and normal. Superior orbital veins within normal limits bilaterally. Lacrimal glands normal. Visualized sinuses: Scattered mucosal thickening throughout the ethmoidal air cells and right maxillary sinus. Superimposed small fluid level present within the right max O sinus, suggesting acute sinusitis. Soft tissues: No appreciable periorbital soft tissue swelling. IMPRESSION: MRI HEAD IMPRESSION: Normal MRI of the brain.  No acute intracranial process identified. MRI ORBITS IMPRESSION: 1. Slight asymmetric CSF fluid signal about the mid-distal right optic  nerve sheath without associated enhancement or other significant inflammatory changes. Finding is of uncertain significance. 2. Otherwise unremarkable MRI of the globes and orbits. No abnormal enhancement or other acute inflammatory changes identified. 3. Right maxillary and ethmoidal sinus disease. Superimposed fluid level within the right maxillary sinus suggest acute sinusitis. Electronically Signed   By: Jeannine Boga M.D.   On: 06/02/2017 00:53   Mr Rosealee Albee [facility] Contrast  Result Date: 06/02/2017 CLINICAL DATA:  Initial evaluation for bilateral eye pain, right greater than left, with blurry vision. Template EXAM: MRI HEAD AND ORBITS WITHOUT AND WITH CONTRAST TECHNIQUE: Multiplanar, multiecho pulse sequences of the brain and surrounding structures were obtained without and with intravenous contrast. Multiplanar, multiecho pulse sequences of the orbits and surrounding structures were obtained including fat saturation techniques, before and after intravenous contrast administration. CONTRAST:  71mL MULTIHANCE GADOBENATE DIMEGLUMINE 529 MG/ML IV SOLN COMPARISON:  Prior study from 03/20/2014. FINDINGS: MRI HEAD FINDINGS Brain: Cerebral volume within normal limits for age. No focal parenchymal signal abnormality. No significant cerebral white matter disease. No abnormal foci of restricted diffusion to suggest acute or subacute ischemia. Gray-white matter differentiation well maintained. No evidence for chronic infarction. No acute or chronic intracranial hemorrhage. No mass lesion, midline shift or mass effect. Ventricles normal size without evidence for hydrocephalus. No extra-axial fluid collection. The no abnormal enhancement. Vascular: Major intracranial vascular flow voids are maintained. Skull and upper cervical spine: Craniocervical junction within normal limits. Visualized upper cervical spine unremarkable. Bone marrow signal intensity normal. No scalp soft tissue abnormality. Other: No mastoid  effusion.  Inner ear structures normal. MRI ORBITS FINDINGS Orbits: Thin section imaging through the globes and orbits was performed. Globes themselves are symmetric in size with normal morphology and contour. There is slightly increased asymmetric CSF fluid signal about the mid and distal aspect of the right optic nerve as compared to the left (series 10, image 10). No associated enhancement or significant inflammatory changes. No associated diffusion abnormality. This is of uncertain significance. No appreciable bulging of the optic disc at the posterior aspect of the globe identified. Optic nerves themselves are symmetric in size with normal appearance without abnormal enhancement. No abnormality seen at the orbital apices. Optic chiasm normally situated within the suprasellar cistern. Intraconal and extraconal fat well-preserved. Extra-ocular muscles symmetric and normal. Superior orbital veins within normal limits bilaterally. Lacrimal glands normal. Visualized sinuses: Scattered mucosal thickening throughout the ethmoidal air cells and right maxillary sinus. Superimposed small fluid level present within the right max O sinus, suggesting acute sinusitis. Soft tissues: No appreciable periorbital soft tissue swelling. IMPRESSION: MRI HEAD IMPRESSION: Normal MRI of the brain.  No acute intracranial process identified. MRI ORBITS IMPRESSION: 1. Slight asymmetric CSF fluid signal about the mid-distal right optic nerve sheath without associated enhancement or other significant inflammatory changes.  Finding is of uncertain significance. 2. Otherwise unremarkable MRI of the globes and orbits. No abnormal enhancement or other acute inflammatory changes identified. 3. Right maxillary and ethmoidal sinus disease. Superimposed fluid level within the right maxillary sinus suggest acute sinusitis. Electronically Signed   By: Jeannine Boga M.D.   On: 06/02/2017 00:53    Procedures Procedures (including critical  care time)  Medications Ordered in ED Medications  amoxicillin-clavulanate (AUGMENTIN) 875-125 MG per tablet 1 tablet (not administered)  tetracaine (PONTOCAINE) 0.5 % ophthalmic solution 1-2 drop (1 drop Both Eyes Given by Other 06/01/17 2031)  LORazepam (ATIVAN) tablet 1 mg (1 mg Oral Given 06/01/17 2212)  gadobenate dimeglumine (MULTIHANCE) injection 18 mL (18 mLs Intravenous Contrast Given 06/01/17 2353)     Initial Impression / Assessment and Plan / ED Course  I have reviewed the triage vital signs and the nursing notes.  Pertinent labs & imaging results that were available during my care of the patient were reviewed by me and considered in my medical decision making (see chart for details).   concern for MS, acute closed angle glaucoma, optic neuritis.   Visual acuity 20/25 L  20/30 R 25/25 bilaterally   Eye pressure R 13  L 9 MRI of the head and orbits reveal that she has acute ethmoid sinusitis.  She has been started on Augmentin for 10 days with ENT follow-up  Final Clinical Impressions(s) / ED Diagnoses   Final diagnoses:  Eye pain, bilateral  Acute ethmoidal sinusitis, recurrence not specified    New Prescriptions New Prescriptions   AMOXICILLIN-CLAVULANATE (AUGMENTIN) 875-125 MG TABLET    Take 1 tablet by mouth every 12 (twelve) hours.     Junius Creamer, NP 06/01/17 2049    Junius Creamer, NP 06/02/17 Blima Ledger    Tanna Furry, MD 06/08/17 1515

## 2017-06-01 NOTE — ED Triage Notes (Signed)
The pt is c/o rt  Eye pain for 3 days with blurred vision  No known injury  lmp  irregular

## 2017-06-02 MED ORDER — AMOXICILLIN-POT CLAVULANATE 875-125 MG PO TABS
1.0000 | ORAL_TABLET | Freq: Once | ORAL | Status: AC
  Administered 2017-06-02: 1 via ORAL
  Filled 2017-06-02: qty 1

## 2017-06-02 MED ORDER — AMOXICILLIN-POT CLAVULANATE 875-125 MG PO TABS: 1.0000 | ORAL_TABLET | Freq: Two times a day (BID) | ORAL | 0 refills | Status: DC

## 2017-06-02 NOTE — ED Notes (Signed)
Patient left at this time with all belongings. 

## 2017-06-02 NOTE — Discharge Instructions (Signed)
The MRI of your head and eyes reveal that you have acute sinusitis.  You've been placed on an antibiotic.  Please take this as directed for the full 10 days: you have been given a referral to ENT for follow-up and reevaluation.  I would make an appointment for 2-3 weeks from now

## 2017-06-02 NOTE — ED Notes (Signed)
ED Provider at bedside. 

## 2017-07-09 ENCOUNTER — Other Ambulatory Visit: Payer: Self-pay | Admitting: Internal Medicine

## 2017-07-09 DIAGNOSIS — R52 Pain, unspecified: Secondary | ICD-10-CM

## 2017-07-24 ENCOUNTER — Other Ambulatory Visit: Payer: Medicaid Other

## 2017-07-24 ENCOUNTER — Ambulatory Visit
Admission: RE | Admit: 2017-07-24 | Discharge: 2017-07-24 | Disposition: A | Discharge status: Home / Self Care | Payer: Medicaid Other | Source: Ambulatory Visit | Attending: Internal Medicine | Admitting: Internal Medicine

## 2017-07-24 DIAGNOSIS — R52 Pain, unspecified: Secondary | ICD-10-CM

## 2017-09-14 ENCOUNTER — Inpatient Hospital Stay (HOSPITAL_COMMUNITY): Payer: Medicaid Other

## 2017-09-14 ENCOUNTER — Inpatient Hospital Stay (HOSPITAL_COMMUNITY)
Admission: AD | Admit: 2017-09-14 | Discharge: 2017-09-15 | Disposition: A | Discharge status: Home / Self Care | Payer: Medicaid Other | Source: Ambulatory Visit | Attending: Obstetrics and Gynecology | Admitting: Obstetrics and Gynecology

## 2017-09-14 DIAGNOSIS — D252 Subserosal leiomyoma of uterus: Secondary | ICD-10-CM | POA: Diagnosis not present

## 2017-09-14 DIAGNOSIS — Z91040 Latex allergy status: Secondary | ICD-10-CM | POA: Diagnosis not present

## 2017-09-14 DIAGNOSIS — R1032 Left lower quadrant pain: Secondary | ICD-10-CM

## 2017-09-14 DIAGNOSIS — Z87891 Personal history of nicotine dependence: Secondary | ICD-10-CM | POA: Insufficient documentation

## 2017-09-14 LAB — URINALYSIS, ROUTINE W REFLEX MICROSCOPIC
Bilirubin Urine: NEGATIVE
GLUCOSE, UA: NEGATIVE mg/dL
HGB URINE DIPSTICK: NEGATIVE
Ketones, ur: NEGATIVE mg/dL
Leukocytes, UA: NEGATIVE
Nitrite: NEGATIVE
PH: 6 (ref 5.0–8.0)
PROTEIN: NEGATIVE mg/dL
Specific Gravity, Urine: 1.02 (ref 1.005–1.030)

## 2017-09-14 LAB — WET PREP, GENITAL
CLUE CELLS WET PREP: NONE SEEN
SPERM: NONE SEEN
Trich, Wet Prep: NONE SEEN
Yeast Wet Prep HPF POC: NONE SEEN

## 2017-09-14 LAB — CBC
HCT: 38.2 % (ref 36.0–46.0)
Hemoglobin: 13.1 g/dL (ref 12.0–15.0)
MCH: 30.5 pg (ref 26.0–34.0)
MCHC: 34.3 g/dL (ref 30.0–36.0)
MCV: 89 fL (ref 78.0–100.0)
PLATELETS: 232 10*3/uL (ref 150–400)
RBC: 4.29 MIL/uL (ref 3.87–5.11)
RDW: 12.8 % (ref 11.5–15.5)
WBC: 9.9 10*3/uL (ref 4.0–10.5)

## 2017-09-14 LAB — POCT PREGNANCY, URINE: Preg Test, Ur: NEGATIVE

## 2017-09-14 LAB — HCG, QUANTITATIVE, PREGNANCY

## 2017-09-14 MED ORDER — KETOROLAC TROMETHAMINE 60 MG/2ML IM SOLN
60.0000 mg | Freq: Once | INTRAMUSCULAR | Status: AC
  Administered 2017-09-14: 60 mg via INTRAMUSCULAR
  Filled 2017-09-14: qty 2

## 2017-09-14 NOTE — MAU Provider Note (Signed)
History     CSN: [id number]  Arrival date and time: 09/14/17 1925   First Provider Initiated Contact with Patient 09/14/17 1947      Chief Complaint  Patient presents with  . Pelvic Pain  . Abdominal Pain   HPI [name] 36 y.o. Comes to MAU with severe LLQ pain such that she thought she was having an ectopic pregnancy.  Rocking in bed.  Urine pregnancy test is negative.  Has had this pain for a couple of days, but it is worse tonight.  Has not taken anything for pain.  Last intercourse was 3 days ago.  Hx of previous ablation and does not have periods.    OB History    Gravida Para Term Preterm AB Living   6 3 2 1 2 3    SAB TAB Ectopic Multiple Live Births   1   1          Past Medical History:  Diagnosis Date  . Arthritis   . Asthma    meds as needed  . Chronic back pain   . Ectopic pregnancy    requiring surgery  . Elevated liver enzymes   . Gonorrhea   . Irritable bowel   . Migraine headache   . Preterm labor   . 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 DILITATION & CURRETTAGE/HYSTROSCOPY WITH NOVASURE ABLATION N/A 02/20/2016   Procedure: HYSTEROSCOPY WITH NOVASURE ABLATION;  Surgeon: [name], MD;  Location: [facility];  Service: Gynecology;  Laterality: N/A;  . LAPAROSCOPY  05/30/2012   Procedure: LAPAROSCOPY OPERATIVE;  Surgeon: Osborne Oman, MD;  Location: [facility];  Service: Gynecology;  Laterality: N/A;  Operative laparoscopy right salpingectomy and removal of ectopic pregnancy  . LAPAROSCOPY FOR ECTOPIC PREGNANCY    . SALPINGECTOMY    . TUBAL LIGATION  2005  . WISDOM TOOTH EXTRACTION      Family History  Problem Relation Age of Onset  . Asthma Brother   . Breast cancer Cousin     Social History  Substance Use Topics  . Smoking status: Former Smoker    Packs/day: 0.25    Years: 20.00    Types: Cigarettes    Quit date: 05/04/2016  . Smokeless tobacco:  Never Used  . Alcohol use 0.6 oz/week    1 Glasses of wine per week     Comment: occasional    Allergies:  Allergies  Allergen Reactions  . Latex Hives  . Raspberry Hives, Itching and Swelling  . Vioxx [Rofecoxib] Hives    Prescriptions Prior to Admission  Medication Sig Dispense Refill Last Dose  . albuterol (PROVENTIL HFA;VENTOLIN HFA) 108 (90 BASE) MCG/ACT inhaler Inhale 1-2 puffs into the lungs every 6 (six) hours as needed for wheezing or shortness of breath. 1 Inhaler 0 unk  . amoxicillin-clavulanate (AUGMENTIN) 875-125 MG tablet Take 1 tablet by mouth every 12 (twelve) hours. 20 tablet 0   . ibuprofen (ADVIL,MOTRIN) 600 MG tablet Take 1 tablet (600 mg total) by mouth every 6 (six) hours as needed. (Patient taking differently: Take 400 mg by mouth every 6 (six) hours as needed. ) 30 tablet 0 unk    Review of Systems  Constitutional: Negative for fever.  Gastrointestinal: Negative for diarrhea, nausea and vomiting.  Genitourinary: Positive for pelvic pain and vaginal discharge. Negative for vaginal bleeding.   Physical Exam   Blood pressure (!) 157/94, pulse 85, temperature  98.2 F (36.8 C), temperature source Oral, resp. rate 18, height 5\' 4"  (1.626 m), weight 200 lb (90.7 kg), SpO2 100 %.  Physical Exam  Nursing note and vitals reviewed. Constitutional: She is oriented to person, place, and time. She appears well-developed and well-nourished.  HENT:  Head: Normocephalic.  Eyes: EOM are normal.  Neck: Neck supple.  GI: Soft. There is tenderness. There is no rebound and no guarding.  Worse in low midline.  When checking in LUQ, radiates to LLQ.  Minimal pain in RUQ and RLQ  Genitourinary:  Genitourinary Comments: Speculum exam: Vagina - Small amount of white discharge, no odor Cervix - No contact bleeding Bimanual exam: Cervix closed but has mild tenderness when palpated Uterus very tender, unable to size due to pain Adnexa tender on left but most of the tenderness  is the uterus - client tearful with bimanual exam due to pain. GC/Chlam, wet prep done Chaperone present for exam.   Musculoskeletal: Normal range of motion.  Neurological: She is alert and oriented to person, place, and time.  Skin: Skin is warm and dry.  Psychiatric: She has a normal mood and affect.    MAU Course  Procedures  MDM Toradol 60 mg IM for pain.  Care assumed by L Amedeo Gory, CNM at 2130  Assessment and Plan    Virginia Rochester 09/14/2017, 7:56 PM   MDM Toradol dose improved pt pain but did not resolve it. [place] with no evidence of torsion or other emergency but fibroid slightly larger in size.  Will prescribe ibuprofen 600 mg Q 6 hours PRN and Tramadol 50-100 mg Q 6 hours PRN x 10 tabs. Pt to follow up with OB/Gyn, return to MAU as needed for emergencies. Stable at time of discharge.  A: 1. Subserous leiomyoma of uterus   2. Left lower quadrant pain    P: D/C home  Allergies as of 09/15/2017      Reactions   Latex Hives   Raspberry Hives, Itching, Swelling   Vioxx [rofecoxib] Hives      Medication List    STOP taking these medications   amoxicillin-clavulanate 875-125 MG tablet Commonly known as:  AUGMENTIN     TAKE these medications   albuterol 108 (90 Base) MCG/ACT inhaler Commonly known as:  PROVENTIL HFA;VENTOLIN HFA Inhale 1-2 puffs into the lungs every 6 (six) hours as needed for wheezing or shortness of breath.   ibuprofen 600 MG tablet Commonly known as:  ADVIL,MOTRIN Take 1 tablet (600 mg total) by mouth every 6 (six) hours as needed. What changed:  how much to take   traMADol 50 MG tablet Commonly known as:  ULTRAM Take 1-2 tablets (50-100 mg total) by mouth every 6 (six) hours as needed.            Discharge Care Instructions        Start     Ordered   09/15/17 0000  ibuprofen (ADVIL,MOTRIN) 600 MG tablet  Every 6 hours PRN    Question:  Supervising Provider  Answer:  Jonnie Kind   09/15/17 0024   09/15/17 0000   Discharge patient    Question Answer Comment  Discharge disposition 01-Home or Self Care   Discharge patient date 09/15/2017      09/15/17 0024   09/15/17 0000  traMADol (ULTRAM) 50 MG tablet  Every 6 hours PRN    Question:  Supervising Provider  Answer:  Jonnie Kind   09/15/17 0027  Fatima Blank, CNM 12:29 AM

## 2017-09-14 NOTE — Discharge Instructions (Signed)
In late 2019, the [facility] will be moving to the [place] campus. At that time, the MAU will no longer serve non-pregnant patients. We encourage you to establish care with a provider before that time, so that you can be seen with any GYN concerns, like vaginal discharge, urinary tract infection, etc.. in a timely manner. In order to make the office visit more convenient, the [facility] will be offering evening hours from 4pm-8pm on Mondays starting 09/01/17. There will be same-day appointments, walk-in appointments and scheduled appointments available during this time.   ° °[facility] - [phone] ° °For urgent needs, [facility] is also available for management of urgent GYN complaints such as vaginal discharge.  ° °[employer] extends eligibility for family planning services to reduce unintended pregnancies and improve the well-being of children and families. ° ° Eligible individuals whose income is at or below 195% of the federal poverty level and who are:  °- [place] citizens, documented immigrants or qualified aliens;  °- Residents of [place];  °- Not incarcerated; and  °- Not pregnant.  ° °[employer] Contact Information:  °Medical Assistance Clinical Section °Phone: [phone] °Email: dma.besmart@[facility].gov  ° ° ° °

## 2017-09-15 DIAGNOSIS — D252 Subserosal leiomyoma of uterus: Secondary | ICD-10-CM

## 2017-09-15 DIAGNOSIS — R1032 Left lower quadrant pain: Secondary | ICD-10-CM

## 2017-09-15 LAB — HIV ANTIBODY (ROUTINE TESTING W REFLEX): HIV Screen 4th Generation wRfx: NONREACTIVE

## 2017-09-15 LAB — GC/CHLAMYDIA PROBE AMP (~~LOC~~) NOT AT ARMC
CHLAMYDIA, DNA PROBE: NEGATIVE
NEISSERIA GONORRHEA: NEGATIVE

## 2017-09-15 LAB — RPR: RPR: NONREACTIVE

## 2017-09-15 MED ORDER — IBUPROFEN 600 MG PO TABS: 600.0000 mg | ORAL_TABLET | Freq: Four times a day (QID) | ORAL | 1 refills | Status: DC | PRN

## 2017-09-15 MED ORDER — TRAMADOL HCL 50 MG PO TABS: 50.0000 mg | ORAL_TABLET | Freq: Four times a day (QID) | ORAL | 0 refills | Status: DC | PRN

## 2017-09-24 ENCOUNTER — Encounter: Payer: Self-pay | Admitting: Obstetrics & Gynecology

## 2017-09-24 ENCOUNTER — Other Ambulatory Visit (HOSPITAL_COMMUNITY)
Admission: RE | Admit: 2017-09-24 | Discharge: 2017-09-24 | Disposition: A | Discharge status: Home / Self Care | Payer: Medicaid Other | Source: Ambulatory Visit | Attending: Obstetrics & Gynecology | Admitting: Obstetrics & Gynecology

## 2017-09-24 ENCOUNTER — Ambulatory Visit (INDEPENDENT_AMBULATORY_CARE_PROVIDER_SITE_OTHER): Payer: Medicaid Other | Admitting: Obstetrics & Gynecology

## 2017-09-24 VITALS — BP 145/81 | HR 81 | Wt 201.6 lb

## 2017-09-24 DIAGNOSIS — D259 Leiomyoma of uterus, unspecified: Secondary | ICD-10-CM

## 2017-09-24 DIAGNOSIS — Z0000 Encounter for general adult medical examination without abnormal findings: Secondary | ICD-10-CM | POA: Insufficient documentation

## 2017-09-24 DIAGNOSIS — Z124 Encounter for screening for malignant neoplasm of cervix: Secondary | ICD-10-CM | POA: Diagnosis not present

## 2017-09-24 DIAGNOSIS — R102 Pelvic and perineal pain: Secondary | ICD-10-CM

## 2017-09-24 DIAGNOSIS — Z113 Encounter for screening for infections with a predominantly sexual mode of transmission: Secondary | ICD-10-CM

## 2017-09-24 NOTE — Progress Notes (Signed)
   Subjective:    Patient ID: [name], female    DOB: [birth year], 36 y.o.   MRN: [id number]  HPI 36yo M AA P3 here with the complaint of increasing pelvic pain, pressure, urge to void/defecate. She has also been having dyspareunia. She hasn't had sex at least a month due to the pain. She reports almost daily vaginal bleeding. She has tried ablation, depo provera, OCPs, IUD, all with no relief.   Review of Systems She has had a BTL.   She drives dump trucks. Married for 11 years She got her flu vaccine recently.  Objective:   Physical Exam   Fairly good pubic arch for vaginal surgery Moderately mobile uterus about 8 week size     Assessment & Plan:  Symptomatic fibroids- she is ready for a hysterectomy. We discussed the various approaches. We agree on the LAVH/BS. However, she is aware that a TAH is still a possibility if I cannot get it out through the vaginal approach. I sent Drema Balzarine an email to schedule this. Pap smear done today

## 2017-09-25 ENCOUNTER — Encounter (HOSPITAL_COMMUNITY): Payer: Self-pay

## 2017-09-26 LAB — CYTOLOGY - PAP
BACTERIAL VAGINITIS: NEGATIVE
CANDIDA VAGINITIS: NEGATIVE
Chlamydia: NEGATIVE
DIAGNOSIS: NEGATIVE
HPV: NOT DETECTED
NEISSERIA GONORRHEA: NEGATIVE
TRICH (WINDOWPATH): NEGATIVE

## 2017-09-28 ENCOUNTER — Telehealth (HOSPITAL_COMMUNITY): Payer: Self-pay | Admitting: Advanced Practice Midwife

## 2017-09-28 DIAGNOSIS — N938 Other specified abnormal uterine and vaginal bleeding: Secondary | ICD-10-CM

## 2017-09-28 DIAGNOSIS — D219 Benign neoplasm of connective and other soft tissue, unspecified: Secondary | ICD-10-CM

## 2017-09-30 MED ORDER — TRAMADOL HCL 50 MG PO TABS: 50.0000 mg | ORAL_TABLET | Freq: Four times a day (QID) | ORAL | 0 refills | Status: DC | PRN

## 2017-09-30 NOTE — Telephone Encounter (Signed)
Called Dr Hulan Fray and received order for tramadol 50mg  disp 40. Called patient and informed her of Rx and to make prescription last until surgery. Provided surgery date to patient. Patient verbalized understanding & asked if they had anything sooner. Told patient likely not but when she receives her letter in the mail it should have a contact phone number on there for the surgery scheduler. Patient verbalized understanding & had no questions

## 2017-09-30 NOTE — Telephone Encounter (Signed)
Patient called and left message stating she is calling for a prescription for pain medication. Patient states she has the ibuprofen but it isn't touching the pain. Called patient and she states she has been taking ibuprofen but it isn't touching her pain. Patient states she has fibroids and feels constant pain/contractions. Told patient I would call Dr Hulan Fray this afternoon to see if there is something else we can prescribe & I will call her back. Patient verbalized understanding & had no questions

## 2017-10-01 ENCOUNTER — Ambulatory Visit: Payer: Medicaid Other | Admitting: Obstetrics & Gynecology

## 2017-10-08 ENCOUNTER — Ambulatory Visit: Payer: Medicaid Other | Admitting: Obstetrics & Gynecology

## 2017-11-04 NOTE — Patient Instructions (Addendum)
Your procedure is scheduled on: Tuesday November 27 at 9:00 am  Enter through the [employer] of [facility] at: 7:30 am  Pick up the phone at the desk and dial [phone].  Call this number if you have problems the morning of surgery: [phone].  Remember: Do NOT eat food or drink any liquids after: Midnight on Monday November 26   Take these medicines the morning of surgery with a SIP OF WATER: Diclofenac, Tizanidine, Tramadol if needed  BRING ALBUTEROL INHALER WITH YOU DAY OF SURGERY  STOP ALL VITAMINS AND SUPPLEMENTS 1 WEEK PRIOR TO SURGERY  DO NOT SMOKE DAY OF SURGERY  Do NOT wear jewelry (body piercing), metal hair clips/bobby pins, make-up, artifical eye lashes or nail polish. Do NOT wear lotions, powders, or perfumes.  You may wear deoderant. Do NOT shave for 48 hours prior to surgery. Do NOT bring valuables to the hospital Contacts, dentures, or bridgework may not be worn into surgery.  Leave suitcase in car.  After surgery it may be brought to your room.  For patients admitted to the hospital, checkout time is 11:00 AM the day of discharge.

## 2017-11-05 ENCOUNTER — Encounter (HOSPITAL_COMMUNITY): Payer: Self-pay

## 2017-11-05 ENCOUNTER — Encounter (HOSPITAL_COMMUNITY)
Admission: RE | Admit: 2017-11-05 | Discharge: 2017-11-05 | Disposition: A | Discharge status: Home / Self Care | Payer: Medicaid Other | Source: Ambulatory Visit | Attending: Obstetrics & Gynecology | Admitting: Obstetrics & Gynecology

## 2017-11-05 ENCOUNTER — Other Ambulatory Visit: Payer: Self-pay

## 2017-11-05 DIAGNOSIS — Z01812 Encounter for preprocedural laboratory examination: Secondary | ICD-10-CM | POA: Insufficient documentation

## 2017-11-05 DIAGNOSIS — D259 Leiomyoma of uterus, unspecified: Secondary | ICD-10-CM | POA: Diagnosis not present

## 2017-11-05 HISTORY — DX: Peripheral vascular disease, unspecified: I73.9

## 2017-11-05 HISTORY — DX: Gastro-esophageal reflux disease without esophagitis: K21.9

## 2017-11-05 HISTORY — DX: Anxiety disorder, unspecified: F41.9

## 2017-11-05 HISTORY — DX: Essential (primary) hypertension: I10

## 2017-11-05 HISTORY — DX: Bronchitis, not specified as acute or chronic: J40

## 2017-11-05 HISTORY — DX: Pneumonia, unspecified organism: J18.9

## 2017-11-05 LAB — BASIC METABOLIC PANEL
ANION GAP: 8 (ref 5–15)
BUN: 10 mg/dL (ref 6–20)
CO2: 23 mmol/L (ref 22–32)
Calcium: 8.7 mg/dL — ABNORMAL LOW (ref 8.9–10.3)
Chloride: 100 mmol/L — ABNORMAL LOW (ref 101–111)
Creatinine, Ser: 0.59 mg/dL (ref 0.44–1.00)
GFR calc non Af Amer: >60 mL/min (ref >60)
GLUCOSE: 99 mg/dL (ref 65–99)
POTASSIUM: 3.2 mmol/L — AB (ref 3.5–5.1)
Sodium: 131 mmol/L — ABNORMAL LOW (ref 135–145)

## 2017-11-05 LAB — CBC
HEMATOCRIT: 38.2 % (ref 36.0–46.0)
Hemoglobin: 13.3 g/dL (ref 12.0–15.0)
MCH: 30.8 pg (ref 26.0–34.0)
MCHC: 34.8 g/dL (ref 30.0–36.0)
MCV: 88.4 fL (ref 78.0–100.0)
PLATELETS: 229 10*3/uL (ref 150–400)
RBC: 4.32 MIL/uL (ref 3.87–5.11)
RDW: 12.9 % (ref 11.5–15.5)
WBC: 6.5 10*3/uL (ref 4.0–10.5)

## 2017-11-05 LAB — TYPE AND SCREEN
ABO/RH(D): B POS
Antibody Screen: NEGATIVE

## 2017-11-18 ENCOUNTER — Other Ambulatory Visit: Payer: Self-pay

## 2017-11-18 ENCOUNTER — Encounter (HOSPITAL_COMMUNITY): Payer: Self-pay

## 2017-11-18 ENCOUNTER — Observation Stay (HOSPITAL_COMMUNITY)
Admission: AD | Admit: 2017-11-18 | Discharge: 2017-11-19 | Disposition: A | Discharge status: Home / Self Care | Payer: Medicaid Other | Source: Ambulatory Visit | Attending: Obstetrics & Gynecology | Admitting: Obstetrics & Gynecology

## 2017-11-18 ENCOUNTER — Encounter (HOSPITAL_COMMUNITY)
Admission: AD | Disposition: A | Discharge status: Home / Self Care | Payer: Self-pay | Source: Ambulatory Visit | Attending: Obstetrics & Gynecology

## 2017-11-18 ENCOUNTER — Encounter (HOSPITAL_COMMUNITY): Payer: Self-pay | Admitting: Emergency Medicine

## 2017-11-18 ENCOUNTER — Ambulatory Visit (HOSPITAL_COMMUNITY): Payer: Medicaid Other | Admitting: Anesthesiology

## 2017-11-18 DIAGNOSIS — I739 Peripheral vascular disease, unspecified: Secondary | ICD-10-CM | POA: Diagnosis not present

## 2017-11-18 DIAGNOSIS — R102 Pelvic and perineal pain: Secondary | ICD-10-CM

## 2017-11-18 DIAGNOSIS — Z87891 Personal history of nicotine dependence: Secondary | ICD-10-CM | POA: Diagnosis not present

## 2017-11-18 DIAGNOSIS — K219 Gastro-esophageal reflux disease without esophagitis: Secondary | ICD-10-CM | POA: Diagnosis not present

## 2017-11-18 DIAGNOSIS — F419 Anxiety disorder, unspecified: Secondary | ICD-10-CM | POA: Insufficient documentation

## 2017-11-18 DIAGNOSIS — N880 Leukoplakia of cervix uteri: Secondary | ICD-10-CM | POA: Diagnosis not present

## 2017-11-18 DIAGNOSIS — N888 Other specified noninflammatory disorders of cervix uteri: Secondary | ICD-10-CM | POA: Insufficient documentation

## 2017-11-18 DIAGNOSIS — D252 Subserosal leiomyoma of uterus: Secondary | ICD-10-CM | POA: Insufficient documentation

## 2017-11-18 DIAGNOSIS — D259 Leiomyoma of uterus, unspecified: Secondary | ICD-10-CM | POA: Diagnosis present

## 2017-11-18 DIAGNOSIS — N938 Other specified abnormal uterine and vaginal bleeding: Secondary | ICD-10-CM | POA: Diagnosis not present

## 2017-11-18 DIAGNOSIS — J45909 Unspecified asthma, uncomplicated: Secondary | ICD-10-CM | POA: Insufficient documentation

## 2017-11-18 DIAGNOSIS — Z79899 Other long term (current) drug therapy: Secondary | ICD-10-CM | POA: Diagnosis not present

## 2017-11-18 DIAGNOSIS — I10 Essential (primary) hypertension: Secondary | ICD-10-CM | POA: Insufficient documentation

## 2017-11-18 DIAGNOSIS — K589 Irritable bowel syndrome without diarrhea: Secondary | ICD-10-CM | POA: Insufficient documentation

## 2017-11-18 DIAGNOSIS — Z9889 Other specified postprocedural states: Secondary | ICD-10-CM

## 2017-11-18 DIAGNOSIS — Z23 Encounter for immunization: Secondary | ICD-10-CM | POA: Insufficient documentation

## 2017-11-18 DIAGNOSIS — N800 Endometriosis of uterus: Secondary | ICD-10-CM | POA: Insufficient documentation

## 2017-11-18 HISTORY — PX: LAPAROSCOPIC VAGINAL HYSTERECTOMY WITH SALPINGECTOMY: SHX6680

## 2017-11-18 LAB — PREGNANCY, URINE: Preg Test, Ur: NEGATIVE

## 2017-11-18 SURGERY — DILATION AND EVACUATION, UTERUS
Anesthesia: Choice

## 2017-11-18 SURGERY — HYSTERECTOMY, VAGINAL, LAPAROSCOPY-ASSISTED, WITH SALPINGECTOMY
Anesthesia: General | Laterality: Left

## 2017-11-18 MED ORDER — DEXAMETHASONE SODIUM PHOSPHATE 10 MG/ML IJ SOLN
INTRAMUSCULAR | Status: AC
  Filled 2017-11-18: qty 1

## 2017-11-18 MED ORDER — PNEUMOCOCCAL VAC POLYVALENT 25 MCG/0.5ML IJ INJ
0.5000 mL | INJECTION | INTRAMUSCULAR | Status: AC
  Administered 2017-11-19: 0.5 mL via INTRAMUSCULAR
  Filled 2017-11-18 (×2): qty 0.5

## 2017-11-18 MED ORDER — PROMETHAZINE HCL 25 MG/ML IJ SOLN: 6.2500 mg | INTRAMUSCULAR | Status: DC | PRN

## 2017-11-18 MED ORDER — BUPIVACAINE HCL (PF) 0.5 % IJ SOLN
INTRAMUSCULAR | Status: AC
  Filled 2017-11-18: qty 30

## 2017-11-18 MED ORDER — TIZANIDINE HCL 4 MG PO TABS
4.0000 mg | ORAL_TABLET | Freq: Two times a day (BID) | ORAL | Status: DC | PRN
  Filled 2017-11-18: qty 1

## 2017-11-18 MED ORDER — CITALOPRAM HYDROBROMIDE 10 MG PO TABS
10.0000 mg | ORAL_TABLET | Freq: Every evening | ORAL | Status: DC
  Administered 2017-11-18: 10 mg via ORAL
  Filled 2017-11-18 (×2): qty 1

## 2017-11-18 MED ORDER — PROPOFOL 10 MG/ML IV BOLUS
INTRAVENOUS | Status: AC
  Filled 2017-11-18: qty 20

## 2017-11-18 MED ORDER — KETOROLAC TROMETHAMINE 30 MG/ML IJ SOLN
INTRAMUSCULAR | Status: AC
  Filled 2017-11-18: qty 1

## 2017-11-18 MED ORDER — HYDROMORPHONE HCL 1 MG/ML IJ SOLN
0.2000 mg | INTRAMUSCULAR | Status: DC | PRN
  Administered 2017-11-18 (×3): 0.6 mg via INTRAVENOUS
  Filled 2017-11-18 (×3): qty 1

## 2017-11-18 MED ORDER — HYDROCHLOROTHIAZIDE 25 MG PO TABS
12.5000 mg | ORAL_TABLET | Freq: Every day | ORAL | Status: DC
  Filled 2017-11-18 (×2): qty 0.5

## 2017-11-18 MED ORDER — BUPIVACAINE-EPINEPHRINE 0.5% -1:200000 IJ SOLN
INTRAMUSCULAR | Status: DC | PRN
  Administered 2017-11-18: 30 mL

## 2017-11-18 MED ORDER — HYDROMORPHONE HCL 1 MG/ML IJ SOLN
INTRAMUSCULAR | Status: AC
  Filled 2017-11-18: qty 0.5

## 2017-11-18 MED ORDER — DEXAMETHASONE SODIUM PHOSPHATE 10 MG/ML IJ SOLN
INTRAMUSCULAR | Status: DC | PRN
  Administered 2017-11-18: 8 mg via INTRAVENOUS

## 2017-11-18 MED ORDER — SUGAMMADEX SODIUM 200 MG/2ML IV SOLN
INTRAVENOUS | Status: AC
  Filled 2017-11-18: qty 2

## 2017-11-18 MED ORDER — OXYCODONE-ACETAMINOPHEN 5-325 MG PO TABS
1.0000 | ORAL_TABLET | ORAL | Status: DC | PRN
  Administered 2017-11-18 – 2017-11-19 (×2): 2 via ORAL
  Filled 2017-11-18 (×2): qty 2

## 2017-11-18 MED ORDER — EPHEDRINE SULFATE 50 MG/ML IJ SOLN
INTRAMUSCULAR | Status: DC | PRN
  Administered 2017-11-18 (×2): 5 mg via INTRAVENOUS

## 2017-11-18 MED ORDER — BUPIVACAINE-EPINEPHRINE (PF) 0.5% -1:200000 IJ SOLN
INTRAMUSCULAR | Status: AC
  Filled 2017-11-18: qty 30

## 2017-11-18 MED ORDER — MIDAZOLAM HCL 2 MG/2ML IJ SOLN
INTRAMUSCULAR | Status: AC
  Filled 2017-11-18: qty 2

## 2017-11-18 MED ORDER — HYDROMORPHONE HCL 1 MG/ML IJ SOLN
INTRAMUSCULAR | Status: AC
  Filled 2017-11-18: qty 1

## 2017-11-18 MED ORDER — ONDANSETRON HCL 4 MG/2ML IJ SOLN
INTRAMUSCULAR | Status: AC
  Filled 2017-11-18: qty 2

## 2017-11-18 MED ORDER — ONDANSETRON HCL 4 MG/2ML IJ SOLN: 4.0000 mg | Freq: Four times a day (QID) | INTRAMUSCULAR | Status: DC | PRN

## 2017-11-18 MED ORDER — CEFAZOLIN SODIUM-DEXTROSE 2-3 GM-%(50ML) IV SOLR
INTRAVENOUS | Status: AC
  Filled 2017-11-18: qty 50

## 2017-11-18 MED ORDER — LACTATED RINGERS IV SOLN
INTRAVENOUS | Status: DC
  Administered 2017-11-18 (×3): via INTRAVENOUS

## 2017-11-18 MED ORDER — SUGAMMADEX SODIUM 200 MG/2ML IV SOLN
INTRAVENOUS | Status: DC | PRN
  Administered 2017-11-18: 200 mg via INTRAVENOUS

## 2017-11-18 MED ORDER — PROPOFOL 10 MG/ML IV BOLUS
INTRAVENOUS | Status: DC | PRN
  Administered 2017-11-18: 200 mg via INTRAVENOUS

## 2017-11-18 MED ORDER — FENTANYL CITRATE (PF) 250 MCG/5ML IJ SOLN
INTRAMUSCULAR | Status: AC
  Filled 2017-11-18: qty 5

## 2017-11-18 MED ORDER — HYDROCHLOROTHIAZIDE 12.5 MG PO CAPS
12.5000 mg | ORAL_CAPSULE | Freq: Every day | ORAL | Status: DC
  Administered 2017-11-18 – 2017-11-19 (×2): 12.5 mg via ORAL
  Filled 2017-11-18 (×3): qty 1

## 2017-11-18 MED ORDER — ALBUTEROL SULFATE HFA 108 (90 BASE) MCG/ACT IN AERS: 2.0000 | INHALATION_SPRAY | Freq: Four times a day (QID) | RESPIRATORY_TRACT | Status: DC | PRN

## 2017-11-18 MED ORDER — LIDOCAINE HCL (CARDIAC) 20 MG/ML IV SOLN
INTRAVENOUS | Status: AC
  Filled 2017-11-18: qty 5

## 2017-11-18 MED ORDER — BUPIVACAINE HCL (PF) 0.5 % IJ SOLN
INTRAMUSCULAR | Status: DC | PRN
  Administered 2017-11-18: 18 mL

## 2017-11-18 MED ORDER — LACTATED RINGERS IV SOLN
INTRAVENOUS | Status: DC
  Administered 2017-11-18: 18:00:00 via INTRAVENOUS

## 2017-11-18 MED ORDER — SCOPOLAMINE 1 MG/3DAYS TD PT72
1.0000 | MEDICATED_PATCH | Freq: Once | TRANSDERMAL | Status: DC
  Administered 2017-11-18: 1.5 mg via TRANSDERMAL

## 2017-11-18 MED ORDER — FENTANYL CITRATE (PF) 100 MCG/2ML IJ SOLN
INTRAMUSCULAR | Status: DC | PRN
  Administered 2017-11-18 (×5): 50 ug via INTRAVENOUS

## 2017-11-18 MED ORDER — SODIUM CHLORIDE 0.9 % IJ SOLN
INTRAMUSCULAR | Status: DC | PRN
  Administered 2017-11-18: 100 mL

## 2017-11-18 MED ORDER — GLYCOPYRROLATE 0.2 MG/ML IJ SOLN
INTRAMUSCULAR | Status: AC
  Filled 2017-11-18: qty 1

## 2017-11-18 MED ORDER — CEFAZOLIN SODIUM-DEXTROSE 2-4 GM/100ML-% IV SOLN
2.0000 g | INTRAVENOUS | Status: AC
  Administered 2017-11-18: 2 g via INTRAVENOUS
  Filled 2017-11-18: qty 100

## 2017-11-18 MED ORDER — EPHEDRINE 5 MG/ML INJ
INTRAVENOUS | Status: AC
  Filled 2017-11-18: qty 10

## 2017-11-18 MED ORDER — ROCURONIUM BROMIDE 100 MG/10ML IV SOLN
INTRAVENOUS | Status: AC
  Filled 2017-11-18: qty 1

## 2017-11-18 MED ORDER — GLYCOPYRROLATE 0.2 MG/ML IJ SOLN
INTRAMUSCULAR | Status: DC | PRN
  Administered 2017-11-18: 0.1 mg via INTRAVENOUS

## 2017-11-18 MED ORDER — ONDANSETRON HCL 4 MG PO TABS: 4.0000 mg | ORAL_TABLET | Freq: Four times a day (QID) | ORAL | Status: DC | PRN

## 2017-11-18 MED ORDER — HYDROMORPHONE HCL 1 MG/ML IJ SOLN
0.2500 mg | INTRAMUSCULAR | Status: DC | PRN
  Administered 2017-11-18 (×3): 0.5 mg via INTRAVENOUS

## 2017-11-18 MED ORDER — LIDOCAINE HCL (CARDIAC) 20 MG/ML IV SOLN
INTRAVENOUS | Status: DC | PRN
  Administered 2017-11-18: 100 mg via INTRAVENOUS

## 2017-11-18 MED ORDER — SCOPOLAMINE 1 MG/3DAYS TD PT72
MEDICATED_PATCH | TRANSDERMAL | Status: AC
  Administered 2017-11-18: 1.5 mg via TRANSDERMAL
  Filled 2017-11-18: qty 1

## 2017-11-18 MED ORDER — ROCURONIUM BROMIDE 100 MG/10ML IV SOLN
INTRAVENOUS | Status: DC | PRN
  Administered 2017-11-18: 50 mg via INTRAVENOUS

## 2017-11-18 MED ORDER — ONDANSETRON HCL 4 MG/2ML IJ SOLN
INTRAMUSCULAR | Status: DC | PRN
  Administered 2017-11-18: 4 mg via INTRAVENOUS

## 2017-11-18 MED ORDER — IBUPROFEN 800 MG PO TABS
800.0000 mg | ORAL_TABLET | Freq: Three times a day (TID) | ORAL | Status: DC | PRN
  Administered 2017-11-19: 800 mg via ORAL
  Filled 2017-11-18: qty 1

## 2017-11-18 MED ORDER — KETOROLAC TROMETHAMINE 30 MG/ML IJ SOLN
INTRAMUSCULAR | Status: DC | PRN
  Administered 2017-11-18: 15 mg via INTRAVENOUS

## 2017-11-18 MED ORDER — SODIUM CHLORIDE 0.9 % IJ SOLN
INTRAMUSCULAR | Status: AC
  Filled 2017-11-18: qty 100

## 2017-11-18 MED ORDER — HYDROMORPHONE HCL 1 MG/ML IJ SOLN
INTRAMUSCULAR | Status: DC | PRN
  Administered 2017-11-18: 1 mg via INTRAVENOUS

## 2017-11-18 MED ORDER — ALBUTEROL SULFATE (2.5 MG/3ML) 0.083% IN NEBU: 3.0000 mL | INHALATION_SOLUTION | Freq: Four times a day (QID) | RESPIRATORY_TRACT | Status: DC | PRN

## 2017-11-18 MED ORDER — MIDAZOLAM HCL 2 MG/2ML IJ SOLN
INTRAMUSCULAR | Status: DC | PRN
  Administered 2017-11-18: 1 mg via INTRAVENOUS

## 2017-11-18 SURGICAL SUPPLY — 45 items
APL SRG 38 LTWT LNG FL B (MISCELLANEOUS)
APPLICATOR ARISTA FLEXITIP XL (MISCELLANEOUS) IMPLANT
APPLICATOR COTTON TIP 6IN STRL (MISCELLANEOUS) ×2 IMPLANT
CANISTER SUCT 3000ML PPV (MISCELLANEOUS) ×2 IMPLANT
CLOTH BEACON ORANGE TIMEOUT ST (SAFETY) ×2 IMPLANT
CONT PATH 16OZ SNAP LID 3702 (MISCELLANEOUS) ×2 IMPLANT
COVER MAYO STAND STRL (DRAPES) ×2 IMPLANT
DECANTER SPIKE VIAL GLASS SM (MISCELLANEOUS) ×4 IMPLANT
DRSG OPSITE POSTOP 3X4 (GAUZE/BANDAGES/DRESSINGS) ×2 IMPLANT
DURAPREP 26ML APPLICATOR (WOUND CARE) ×2 IMPLANT
ELECT REM PT RETURN 9FT ADLT (ELECTROSURGICAL) ×2
ELECTRODE REM PT RTRN 9FT ADLT (ELECTROSURGICAL) IMPLANT
GLOVE BIO SURGEON STRL SZ 6.5 (GLOVE) ×6 IMPLANT
GLOVE BIOGEL PI IND STRL 7.0 (GLOVE) ×2 IMPLANT
GLOVE BIOGEL PI INDICATOR 7.0 (GLOVE) ×2
HEMOSTAT ARISTA ABSORB 3G PWDR (MISCELLANEOUS) IMPLANT
LEGGING LITHOTOMY PAIR STRL (DRAPES) ×2 IMPLANT
NDL SAFETY ECLIPSE 18X1.5 (NEEDLE) ×1 IMPLANT
NDL SPNL 18GX3.5 QUINCKE PK (NEEDLE) ×1 IMPLANT
NEEDLE HYPO 18GX1.5 SHARP (NEEDLE) ×2
NEEDLE INSUFFLATION 120MM (ENDOMECHANICALS) ×2 IMPLANT
NEEDLE MAYO .5 CIRCLE (NEEDLE) ×2 IMPLANT
NEEDLE SPNL 18GX3.5 QUINCKE PK (NEEDLE) ×2 IMPLANT
NS IRRIG 1000ML POUR BTL (IV SOLUTION) ×2 IMPLANT
PACK LAVH (CUSTOM PROCEDURE TRAY) ×2 IMPLANT
PACK ROBOTIC GOWN (GOWN DISPOSABLE) ×2 IMPLANT
PACK TRENDGUARD 450 HYBRID PRO (MISCELLANEOUS) IMPLANT
PACK TRENDGUARD 600 HYBRD PROC (MISCELLANEOUS) IMPLANT
PROTECTOR NERVE ULNAR (MISCELLANEOUS) ×4 IMPLANT
SET IRRIG TUBING LAPAROSCOPIC (IRRIGATION / IRRIGATOR) IMPLANT
SHEARS HARMONIC ACE PLUS 36CM (ENDOMECHANICALS) IMPLANT
SLEEVE XCEL OPT CAN 5 100 (ENDOMECHANICALS) ×3 IMPLANT
SUT VIC AB 0 CT1 36 (SUTURE) ×2 IMPLANT
SUT VIC AB 2-0 CT1 18 (SUTURE) ×6 IMPLANT
SUT VICRYL 0 UR6 27IN ABS (SUTURE) ×4 IMPLANT
SUT VICRYL 4-0 PS2 18IN ABS (SUTURE) ×4 IMPLANT
SYR 30ML LL (SYRINGE) ×2 IMPLANT
SYR BULB IRRIGATION 50ML (SYRINGE) ×2 IMPLANT
TOWEL OR 17X24 6PK STRL BLUE (TOWEL DISPOSABLE) ×6 IMPLANT
TRAY FOLEY CATH SILVER 14FR (SET/KITS/TRAYS/PACK) ×2 IMPLANT
TRENDGUARD 450 HYBRID PRO PACK (MISCELLANEOUS) ×2
TRENDGUARD 600 HYBRID PROC PK (MISCELLANEOUS)
TROCAR OPTI TIP 5M 100M (ENDOMECHANICALS) ×2 IMPLANT
TROCAR XCEL DIL TIP R 11M (ENDOMECHANICALS) ×2 IMPLANT
WARMER LAPAROSCOPE (MISCELLANEOUS) ×2 IMPLANT

## 2017-11-18 NOTE — Op Note (Signed)
11/18/2017  11:30 AM  PATIENT:  [name]  36 y.o. female  PRE-OPERATIVE DIAGNOSIS:  Pelvic Pain Fibroid Dyspareunia  POST-OPERATIVE DIAGNOSIS:  Pelvic Pain Fibroid Dyspareunia  PROCEDURE:  Procedure(s): LAPAROSCOPIC ASSISTED VAGINAL HYSTERECTOMY WITH SALPINGECTOMY (Left)  SURGEON:  Surgeon(s) and Role:    Hulan Fray, Wilhemina Cash, MD - Primary    * Sloan Leiter, MD - Assisting  ASSISTANTS: Senaida Lange, MS3   ANESTHESIA:   local and general  EBL:  100 mL   BLOOD ADMINISTERED:none  DRAINS: none   LOCAL MEDICATIONS USED:  MARCAINE     SPECIMEN:  Source of Specimen:  uterus with cervix, left oviduct  DISPOSITION OF SPECIMEN:  PATHOLOGY  COUNTS:  YES  TOURNIQUET:  * No tourniquets in log *  DICTATION: .Dragon Dictation  PLAN OF CARE: Admit to inpatient   PATIENT DISPOSITION:  PACU - hemodynamically stable.   Delay start of Pharmacological VTE agent (>24hrs) due to surgical blood loss or risk of bleeding: not applicable   The risks, benefits, and alternatives of surgery were explained, accepted, and understood. Consents were signed. She was taken to the operating room and placed in the dorsal lithotomy position. When she was comfortable, general anesthesia was applied without complication. Her abdomen and vagina were prepped and draped in the usual sterile fashion. A bimanual exam revealed a small anteverted uterus, her adnexa were not palpable. A Hulka manipulator was placed. A latex free catheter was placed and a drained clear urine throughout the case. Gloves were changed and attention was turned to the abdomen. 0.5% Marcaine was used to inject at all the incision sites prior to making an incision. A 5 mm incision was made in the umbilicus. A varies needle was placed intraperitoneally. Low-flow CO2 was used to insufflate the abdomen to approximately 3 L. Patient pressure was always less than 15.  After an excellent pneumoperitoneum was established a 5 mm trocar  was placed. Laparoscopy confirmed correct placement. Under direct laparoscopic visualization I placed a 5 mm port in each lower quadrant. She was placed in the Trendelenburg position. The pelvis was inspected. Both adnexa were very small. Her uterus was noted to have a pedunculated fundal fibroid. The right oviduct was surgically absent.   We used a harmonic scalpel to ligate the uteroovarian ligaments on each side. The round ligaments were ligated with the harmonic scalpel as well. I then moved to the vaginal approach. I used a solution of 30cc of 0.5% marcaine with 100 cc of normal saline to hydrodisect at the cervicovaginal junction. I made a circumferential incision at the site. The posterior peritoneum was entered sharply and a long weighted speculum was placed. I ligated, tagged, and held the uterosacral ligaments. 2-0 vicryl suture is used throughout unless otherwise specified.  I entered the anterior peritoneum as well. I then continued to separate the uterus from its pelvic attachments. The uterus and left tube were removed en bloc. Excellent hemostasis was maintained throughout. I used the tags from the uterosacral ligaments to provide angle sutures for vaginal cuff support. I closed the peritoneum with a 2-0 vicryl suture in a pursestring fashion.  I closed the vaginal cuff in a vertical fashion with a 0 vicryl suture in a running, locking fashion. Gloves were changed and attention was turned back to the abdomen. The pelvis was inspected and noted to be hemostatic. The CO2 was allowed to escape from the abdomen.  A subcuticular closure was done with 4-0 Vicryl at all of the skin  incisions. Steri-Strips were placed across the incision. She was extubated and taken to the recovery room in stable condition.

## 2017-11-18 NOTE — H&P (Signed)
[name] is an 36yo M AA P3 here with the complaint of increasing pelvic pain, pressure, urge to void/defecate. She has also been having dyspareunia. She hasn't had sex at least a month due to the pain. She reports almost daily vaginal bleeding. She has tried ablation, depo provera, OCPs, IUD, all with no relief.    Patient's last menstrual period was 11/14/2017.    Past Medical History:  Diagnosis Date  . Anxiety   . Arthritis   . Asthma    meds as needed  . Bronchitis   . Chronic back pain   . Ectopic pregnancy    requiring surgery  . Elevated liver enzymes   . GERD (gastroesophageal reflux disease)   . Gonorrhea   . Hypertension   . Irritable bowel   . Migraine headache   . Peripheral vascular disease ([place])   . Pneumonia    2017  . Preterm labor   . 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 DILITATION & CURRETTAGE/HYSTROSCOPY WITH NOVASURE ABLATION N/A 02/20/2016   Procedure: HYSTEROSCOPY WITH NOVASURE ABLATION;  Surgeon: [name], MD;  Location: [facility];  Service: Gynecology;  Laterality: N/A;  . LAPAROSCOPY  05/30/2012   Procedure: LAPAROSCOPY OPERATIVE;  Surgeon: Osborne Oman, MD;  Location: [facility];  Service: Gynecology;  Laterality: N/A;  Operative laparoscopy right salpingectomy and removal of ectopic pregnancy  . LAPAROSCOPY FOR ECTOPIC PREGNANCY    . SALPINGECTOMY    . TUBAL LIGATION  2005  . WISDOM TOOTH EXTRACTION      Family History  Problem Relation Age of Onset  . Asthma Brother   . Breast cancer Cousin     Social History:  reports that she quit smoking about 18 months ago. Her smoking use included cigarettes. She has a 5.00 pack-year smoking history. she has never used smokeless tobacco. She reports that she drinks about 0.6 oz of alcohol per week. She reports that she does not use drugs.  Allergies:  Allergies  Allergen Reactions  . Latex Hives  .  Raspberry Hives, Itching and Swelling  . Vioxx [Rofecoxib] Hives    Medications Prior to Admission  Medication Sig Dispense Refill Last Dose  . albuterol (PROVENTIL HFA;VENTOLIN HFA) 108 (90 BASE) MCG/ACT inhaler Inhale 1-2 puffs into the lungs every 6 (six) hours as needed for wheezing or shortness of breath. (Patient taking differently: Inhale 2 puffs every 6 (six) hours as needed into the lungs for wheezing or shortness of breath. ) 1 Inhaler 0 Past Week at Unknown time  . citalopram (CELEXA) 10 MG tablet Take 10 mg every evening by mouth.  2 11/17/2017 at Unknown time  . diclofenac (VOLTAREN) 75 MG EC tablet Take 75 mg 2 (two) times daily as needed by mouth for mild pain.   2 11/17/2017 at Unknown time  . hydrochlorothiazide (HYDRODIURIL) 12.5 MG tablet Take 12.5 mg daily by mouth.  2 11/17/2017 at Unknown time  . tiZANidine (ZANAFLEX) 4 MG tablet Take 4 mg 2 (two) times daily as needed by mouth for muscle spasms.   11/17/2017 at Unknown time  . ibuprofen (ADVIL,MOTRIN) 600 MG tablet Take 1 tablet (600 mg total) by mouth every 6 (six) hours as needed. (Patient taking differently: Take 600 mg every 6 (six) hours as needed by mouth for headache or moderate pain. ) 30 tablet 1 More than a month at Unknown time    ROS  Drives  a dump truck Married for 12 years  Pulse 69, temperature 98.2 F (36.8 C), temperature source Oral, resp. rate 14, last menstrual period 11/14/2017, SpO2 100 %. Physical Exam  Heart- rrr Lungs- CTAB Abd- benign  Results for orders placed or performed during the hospital encounter of 11/18/17 (from the past 24 hour(s))  Pregnancy, urine     Status: None   Collection Time: 11/18/17  7:43 AM  Result Value Ref Range   Preg Test, Ur NEGATIVE NEGATIVE    No results found. IMPRESSION: 1. Fibroid uterus with the largest increased in size since previous exam currently estimated at 3.8 x 3.2 x 3.5 cm, anteriorly on the left. This is slightly submucosal in appearance  impressing upon the endometrial lining. Previously this measured 2.4 x 2.1 x 1.9 cm. 2. A posterior subserosal fibroid is also noted measuring 1.5 x 1.5 x 1.7 cm.  Assessment/Plan:  Fibroids, pelvic pain, DUB- plan for LAVH/BS.  She understands the risks of surgery, including, but not to infection, bleeding, DVTs, damage to bowel, bladder, ureters. She wishes to proceed.    Emily Filbert 11/18/2017, 9:11 AM

## 2017-11-18 NOTE — Anesthesia Postprocedure Evaluation (Signed)
Anesthesia Post Note  Patient: Angelea Penny Hope-Alston  Procedure(s) Performed: LAPAROSCOPIC ASSISTED VAGINAL HYSTERECTOMY WITH SALPINGECTOMY (Left )     Patient location during evaluation: PACU Anesthesia Type: General Level of consciousness: awake and alert Pain management: pain level controlled Vital Signs Assessment: post-procedure vital signs reviewed and stable Respiratory status: spontaneous breathing, nonlabored ventilation and respiratory function stable Cardiovascular status: blood pressure returned to baseline and stable Postop Assessment: no apparent nausea or vomiting Anesthetic complications: no    Last Vitals:  Vitals:   11/18/17 1325 11/18/17 1425  BP: (!) 140/94 (!) 148/86  Pulse: 75 82  Resp: 16 18  Temp: 36.7 C 37.1 C  SpO2: 95% 100%    Pain Goal: Patients Stated Pain Goal: 3 (11/18/17 1325)               Audry Pili

## 2017-11-18 NOTE — Anesthesia Procedure Notes (Signed)
Procedure Name: Intubation Date/Time: 11/18/2017 10:13 AM Performed by: Georgeanne Nim, CRNA Pre-anesthesia Checklist: Patient identified, Emergency Drugs available, Suction available, Patient being monitored and Timeout performed Patient Re-evaluated:Patient Re-evaluated prior to induction Oxygen Delivery Method: Circle system utilized Preoxygenation: Pre-oxygenation with 100% oxygen Induction Type: IV induction Ventilation: Mask ventilation without difficulty and Oral airway inserted - appropriate to patient size Laryngoscope Size: Mac and 3 Grade View: Grade I Tube size: 7.0 mm Number of attempts: 1 Airway Equipment and Method: Patient positioned with wedge pillow and Stylet Placement Confirmation: ETT inserted through vocal cords under direct vision,  positive ETCO2,  CO2 detector and breath sounds checked- equal and bilateral Secured at: 22 cm Tube secured with: Tape Dental Injury: Teeth and Oropharynx as per pre-operative assessment

## 2017-11-18 NOTE — Transfer of Care (Signed)
Immediate Anesthesia Transfer of Care Note  Patient: [name]  Procedure(s) Performed: LAPAROSCOPIC ASSISTED VAGINAL HYSTERECTOMY WITH SALPINGECTOMY (Left )  Patient Location: PACU  Anesthesia Type:General  Level of Consciousness: awake and patient cooperative  Airway & Oxygen Therapy: Patient Spontanous Breathing and Patient connected to nasal cannula oxygen  Post-op Assessment: Report given to RN and Post -op Vital signs reviewed and stable  Post vital signs: Reviewed and stable  Last Vitals:  Vitals:   11/18/17 0759  Pulse: 69  Resp: 14  Temp: 36.8 C  SpO2: 100%    Last Pain:  Vitals:   11/18/17 0759  TempSrc: Oral         Complications: No apparent anesthesia complications

## 2017-11-18 NOTE — Anesthesia Postprocedure Evaluation (Signed)
Anesthesia Post Note  Patient: [name]  Procedure(s) Performed: LAPAROSCOPIC ASSISTED VAGINAL HYSTERECTOMY WITH SALPINGECTOMY (Left )     Patient location during evaluation: [facility] Anesthesia Type: General Level of consciousness: awake and alert and oriented Pain management: satisfactory to patient Vital Signs Assessment: post-procedure vital signs reviewed and stable Respiratory status: spontaneous breathing and respiratory function stable Cardiovascular status: stable Postop Assessment: adequate PO intake Anesthetic complications: no    Last Vitals:  Vitals:   11/18/17 1325 11/18/17 1425  BP: (!) 140/94 (!) 148/86  Pulse: 75 82  Resp: 16 18  Temp: 36.7 C 37.1 C  SpO2: 95% 100%    Last Pain:  Vitals:   11/18/17 1425  TempSrc: Oral  PainSc: 10-Worst pain ever   Pain Goal: Patients Stated Pain Goal: 3 (11/18/17 1325)               Khilee Hendricksen

## 2017-11-18 NOTE — Anesthesia Preprocedure Evaluation (Addendum)
Anesthesia Evaluation  Patient identified by MRN, date of birth, ID band Patient awake    Reviewed: Allergy & Precautions, NPO status , Patient's Chart, lab work & pertinent test results  Airway Mallampati: II  TM Distance: >3 FB Neck ROM: Full    Dental  (+) Dental Advisory Given   Pulmonary asthma , Current Smoker,    breath sounds clear to auscultation       Cardiovascular hypertension, Pt. on medications  Rhythm:Regular Rate:Normal     Neuro/Psych  Headaches, Seizures -,     GI/Hepatic Neg liver ROS, GERD  ,  Endo/Other  negative endocrine ROS  Renal/GU negative Renal ROS     Musculoskeletal  (+) Arthritis ,   Abdominal   Peds  Hematology negative hematology ROS (+)   Anesthesia Other Findings   Reproductive/Obstetrics                            Lab Results  Component Value Date   WBC 6.5 11/05/2017   HGB 13.3 11/05/2017   HCT 38.2 11/05/2017   MCV 88.4 11/05/2017   PLT 229 11/05/2017   Lab Results  Component Value Date   CREATININE 0.59 11/05/2017   BUN 10 11/05/2017   NA 131 (L) 11/05/2017   K 3.2 (L) 11/05/2017   CL 100 (L) 11/05/2017   CO2 23 11/05/2017    Anesthesia Physical Anesthesia Plan  ASA: II  Anesthesia Plan: General   Post-op Pain Management:    Induction: Intravenous  PONV Risk Score and Plan: 4 or greater and Ondansetron, Dexamethasone, Midazolam, Scopolamine patch - Pre-op and Treatment may vary due to age or medical condition  Airway Management Planned: Oral ETT  Additional Equipment:   Intra-op Plan:   Post-operative Plan: Extubation in OR  Informed Consent: I have reviewed the patients History and Physical, chart, labs and discussed the procedure including the risks, benefits and alternatives for the proposed anesthesia with the patient or authorized representative who has indicated his/her understanding and acceptance.   Dental advisory  given  Plan Discussed with: CRNA  Anesthesia Plan Comments:         Anesthesia Quick Evaluation

## 2017-11-18 NOTE — Addendum Note (Signed)
Addendum  created 11/18/17 1643 by Flossie Dibble, CRNA   Sign clinical note

## 2017-11-19 ENCOUNTER — Encounter (HOSPITAL_COMMUNITY): Payer: Self-pay | Admitting: Obstetrics & Gynecology

## 2017-11-19 DIAGNOSIS — N880 Leukoplakia of cervix uteri: Secondary | ICD-10-CM | POA: Diagnosis not present

## 2017-11-19 LAB — CBC
HEMATOCRIT: 34.1 % — AB (ref 36.0–46.0)
Hemoglobin: 11.5 g/dL — ABNORMAL LOW (ref 12.0–15.0)
MCH: 30.5 pg (ref 26.0–34.0)
MCHC: 33.7 g/dL (ref 30.0–36.0)
MCV: 90.5 fL (ref 78.0–100.0)
PLATELETS: 245 10*3/uL (ref 150–400)
RBC: 3.77 MIL/uL — AB (ref 3.87–5.11)
RDW: 13.1 % (ref 11.5–15.5)
WBC: 11.7 10*3/uL — AB (ref 4.0–10.5)

## 2017-11-19 MED ORDER — IBUPROFEN 600 MG PO TABS: 600.0000 mg | ORAL_TABLET | Freq: Four times a day (QID) | ORAL | 1 refills | Status: DC | PRN

## 2017-11-19 MED ORDER — OXYCODONE-ACETAMINOPHEN 5-325 MG PO TABS: 1.0000 | ORAL_TABLET | ORAL | 0 refills | Status: DC | PRN

## 2017-11-19 NOTE — Discharge Summary (Signed)
Physician Discharge Summary  Patient ID: [name] MRN: [id number] DOB/AGE: 1981-12-14 36 y.o.  Admit date: 11/18/2017 Discharge date: 11/19/2017  Admission Diagnoses: fibroids, pelvic pain  Discharge Diagnoses: same Active Problems:   Post-operative state   Discharged Condition: good  Hospital Course: She underwent an uncomplicated LAVH/Left salpingectomy. By POD #1 she was tolerating po well, voiding, ambulating, and she voiced her readiness to go home.   Consults: None  Significant Diagnostic Studies: labs: post op hbg was 11.5, pre op 13.3  Treatments: surgery: as above  Discharge Exam: Blood pressure 139/77, pulse 76, temperature 98.2 F (36.8 C), temperature source Oral, resp. rate 18, height 5\' 4"  (1.626 m), weight 93 kg (205 lb 2 oz), last menstrual period 11/14/2017, SpO2 100 %. General appearance: alert Resp: clear to auscultation bilaterally Cardio: regular rate and rhythm, S1, S2 normal, no murmur, click, rub or gallop GI: soft, non-tender; bowel sounds normal; no masses,  no organomegaly  Honeycomb dressings over port sites are clean/dry/intact  Disposition: 01-Home or Self Care   Allergies as of 11/19/2017      Reactions   Latex Hives   Raspberry Hives, Itching, Swelling   Vioxx [rofecoxib] Hives      Medication List    TAKE these medications   albuterol 108 (90 Base) MCG/ACT inhaler Commonly known as:  PROVENTIL HFA;VENTOLIN HFA Inhale 1-2 puffs into the lungs every 6 (six) hours as needed for wheezing or shortness of breath. What changed:  how much to take   citalopram 10 MG tablet Commonly known as:  CELEXA Take 10 mg every evening by mouth.   diclofenac 75 MG EC tablet Commonly known as:  VOLTAREN Take 75 mg 2 (two) times daily as needed by mouth for mild pain.   hydrochlorothiazide 12.5 MG tablet Commonly known as:  HYDRODIURIL Take 12.5 mg daily by mouth.   ibuprofen 600 MG tablet Commonly known as:  ADVIL,MOTRIN Take 1  tablet (600 mg total) by mouth every 6 (six) hours as needed.   oxyCODONE-acetaminophen 5-325 MG tablet Commonly known as:  PERCOCET/ROXICET Take 1-2 tablets by mouth every 4 (four) hours as needed for severe pain (moderate to severe pain (when tolerating fluids)).   tiZANidine 4 MG tablet Commonly known as:  ZANAFLEX Take 4 mg 2 (two) times daily as needed by mouth for muscle spasms.      Follow-up Information    Emily Filbert, MD. Schedule an appointment as soon as possible for a visit in 6 weeks.   Specialty:  Obstetrics and Gynecology Contact information: [place] [address] [phone]           Signed: Emily Filbert 11/19/2017, 1:05 PM

## 2017-11-19 NOTE — Progress Notes (Signed)
Discharged to home  Teaching complete

## 2017-11-19 NOTE — Discharge Instructions (Signed)
Laparoscopically Assisted Vaginal Hysterectomy, Care After °Refer to this sheet in the next few weeks. These instructions provide you with information on caring for yourself after your procedure. Your health care provider may also give you more specific instructions. Your treatment has been planned according to current medical practices, but problems sometimes occur. Call your health care provider if you have any problems or questions after your procedure. °What can I expect after the procedure? °After your procedure, it is typical to have the following: °· Abdominal pain. You will be given pain medicine to control it. °· Sore throat from the breathing tube that was inserted during surgery. ° °Follow these instructions at home: °· Only take over-the-counter or prescription medicines for pain, discomfort, or fever as directed by your health care provider. °· Do not take aspirin. It can cause bleeding. °· Do not drive when taking pain medicine. °· Follow your health care provider's advice regarding diet, exercise, lifting, driving, and general activities. °· Resume your usual diet as directed and allowed. °· Get plenty of rest and sleep. °· Do not douche, use tampons, or have sexual intercourse for at least 6 weeks, or until your health care provider gives you permission. °· Change your bandages (dressings) as directed by your health care provider. °· Monitor your temperature and notify your health care provider of a fever. °· Take showers instead of baths for 2-3 weeks. °· Do not drink alcohol until your health care provider gives you permission. °· If you develop constipation, you may take a mild laxative with your health care provider's permission. Bran foods may help with constipation problems. Drinking enough fluids to keep your urine clear or pale yellow may help as well. °· Try to have someone home with you for 1-2 weeks to help around the house. °· Keep all of your follow-up appointments as directed by your  health care provider. °Contact a health care provider if: °· You have swelling, redness, or increasing pain around your incision sites. °· You have pus coming from your incision. °· You notice a bad smell coming from your incision. °· Your incision breaks open. °· You feel dizzy or lightheaded. °· You have pain or bleeding when you urinate. °· You have persistent diarrhea. °· You have persistent nausea and vomiting. °· You have abnormal vaginal discharge. °· You have a rash. °· You have any type of abnormal reaction or develop an allergy to your medicine. °· You have poor pain control with your prescribed medicine. °Get help right away if: °· You have a fever. °· You have severe abdominal pain. °· You have chest pain. °· You have shortness of breath. °· You faint. °· You have pain, swelling, or redness in your leg. °· You have heavy vaginal bleeding with blood clots. °This information is not intended to replace advice given to you by your health care provider. Make sure you discuss any questions you have with your health care provider. °Document Released: 11/28/2011 Document Revised: 05/16/2016 Document Reviewed: 06/24/2013 °Elsevier Interactive Patient Education © 2017 [employer] ° °

## 2017-11-24 ENCOUNTER — Ambulatory Visit: Payer: Medicaid Other | Admitting: Family Medicine

## 2017-11-24 ENCOUNTER — Telehealth: Payer: Self-pay | Admitting: *Deleted

## 2017-11-24 NOTE — Telephone Encounter (Signed)
Received message left on nurse voicemail on 11/21/17 at 1443.  Patient's husband also left message on voicemail on 11/21/17 at 1646.  Patient states her incision is draining what looks like pus and it is warm.  I spoke with Dr. Hulan Fray.  She would like for patient to be seen today.  Phoned patient.  Was disconnected twice.  Offered patient appointment with Dr. Hulan Fray in our [place] office this afternoon.  Patient unable to go to [place] office.  Offered to come to our office and be seen by another provider.  Patient states she can be here at 3 pm.  Explained to patient we will be working her in.  Patient states understanding.

## 2017-11-25 ENCOUNTER — Ambulatory Visit (INDEPENDENT_AMBULATORY_CARE_PROVIDER_SITE_OTHER): Payer: Self-pay | Admitting: Clinical

## 2017-11-25 ENCOUNTER — Encounter: Payer: Self-pay | Admitting: Obstetrics & Gynecology

## 2017-11-25 ENCOUNTER — Ambulatory Visit (INDEPENDENT_AMBULATORY_CARE_PROVIDER_SITE_OTHER): Payer: Self-pay | Admitting: Obstetrics & Gynecology

## 2017-11-25 VITALS — BP 130/80 | HR 98 | Temp 97.6°F | Ht 64.0 in | Wt 207.0 lb

## 2017-11-25 DIAGNOSIS — Z9889 Other specified postprocedural states: Secondary | ICD-10-CM

## 2017-11-25 DIAGNOSIS — F419 Anxiety disorder, unspecified: Secondary | ICD-10-CM

## 2017-11-25 DIAGNOSIS — Z4889 Encounter for other specified surgical aftercare: Secondary | ICD-10-CM

## 2017-11-25 NOTE — Progress Notes (Signed)
Patient verbally consented to meet with Behavioral Health Clinician about presenting concerns. Dr Harraway Smith aware   

## 2017-11-25 NOTE — Patient Instructions (Signed)
Laparoscopically Assisted Vaginal Hysterectomy, Care After °Refer to this sheet in the next few weeks. These instructions provide you with information on caring for yourself after your procedure. Your health care provider may also give you more specific instructions. Your treatment has been planned according to current medical practices, but problems sometimes occur. Call your health care provider if you have any problems or questions after your procedure. °What can I expect after the procedure? °After your procedure, it is typical to have the following: °· Abdominal pain. You will be given pain medicine to control it. °· Sore throat from the breathing tube that was inserted during surgery. ° °Follow these instructions at home: °· Only take over-the-counter or prescription medicines for pain, discomfort, or fever as directed by your health care provider. °· Do not take aspirin. It can cause bleeding. °· Do not drive when taking pain medicine. °· Follow your health care provider's advice regarding diet, exercise, lifting, driving, and general activities. °· Resume your usual diet as directed and allowed. °· Get plenty of rest and sleep. °· Do not douche, use tampons, or have sexual intercourse for at least 6 weeks, or until your health care provider gives you permission. °· Change your bandages (dressings) as directed by your health care provider. °· Monitor your temperature and notify your health care provider of a fever. °· Take showers instead of baths for 2-3 weeks. °· Do not drink alcohol until your health care provider gives you permission. °· If you develop constipation, you may take a mild laxative with your health care provider's permission. Bran foods may help with constipation problems. Drinking enough fluids to keep your urine clear or pale yellow may help as well. °· Try to have someone home with you for 1-2 weeks to help around the house. °· Keep all of your follow-up appointments as directed by your  health care provider. °Contact a health care provider if: °· You have swelling, redness, or increasing pain around your incision sites. °· You have pus coming from your incision. °· You notice a bad smell coming from your incision. °· Your incision breaks open. °· You feel dizzy or lightheaded. °· You have pain or bleeding when you urinate. °· You have persistent diarrhea. °· You have persistent nausea and vomiting. °· You have abnormal vaginal discharge. °· You have a rash. °· You have any type of abnormal reaction or develop an allergy to your medicine. °· You have poor pain control with your prescribed medicine. °Get help right away if: °· You have a fever. °· You have severe abdominal pain. °· You have chest pain. °· You have shortness of breath. °· You faint. °· You have pain, swelling, or redness in your leg. °· You have heavy vaginal bleeding with blood clots. °This information is not intended to replace advice given to you by your health care provider. Make sure you discuss any questions you have with your health care provider. °Document Released: 11/28/2011 Document Revised: 05/16/2016 Document Reviewed: 06/24/2013 °Elsevier Interactive Patient Education © 2017 [employer] Inc[employer] ° °

## 2017-11-25 NOTE — BH Specialist Note (Signed)
Integrated Behavioral Health Initial Visit  MRN: [id number] Name: [name] [facility]  Number of [place] Clinician visits:: 1/6 Session Start time: 9:20  Session End time: 9:46 Total time: 30 minutes  Type of Service: [place] Interpretor:No. Interpretor Name and Language: n/a   Warm Hand Off Completed.       SUBJECTIVE: [name] is a 36 y.o. female accompanied by n/a Patient was referred by Dr Ihor Dow for anxiety. Patient reports the following symptoms/concerns: Pt states her primary concern today is experiencing panic attacks with anxiety, and excessive worry that is interfering with sleep,  Celexa has recently been increased by PCP; pt open to learning additional self-coping strategies.  Duration of problem: Increasing in recent months; Severity of problem: severe  OBJECTIVE: Mood: Anxious and Affect: Appropriate Risk of harm to self or others: No plan to harm self or others  LIFE CONTEXT: Family and Social: Pt lives with husband and teenaged children School/Work: Works fulltime driving truck Self-Care: Sets down boundaries to protect her quiet time; sometimes uses sound machine Life Changes: Recent surgery  GOALS ADDRESSED: Patient will: 1. Reduce symptoms of: agitation and anxiety 2. Increase knowledge and/or ability of: self-management skills  3. Demonstrate ability to: Increase healthy adjustment to current life circumstances 9am  INTERVENTIONS: Interventions utilized: Mindfulness or Psychologist, educational, Sleep Hygiene and Psychoeducation and/or Health Education  Standardized Assessments completed: GAD-7 and PHQ 9  ASSESSMENT: Patient currently experiencing Unspecified anxiety disorder   Patient may benefit from psychoeducation and brief therapeutic intervention regarding coping with symptoms of anxiety.  PLAN: 1. Follow up with behavioral health clinician on : One  month 2. Behavioral recommendations:  -Continue taking Celexa, as prescribed by medical provider -Use sound machine every night at bedtime -CALM relaxation breathing exercise every morning, at bedtime, and prior to driving, as long as it remains helpful to cope 3. Referral(s): [place] (In [facility]) 4. "From scale of 1-10, how likely are you to follow plan?": 9  Garlan Fair, LCSW  Depression screen [facility] 2/9 11/25/2017  Decreased Interest 3  Down, Depressed, Hopeless 1  PHQ - 2 Score 4  Altered sleeping 3  Tired, decreased energy 3  Change in appetite 3  Feeling bad or failure about yourself  0  Trouble concentrating 0  Moving slowly or fidgety/restless 0  Suicidal thoughts 0  PHQ-9 Score 13   GAD 7 : Generalized Anxiety Score 11/25/2017  Nervous, Anxious, on Edge 3  Control/stop worrying 3  Worry too much - different things 3  Trouble relaxing 3  Restless 0  Easily annoyed or irritable 3  Afraid - awful might happen 3  Total GAD 7 Score 18

## 2017-11-25 NOTE — Progress Notes (Signed)
History:  36 y.o. [id number] here today for wound check. Pt is s/p LAVH with salpingectomy on 11/18/2017 with Dr Hulan Fray.  She reports that her daughter thought she saw pus and 'something white'  She denies pain or fever or chills. She has been voiding and passing stools.  The following portions of the patient's history were reviewed and updated as appropriate: allergies, current medications, past family history, past medical history, past social history, past surgical history and problem list.  Review of Systems:  Pertinent items are noted in HPI.   Objective:  Physical Exam Last menstrual period 11/14/2017.  CONSTITUTIONAL: Well-developed, well-nourished female in no acute distress.  HENT:  Normocephalic, atraumatic EYES: Conjunctivae and EOM are normal. No scleral icterus.  NECK: Normal range of motion SKIN: Skin is warm and dry. No rash noted. Not diaphoretic.No pallor. [place]: Alert and oriented to person, place, and time. Normal coordination.  Abd: Soft, nontender and nondistended; all ports site healing without difficulty.  Pelvic: not done.   Assessment & Plan:  Wound check  F/u to see Dr. Hulan Fray as prev scheduled.  Behavioral health consult today  Tobias Alexander. Harraway-Smith, M.D., [name]

## 2017-11-30 ENCOUNTER — Encounter: Payer: Self-pay | Admitting: Obstetrics & Gynecology

## 2017-12-31 ENCOUNTER — Ambulatory Visit: Payer: Self-pay

## 2017-12-31 NOTE — BH Specialist Note (Deleted)
Integrated Behavioral Health Follow Up Visit  MRN: [id number] Name: [name] [facility]  Number of [place] Clinician visits: 2/6 Session Start time: ***  Session End time: *** Total time: {IBH Total Time:21014050}  Type of Service: [place] Interpretor:No. Interpretor Name and Language: n/a *** SUBJECTIVE: [name] is a 37 y.o. female accompanied by {Patient accompanied by:[id number]} Patient was referred by Dr Ihor Dow for anxiety. Patient reports the following symptoms/concerns: *** Duration of problem: ***; Severity of problem: {Mild/Moderate/Severe:20260}  OBJECTIVE: Mood: {BHH MOOD:22306} and Affect: {BHH AFFECT:22307} Risk of harm to self or others: No plan to harm self or others  LIFE CONTEXT: Family and Social: Lives with husband and teenage children School/Work: Works full-time as Buyer, retail: Sets boundaries with family to protect her quiet time; sound machine Life Changes: Post-surgery  GOALS ADDRESSED: Patient will: 1.  Reduce symptoms of: {IBH Symptoms:21014056}  2.  Increase knowledge and/or ability of: {IBH Patient Tools:21014057}  3.  Demonstrate ability to: {IBH Goals:21014053}  INTERVENTIONS: Interventions utilized:  {IBH Interventions:21014054} Standardized Assessments completed: {IBH Screening Tools:21014051}  ASSESSMENT: Patient currently experiencing ***.   Patient may benefit from ***.  PLAN: 1. Follow up with behavioral health clinician on : *** 2. Behavioral recommendations: *** 3. Referral(s): {IBH Referrals:21014055} 4. "From scale of 1-10, how likely are you to follow plan?": ***  Caroleen Hamman Kanisha Duba, LCSW

## 2018-01-07 ENCOUNTER — Encounter: Payer: Self-pay | Admitting: Obstetrics & Gynecology

## 2018-01-07 ENCOUNTER — Ambulatory Visit: Payer: Medicaid Other | Admitting: Obstetrics & Gynecology

## 2018-01-07 VITALS — BP 139/83 | HR 81 | Wt 206.1 lb

## 2018-01-07 DIAGNOSIS — K625 Hemorrhage of anus and rectum: Secondary | ICD-10-CM

## 2018-01-07 DIAGNOSIS — Z9889 Other specified postprocedural states: Secondary | ICD-10-CM

## 2018-01-07 DIAGNOSIS — N3946 Mixed incontinence: Secondary | ICD-10-CM

## 2018-01-07 NOTE — Progress Notes (Signed)
   Subjective:    Patient ID: [name], female    DOB: 10-05-1981, 37 y.o.   MRN: [id number]  HPI 37 yo lady here for a post op visit. She had an uncomplicated LAVH/BS 8 weeks ago. She reports mixed urinary  incontinence "since surgery", worse in the last week. She also reports rectal bleeding for several weeks. She has been using over the counter hemorrhoid treatments. She has been sexually active.   Review of Systems     Objective:   Physical Exam Breathing, conversing, and ambulating normally Well nourished, well hydrated Black female, no apparent distress Abd- benign Incisions- healed well Vaginal cuff - appears bruised (she had sex recently) Bimanual exam normal, no masses or tenderness No urine seen in the vagina No external hemorrhoids noted     Assessment & Plan:  Post op state- stable Mixed incontinence- referral to urology Rectal bleeding- referral to GI

## 2018-01-07 NOTE — Progress Notes (Signed)
Pt is having issue with incontinence of urine and hemorrhoids.

## 2018-01-15 ENCOUNTER — Telehealth: Payer: Self-pay | Admitting: *Deleted

## 2018-01-15 ENCOUNTER — Encounter: Payer: Self-pay | Admitting: *Deleted

## 2018-01-15 NOTE — Telephone Encounter (Signed)
Spoke with patient re: urology appt. Patient was happy I called but was not where she could write things down. I offered to send a MyChart message, patient agreed.

## 2018-01-15 NOTE — Addendum Note (Signed)
Addended by: Riccardo Dubin on: 01/15/2018 05:00 PM   Modules accepted: Orders

## 2018-02-16 ENCOUNTER — Encounter: Payer: Self-pay | Admitting: Internal Medicine

## 2018-03-03 ENCOUNTER — Inpatient Hospital Stay (HOSPITAL_COMMUNITY)
Admission: AD | Admit: 2018-03-03 | Discharge: 2018-03-04 | Disposition: A | Discharge status: Home / Self Care | Payer: Self-pay | Source: Ambulatory Visit | Attending: Family Medicine | Admitting: Family Medicine

## 2018-03-03 ENCOUNTER — Inpatient Hospital Stay (HOSPITAL_COMMUNITY): Payer: Self-pay

## 2018-03-03 ENCOUNTER — Encounter (HOSPITAL_COMMUNITY): Payer: Self-pay

## 2018-03-03 DIAGNOSIS — Z79899 Other long term (current) drug therapy: Secondary | ICD-10-CM | POA: Insufficient documentation

## 2018-03-03 DIAGNOSIS — I739 Peripheral vascular disease, unspecified: Secondary | ICD-10-CM | POA: Insufficient documentation

## 2018-03-03 DIAGNOSIS — Z87891 Personal history of nicotine dependence: Secondary | ICD-10-CM | POA: Insufficient documentation

## 2018-03-03 DIAGNOSIS — R1030 Lower abdominal pain, unspecified: Secondary | ICD-10-CM | POA: Insufficient documentation

## 2018-03-03 DIAGNOSIS — I10 Essential (primary) hypertension: Secondary | ICD-10-CM | POA: Insufficient documentation

## 2018-03-03 DIAGNOSIS — R102 Pelvic and perineal pain: Secondary | ICD-10-CM

## 2018-03-03 DIAGNOSIS — J45909 Unspecified asthma, uncomplicated: Secondary | ICD-10-CM | POA: Insufficient documentation

## 2018-03-03 DIAGNOSIS — Z90710 Acquired absence of both cervix and uterus: Secondary | ICD-10-CM | POA: Insufficient documentation

## 2018-03-03 LAB — URINALYSIS, ROUTINE W REFLEX MICROSCOPIC
Bilirubin Urine: NEGATIVE
Glucose, UA: NEGATIVE mg/dL
Hgb urine dipstick: NEGATIVE
Ketones, ur: NEGATIVE mg/dL
Leukocytes, UA: NEGATIVE
NITRITE: NEGATIVE
PROTEIN: NEGATIVE mg/dL
Specific Gravity, Urine: 1.019 (ref 1.005–1.030)
pH: 6 (ref 5.0–8.0)

## 2018-03-03 LAB — WET PREP, GENITAL
Clue Cells Wet Prep HPF POC: NONE SEEN
Sperm: NONE SEEN
Trich, Wet Prep: NONE SEEN
YEAST WET PREP: NONE SEEN

## 2018-03-03 LAB — CBC
HCT: 39.8 % (ref 36.0–46.0)
HEMOGLOBIN: 13.2 g/dL (ref 12.0–15.0)
MCH: 29.8 pg (ref 26.0–34.0)
MCHC: 33.2 g/dL (ref 30.0–36.0)
MCV: 89.8 fL (ref 78.0–100.0)
Platelets: 279 10*3/uL (ref 150–400)
RBC: 4.43 MIL/uL (ref 3.87–5.11)
RDW: 12.7 % (ref 11.5–15.5)
WBC: 8.8 10*3/uL (ref 4.0–10.5)

## 2018-03-03 MED ORDER — KETOROLAC TROMETHAMINE 60 MG/2ML IM SOLN
60.0000 mg | Freq: Once | INTRAMUSCULAR | Status: AC
  Administered 2018-03-03: 60 mg via INTRAMUSCULAR
  Filled 2018-03-03: qty 2

## 2018-03-03 MED ORDER — KETOROLAC TROMETHAMINE 30 MG/ML IJ SOLN: 60.0000 mg | Freq: Once | INTRAMUSCULAR | Status: DC

## 2018-03-03 NOTE — MAU Provider Note (Signed)
History     CSN: [id number]  Arrival date and time: 03/03/18 2132   First Provider Initiated Contact with Patient 03/03/18 2230      Chief Complaint  Patient presents with  . Abdominal Pain   37 y.o. Non-pregnant female here with bilateral LAP. Pain started 2 weeks ago and getting worse. Rates pain 7/10, worse on right side, constant, feels like a pinched nerve in her pelvis. Took Tylenol and had no relief. Pain is worse when she flexes her hips and lying flat. Also reports pain with IC. Denies urinary sx. No fevers. No VB, discharge, itching, or odor. Has TVH about 4 months ago.    Past Medical History:  Diagnosis Date  . Anxiety   . Arthritis   . Asthma    meds as needed  . Bronchitis   . Chronic back pain   . Ectopic pregnancy    requiring surgery  . Elevated liver enzymes   . GERD (gastroesophageal reflux disease)   . Gonorrhea   . Hypertension   . Irritable bowel   . Migraine headache   . Peripheral vascular disease ([place])   . Pneumonia    2017  . Preterm labor   . 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 ABDOMINAL HYSTERECTOMY    . DILITATION & CURRETTAGE/HYSTROSCOPY WITH NOVASURE ABLATION N/A 02/20/2016   Procedure: HYSTEROSCOPY WITH NOVASURE ABLATION;  Surgeon: [name], MD;  Location: [facility];  Service: Gynecology;  Laterality: N/A;  . LAPAROSCOPIC VAGINAL HYSTERECTOMY WITH SALPINGECTOMY Left 11/18/2017   Procedure: LAPAROSCOPIC ASSISTED VAGINAL HYSTERECTOMY WITH SALPINGECTOMY;  Surgeon: Emily Filbert, MD;  Location: [facility];  Service: Gynecology;  Laterality: Left;  . LAPAROSCOPY  05/30/2012   Procedure: LAPAROSCOPY OPERATIVE;  Surgeon: Osborne Oman, MD;  Location: [facility];  Service: Gynecology;  Laterality: N/A;  Operative laparoscopy right salpingectomy and removal of ectopic pregnancy  . LAPAROSCOPY FOR ECTOPIC PREGNANCY    . SALPINGECTOMY    . TUBAL LIGATION  2005  . WISDOM  TOOTH EXTRACTION      Family History  Problem Relation Age of Onset  . Asthma Brother   . Breast cancer Cousin     Social History   Tobacco Use  . Smoking status: Former Smoker    Packs/day: 0.25    Years: 20.00    Pack years: 5.00    Types: Cigarettes    Last attempt to quit: 05/04/2016    Years since quitting: 1.8  . Smokeless tobacco: Never Used  Substance Use Topics  . Alcohol use: Yes    Alcohol/week: 0.6 oz    Types: 1 Glasses of wine per week    Comment: occasional  . Drug use: No    Allergies:  Allergies  Allergen Reactions  . Latex Hives  . Raspberry Hives, Itching and Swelling  . Vioxx [Rofecoxib] Hives    Medications Prior to Admission  Medication Sig Dispense Refill Last Dose  . acetaminophen (TYLENOL) 325 MG tablet Take 650 mg by mouth every 6 (six) hours as needed.   03/03/2018 at Unknown time  . albuterol (PROVENTIL HFA;VENTOLIN HFA) 108 (90 BASE) MCG/ACT inhaler Inhale 1-2 puffs into the lungs every 6 (six) hours as needed for wheezing or shortness of breath. (Patient not taking: Reported on 01/07/2018) 1 Inhaler 0 Not Taking  . citalopram (CELEXA) 10 MG tablet Take 10 mg every evening by mouth.  2 More than a month  at Unknown time  . diclofenac (VOLTAREN) 75 MG EC tablet Take 75 mg 2 (two) times daily as needed by mouth for mild pain.   2 More than a month at Unknown time  . hydrochlorothiazide (HYDRODIURIL) 12.5 MG tablet Take 12.5 mg daily by mouth.  2 More than a month at Unknown time  . oxyCODONE-acetaminophen (PERCOCET/ROXICET) 5-325 MG tablet Take 1-2 tablets by mouth every 4 (four) hours as needed for severe pain (moderate to severe pain (when tolerating fluids)). (Patient not taking: Reported on 01/07/2018) 30 tablet 0 Not Taking  . tiZANidine (ZANAFLEX) 4 MG tablet Take 4 mg 2 (two) times daily as needed by mouth for muscle spasms.   More than a month at Unknown time    Review of Systems  Constitutional: Negative for fever.  Gastrointestinal:  Negative for constipation and diarrhea.  Genitourinary: Positive for pelvic pain. Negative for dysuria, frequency, vaginal bleeding and vaginal discharge.   Physical Exam   Blood pressure (!) 141/80, pulse 72, temperature 98.7 F (37.1 C), temperature source Oral, resp. rate 18, height 5\' 4"  (1.626 m), weight 206 lb 8 oz (93.7 kg), last menstrual period 11/14/2017.  Physical Exam  Nursing note and vitals reviewed. Constitutional: She is oriented to person, place, and time. She appears well-developed and well-nourished. No distress.  HENT:  Head: Normocephalic and atraumatic.  Neck: Normal range of motion.  Cardiovascular: Normal rate.  Respiratory: Effort normal. No respiratory distress.  GI: Soft. She exhibits no distension and no mass. There is no tenderness. There is no rebound and no guarding.  Genitourinary:  Genitourinary Comments: External: no lesions or erythema Vagina: rugated, pink, moist, thick curdy white discharge, cuff intact Uterus: absent Adnexae: no masses, no tenderness left, + tenderness right    Musculoskeletal: Normal range of motion.  Neurological: She is alert and oriented to person, place, and time.  Skin: Skin is warm and dry.  Psychiatric: She has a normal mood and affect.   Results for orders placed or performed during the hospital encounter of 03/03/18 (from the past 24 hour(s))  Urinalysis, Routine w reflex microscopic     Status: None   Collection Time: 03/03/18  9:57 PM  Result Value Ref Range   Color, Urine YELLOW YELLOW   APPearance CLEAR CLEAR   Specific Gravity, Urine 1.019 1.005 - 1.030   pH 6.0 5.0 - 8.0   Glucose, UA NEGATIVE NEGATIVE mg/dL   Hgb urine dipstick NEGATIVE NEGATIVE   Bilirubin Urine NEGATIVE NEGATIVE   Ketones, ur NEGATIVE NEGATIVE mg/dL   Protein, ur NEGATIVE NEGATIVE mg/dL   Nitrite NEGATIVE NEGATIVE   Leukocytes, UA NEGATIVE NEGATIVE  Wet prep, genital     Status: Abnormal   Collection Time: 03/03/18 10:41 PM  Result  Value Ref Range   Yeast Wet Prep HPF POC NONE SEEN NONE SEEN   Trich, Wet Prep NONE SEEN NONE SEEN   Clue Cells Wet Prep HPF POC NONE SEEN NONE SEEN   WBC, Wet Prep HPF POC FEW (A) NONE SEEN   Sperm NONE SEEN   CBC     Status: None   Collection Time: 03/03/18 10:46 PM  Result Value Ref Range   WBC 8.8 4.0 - 10.5 K/uL   RBC 4.43 3.87 - 5.11 MIL/uL   Hemoglobin 13.2 12.0 - 15.0 g/dL   HCT 39.8 36.0 - 46.0 %   MCV 89.8 78.0 - 100.0 fL   MCH 29.8 26.0 - 34.0 pg   MCHC 33.2 30.0 - 36.0 g/dL  RDW 12.7 11.5 - 15.5 %   Platelets 279 150 - 400 K/uL   US Pelvic Complete With Transvaginal  Result Date: 03/03/2018 CLINICAL DATA:  37 year old female with history of hysterectomy presents with right pelvic pain. EXAM: TRANSABDOMINAL AND TRANSVAGINAL ULTRASOUND OF PELVIS TECHNIQUE: Both transabdominal and transvaginal ultrasound examinations of the pelvis were performed. Transabdominal technique was performed for global imaging of the pelvis including uterus, ovaries, adnexal regions, and pelvic cul-de-sac. It was necessary to proceed with endovaginal exam following the transabdominal exam to visualize the ovaries. COMPARISON:  Pelvic ultrasound dated 09/14/2017 FINDINGS: Uterus Hysterectomy. Endometrium Hysterectomy. Right ovary Not visualized and obscured by bowel gas. Left ovary None visualized and obscured by bowel gas. Other findings No abnormal free fluid. IMPRESSION: Hysterectomy.  The ovaries are not visualized.  No pelvic mass. Electronically Signed   By: Anner Crete M.D.   On: 03/03/2018 23:37   MAU Course  Procedures Toradol  MDM Labs ordered and reviewed. Pain improved. No evidence of acute abdominal or pelvic process. Pain unlikely GYN, could be MSK or GI. Stable for discharge home.   Assessment and Plan   1. Lower abdominal pain   2. Pelvic pain    Discharge home Follow up with PCP if pain doesn't improve Rx Ibuprofen Return to MAU for GYN emergencies  Allergies as of  03/04/2018      Reactions   Latex Hives   Raspberry Hives, Itching, Swelling   Vioxx [rofecoxib] Hives      Medication List    STOP taking these medications   diclofenac 75 MG EC tablet Commonly known as:  VOLTAREN   oxyCODONE-acetaminophen 5-325 MG tablet Commonly known as:  PERCOCET/ROXICET     TAKE these medications   acetaminophen 325 MG tablet Commonly known as:  TYLENOL Take 650 mg by mouth every 6 (six) hours as needed.   albuterol 108 (90 Base) MCG/ACT inhaler Commonly known as:  PROVENTIL HFA;VENTOLIN HFA Inhale 1-2 puffs into the lungs every 6 (six) hours as needed for wheezing or shortness of breath.   citalopram 10 MG tablet Commonly known as:  CELEXA Take 10 mg every evening by mouth.   hydrochlorothiazide 12.5 MG tablet Commonly known as:  HYDRODIURIL Take 12.5 mg daily by mouth.   ibuprofen 600 MG tablet Commonly known as:  ADVIL,MOTRIN Take 1 tablet (600 mg total) by mouth every 6 (six) hours as needed.   tiZANidine 4 MG tablet Commonly known as:  ZANAFLEX Take 4 mg 2 (two) times daily as needed by mouth for muscle spasms.      Julianne Handler, CNM 03/04/2018, 12:23 AM

## 2018-03-03 NOTE — MAU Note (Addendum)
PT SAY HAD PARTIAL HYST ON 11-18-2017... HAD ECTOPIC IN 2014- REMOVED A TUBE    STARTED HAVING LOWER ABD PAIN- 2 WEEKS  AGO. Marland Kitchen  LAST BM- TODAY - NL.    VOIDS - FEELS INCREASE FREQ  - NO BURNING .   HAS BURNING  ON RIGHT SIDE OF ABD .    VOMITED  TODAY ONLY - AT 4 PM- ATE [facility]ZZA .   SHE ATTEMPTED   SEX  LAST NIGHT - BUT  HURT.

## 2018-03-04 LAB — GC/CHLAMYDIA PROBE AMP (~~LOC~~) NOT AT ARMC
Chlamydia: NEGATIVE
Neisseria Gonorrhea: NEGATIVE

## 2018-03-04 MED ORDER — IBUPROFEN 600 MG PO TABS: 600.0000 mg | ORAL_TABLET | Freq: Four times a day (QID) | ORAL | 0 refills | Status: DC | PRN

## 2018-03-04 NOTE — Discharge Instructions (Signed)

## 2018-03-26 ENCOUNTER — Ambulatory Visit: Payer: Self-pay | Admitting: Internal Medicine

## 2018-03-30 ENCOUNTER — Encounter: Payer: Self-pay | Admitting: Obstetrics & Gynecology

## 2018-06-08 ENCOUNTER — Encounter (HOSPITAL_COMMUNITY): Payer: Self-pay | Admitting: Emergency Medicine

## 2018-06-08 ENCOUNTER — Other Ambulatory Visit: Payer: Self-pay

## 2018-06-08 ENCOUNTER — Emergency Department (HOSPITAL_COMMUNITY)
Admission: EM | Admit: 2018-06-08 | Discharge: 2018-06-09 | Disposition: A | Discharge status: Home / Self Care | Payer: Medicaid Other | Attending: Emergency Medicine | Admitting: Emergency Medicine

## 2018-06-08 ENCOUNTER — Emergency Department (HOSPITAL_COMMUNITY): Payer: Medicaid Other

## 2018-06-08 DIAGNOSIS — J45909 Unspecified asthma, uncomplicated: Secondary | ICD-10-CM | POA: Insufficient documentation

## 2018-06-08 DIAGNOSIS — R079 Chest pain, unspecified: Secondary | ICD-10-CM | POA: Diagnosis present

## 2018-06-08 DIAGNOSIS — Z87891 Personal history of nicotine dependence: Secondary | ICD-10-CM | POA: Insufficient documentation

## 2018-06-08 DIAGNOSIS — R0789 Other chest pain: Secondary | ICD-10-CM | POA: Diagnosis not present

## 2018-06-08 DIAGNOSIS — Z79899 Other long term (current) drug therapy: Secondary | ICD-10-CM | POA: Diagnosis not present

## 2018-06-08 DIAGNOSIS — I10 Essential (primary) hypertension: Secondary | ICD-10-CM | POA: Diagnosis not present

## 2018-06-08 LAB — CBC
HCT: 39.8 % (ref 36.0–46.0)
Hemoglobin: 13 g/dL (ref 12.0–15.0)
MCH: 29.4 pg (ref 26.0–34.0)
MCHC: 32.7 g/dL (ref 30.0–36.0)
MCV: 90 fL (ref 78.0–100.0)
PLATELETS: 267 10*3/uL (ref 150–400)
RBC: 4.42 MIL/uL (ref 3.87–5.11)
RDW: 12.3 % (ref 11.5–15.5)
WBC: 10.5 10*3/uL (ref 4.0–10.5)

## 2018-06-08 LAB — BASIC METABOLIC PANEL
Anion gap: 8 (ref 5–15)
BUN: 12 mg/dL (ref 6–20)
CO2: 22 mmol/L (ref 22–32)
CREATININE: 0.85 mg/dL (ref 0.44–1.00)
Calcium: 9.1 mg/dL (ref 8.9–10.3)
Chloride: 109 mmol/L (ref 101–111)
GFR calc Af Amer: >60 mL/min (ref >60)
GFR calc non Af Amer: >60 mL/min (ref >60)
Glucose, Bld: 94 mg/dL (ref 65–99)
Potassium: 3.7 mmol/L (ref 3.5–5.1)
SODIUM: 139 mmol/L (ref 135–145)

## 2018-06-08 LAB — I-STAT BETA HCG BLOOD, ED (MC, WL, AP ONLY): I-stat hCG, quantitative: <5 m[IU]/mL (ref <5)

## 2018-06-08 LAB — I-STAT TROPONIN, ED: Troponin i, poc: 0.01 ng/mL (ref 0.00–0.08)

## 2018-06-08 LAB — D-DIMER, QUANTITATIVE: D-Dimer, Quant: 0.49 ug/mL-FEU (ref 0.00–0.50)

## 2018-06-08 MED ORDER — ONDANSETRON HCL 4 MG/2ML IJ SOLN
4.0000 mg | Freq: Once | INTRAMUSCULAR | Status: AC
  Administered 2018-06-08: 4 mg via INTRAVENOUS
  Filled 2018-06-08: qty 2

## 2018-06-08 MED ORDER — SODIUM CHLORIDE 0.9 % IV BOLUS
1000.0000 mL | Freq: Once | INTRAVENOUS | Status: AC
  Administered 2018-06-08: 1000 mL via INTRAVENOUS

## 2018-06-08 MED ORDER — MORPHINE SULFATE (PF) 4 MG/ML IV SOLN
4.0000 mg | Freq: Once | INTRAVENOUS | Status: AC
  Administered 2018-06-08: 4 mg via INTRAVENOUS
  Filled 2018-06-08: qty 1

## 2018-06-08 MED ORDER — LORAZEPAM 1 MG PO TABS
1.0000 mg | ORAL_TABLET | Freq: Once | ORAL | Status: AC
  Administered 2018-06-08: 1 mg via ORAL
  Filled 2018-06-08: qty 1

## 2018-06-08 NOTE — ED Provider Notes (Signed)
Patient placed in Quick Look pathway, seen and evaluated   Chief Complaint: chest pain   HPI:   Pt thinks she maybe having an anxiety attack.  Pt complains of cest pain  ROS: shortness of breath  Physical Exam:   Gen: No distress  Neuro: Awake and Alert  Skin: Warm    Focused Exam: Lungs clear, Heart rrr   Initiation of care has begun. The patient has been counseled on the process, plan, and necessity for staying for the completion/evaluation, and the remainder of the medical screening examination   Sidney Ace 06/08/18 2029    Valarie Merino, MD 06/08/18 [phone]

## 2018-06-08 NOTE — ED Provider Notes (Signed)
Home EMERGENCY DEPARTMENT Provider Note   CSN: [id number] Arrival date & time: 06/08/18  2019     History   Chief Complaint Chief Complaint  Patient presents with  . Chest Pain    HPI [name] is a 37 y.o. female.  Patient presents to the emergency department for evaluation of chest pain.  Patient reports that symptoms began earlier today.  Patient experiencing right-sided chest pain which is sharp and stabbing.  She reports that it starts in her neck just behind her ear and then shoots down to her shoulder and chest area.  Pain comes and goes like spasms.  She is not short of breath.  No nausea, diaphoresis.  Patient reports that she just noticed that she has itchy bumps on her hands as well.  No one else in the house or close to her has a similar rash.  She has not had similar rashes before.     Past Medical History:  Diagnosis Date  . Anxiety   . Arthritis   . Asthma    meds as needed  . Bronchitis   . Chronic back pain   . Ectopic pregnancy    requiring surgery  . Elevated liver enzymes   . GERD (gastroesophageal reflux disease)   . Gonorrhea   . Hypertension   . Irritable bowel   . Migraine headache   . Peripheral vascular disease ([place])   . Pneumonia    2017  . Preterm labor   . Seizures ([place])    fever induced during childhood    Patient Active Problem List   Diagnosis Date Noted  . Post-operative state 11/18/2017  . Fibroid uterus 11/02/2015  . Abnormal uterine bleeding (AUB) 11/02/2015  . Asthma exacerbation 03/23/2015  . Tobacco abuse 03/23/2015  . History of migraine 03/23/2015  . Acute asthma exacerbation   . Cough   . Acute bronchitis   . Asthma 03/22/2015  . Migraine, unspecified, without mention of intractable migraine without mention of status migrainosus 03/07/2014    Past Surgical History:  Procedure Laterality Date  . ABDOMINAL HYSTERECTOMY    . DILITATION & CURRETTAGE/HYSTROSCOPY WITH NOVASURE  ABLATION N/A 02/20/2016   Procedure: HYSTEROSCOPY WITH NOVASURE ABLATION;  Surgeon: [name], MD;  Location: [facility];  Service: Gynecology;  Laterality: N/A;  . LAPAROSCOPIC VAGINAL HYSTERECTOMY WITH SALPINGECTOMY Left 11/18/2017   Procedure: LAPAROSCOPIC ASSISTED VAGINAL HYSTERECTOMY WITH SALPINGECTOMY;  Surgeon: Emily Filbert, MD;  Location: [facility];  Service: Gynecology;  Laterality: Left;  . LAPAROSCOPY  05/30/2012   Procedure: LAPAROSCOPY OPERATIVE;  Surgeon: Osborne Oman, MD;  Location: [facility];  Service: Gynecology;  Laterality: N/A;  Operative laparoscopy right salpingectomy and removal of ectopic pregnancy  . LAPAROSCOPY FOR ECTOPIC PREGNANCY    . SALPINGECTOMY    . TUBAL LIGATION  2005  . WISDOM TOOTH EXTRACTION       OB History    Gravida  5   Para  3   Term  2   Preterm  1   AB  2   Living  3     SAB  1   TAB      Ectopic  1   Multiple      Live Births               Home Medications    Prior to Admission medications   Medication Sig Start Date End Date Taking? Authorizing Provider  hydrochlorothiazide (HYDRODIURIL) 12.5 MG tablet Take 12.5  mg daily by mouth. 09/18/17  Yes [provider]  traMADol (ULTRAM) 50 MG tablet Take 1 tablet (50 mg total) by mouth every 6 (six) hours as needed. 06/09/18   Orpah Greek, MD    Family History Family History  Problem Relation Age of Onset  . Asthma Brother   . Breast cancer Cousin     Social History Social History   Tobacco Use  . Smoking status: Former Smoker    Packs/day: 0.25    Years: 20.00    Pack years: 5.00    Types: Cigarettes    Last attempt to quit: 05/04/2016    Years since quitting: 2.0  . Smokeless tobacco: Never Used  Substance Use Topics  . Alcohol use: Yes    Alcohol/week: 0.6 oz    Types: 1 Glasses of wine per week    Comment: occasional  . Drug use: No     Allergies   Latex; Raspberry; and Vioxx [rofecoxib]   Review of Systems Review of  Systems  Cardiovascular: Positive for chest pain.  Skin: Positive for rash.  All other systems reviewed and are negative.    Physical Exam Updated Vital Signs BP 131/79 (BP Location: Right Arm)   Pulse 68   Temp 98.6 F (37 C) (Oral)   Resp 18   Ht 5\' 4"  (1.626 m)   LMP 11/14/2017   SpO2 99%   BMI 35.45 kg/m   Physical Exam  Constitutional: She is oriented to person, place, and time. She appears well-developed and well-nourished. No distress.  HENT:  Head: Normocephalic and atraumatic.  Right Ear: Hearing normal.  Left Ear: Hearing normal.  Nose: Nose normal.  Mouth/Throat: Oropharynx is clear and moist and mucous membranes are normal.  Eyes: Pupils are equal, round, and reactive to light. Conjunctivae and EOM are normal.  Neck: Neck supple. Muscular tenderness present. Decreased range of motion present.    Cardiovascular: Regular rhythm, S1 normal and S2 normal. Exam reveals no gallop and no friction rub.  No murmur heard. Pulmonary/Chest: Effort normal and breath sounds normal. No respiratory distress. She exhibits tenderness.    Abdominal: Soft. Normal appearance and bowel sounds are normal. There is no hepatosplenomegaly. There is no tenderness. There is no rebound, no guarding, no tenderness at McBurney's point and negative Murphy's sign. No hernia.  Neurological: She is alert and oriented to person, place, and time. She has normal strength. No cranial nerve deficit or sensory deficit. Coordination normal. GCS eye subscore is 4. GCS verbal subscore is 5. GCS motor subscore is 6.  Skin: Skin is warm, dry and intact. No rash noted. No cyanosis.  Psychiatric: She has a normal mood and affect. Her speech is normal and behavior is normal. Thought content normal.  Nursing note and vitals reviewed.    ED Treatments / Results  Labs (all labs ordered are listed, but only abnormal results are displayed) Labs Reviewed  BASIC METABOLIC PANEL  CBC  D-DIMER, QUANTITATIVE  (NOT AT [facility])  I-STAT TROPONIN, ED  I-STAT BETA HCG BLOOD, ED ([facility], [facility], [facility] ONLY)  I-STAT TROPONIN, ED    EKG EKG Interpretation  Date/Time:  Monday June 08 2018 20:23:31 EDT Ventricular Rate:  91 PR Interval:  110 QRS Duration: 68 QT Interval:  358 QTC Calculation: 440 R Axis:   71 Text Interpretation:  Sinus rhythm with short PR Otherwise normal ECG Confirmed by Orpah Greek [id number] on 06/08/2018 11:03:10 PM   Radiology Dg Chest 2 View  Result Date:  06/08/2018 CLINICAL DATA:  Chest pain starting on Saturday. Hot flashes. Right-sided pain. EXAM: CHEST - 2 VIEW COMPARISON:  12/27/2015 FINDINGS: Normal heart size and pulmonary vascularity. No focal airspace disease or consolidation in the lungs. No blunting of costophrenic angles. No pneumothorax. Mediastinal contours appear intact. IMPRESSION: No active cardiopulmonary disease. Electronically Signed   By: Lucienne Capers M.D.   On: 06/08/2018 21:04    Procedures Procedures (including critical care time)  Medications Ordered in ED Medications  LORazepam (ATIVAN) tablet 1 mg (1 mg Oral Given 06/08/18 2042)  sodium chloride 0.9 % bolus 1,000 mL (0 mLs Intravenous Stopped 06/09/18 0046)  morphine 4 MG/ML injection 4 mg (4 mg Intravenous Given 06/08/18 2345)  ondansetron (ZOFRAN) injection 4 mg (4 mg Intravenous Given 06/08/18 2345)     Initial Impression / Assessment and Plan / ED Course  I have reviewed the triage vital signs and the nursing notes.  Pertinent labs & imaging results that were available during my care of the patient were reviewed by me and considered in my medical decision making (see chart for details).     Patient presents to the emergency department with complaints of chest pain.  Pain is atypical in that it is sharp, stabbing, related to movement of her neck and torso.  Pain is located on the right side of her chest wall.  It is reproducible by palpation.  Patient has a normal EKG, troponin negative x2.   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  Her d-dimer, however, is negative.  She is under 50 and has no tachycardia or hypoxia.  PERC negative, d-dimer negative.  She reassured, treat for musculoskeletal chest pain.  Final Clinical Impressions(s) / ED Diagnoses   Final diagnoses:  Chest wall pain    ED Discharge Orders        Ordered    traMADol (ULTRAM) 50 MG tablet  Every 6 hours PRN     06/09/18 0150       Orpah Greek, MD 06/09/18 0151

## 2018-06-08 NOTE — ED Triage Notes (Signed)
Patient with chest pain that started on Saturday, she states that the pain comes and goes.  She states that she gets hot flashes with it.  She states that it is in the right side.  She states that she usually takes an anxiety pill and it went away, but it came back worse this evening.  She denies any shortness of breath or nausea or vomiting.

## 2018-06-09 LAB — I-STAT TROPONIN, ED: TROPONIN I, POC: 0 ng/mL (ref 0.00–0.08)

## 2018-06-09 MED ORDER — TRAMADOL HCL 50 MG PO TABS: 50.0000 mg | ORAL_TABLET | Freq: Four times a day (QID) | ORAL | 0 refills | Status: DC | PRN

## 2018-06-11 ENCOUNTER — Emergency Department (HOSPITAL_COMMUNITY)
Admission: EM | Admit: 2018-06-11 | Discharge: 2018-06-11 | Disposition: A | Discharge status: Home / Self Care | Payer: Medicaid Other | Attending: Emergency Medicine | Admitting: Emergency Medicine

## 2018-06-11 ENCOUNTER — Encounter (HOSPITAL_COMMUNITY): Payer: Self-pay | Admitting: Emergency Medicine

## 2018-06-11 ENCOUNTER — Other Ambulatory Visit: Payer: Self-pay

## 2018-06-11 ENCOUNTER — Emergency Department (HOSPITAL_COMMUNITY): Payer: Medicaid Other

## 2018-06-11 DIAGNOSIS — Z87891 Personal history of nicotine dependence: Secondary | ICD-10-CM | POA: Insufficient documentation

## 2018-06-11 DIAGNOSIS — Z79899 Other long term (current) drug therapy: Secondary | ICD-10-CM | POA: Insufficient documentation

## 2018-06-11 DIAGNOSIS — R079 Chest pain, unspecified: Secondary | ICD-10-CM | POA: Diagnosis present

## 2018-06-11 DIAGNOSIS — M940 Chondrocostal junction syndrome [Tietze]: Secondary | ICD-10-CM | POA: Diagnosis not present

## 2018-06-11 DIAGNOSIS — J45909 Unspecified asthma, uncomplicated: Secondary | ICD-10-CM | POA: Diagnosis not present

## 2018-06-11 DIAGNOSIS — I10 Essential (primary) hypertension: Secondary | ICD-10-CM | POA: Insufficient documentation

## 2018-06-11 LAB — CBC
HCT: 37.4 % (ref 36.0–46.0)
Hemoglobin: 12.4 g/dL (ref 12.0–15.0)
MCH: 30.1 pg (ref 26.0–34.0)
MCHC: 33.2 g/dL (ref 30.0–36.0)
MCV: 90.8 fL (ref 78.0–100.0)
Platelets: 257 10*3/uL (ref 150–400)
RBC: 4.12 MIL/uL (ref 3.87–5.11)
RDW: 12.3 % (ref 11.5–15.5)
WBC: 9.3 10*3/uL (ref 4.0–10.5)

## 2018-06-11 LAB — BASIC METABOLIC PANEL
Anion gap: 7 (ref 5–15)
BUN: 6 mg/dL (ref 6–20)
CHLORIDE: 109 mmol/L (ref 101–111)
CO2: 24 mmol/L (ref 22–32)
Calcium: 8.8 mg/dL — ABNORMAL LOW (ref 8.9–10.3)
Creatinine, Ser: 0.84 mg/dL (ref 0.44–1.00)
GFR calc Af Amer: >60 mL/min (ref >60)
GFR calc non Af Amer: >60 mL/min (ref >60)
GLUCOSE: 123 mg/dL — AB (ref 65–99)
Potassium: 3.5 mmol/L (ref 3.5–5.1)
SODIUM: 140 mmol/L (ref 135–145)

## 2018-06-11 LAB — I-STAT BETA HCG BLOOD, ED (MC, WL, AP ONLY)

## 2018-06-11 LAB — I-STAT TROPONIN, ED: Troponin i, poc: 0 ng/mL (ref 0.00–0.08)

## 2018-06-11 MED ORDER — CYCLOBENZAPRINE HCL 10 MG PO TABS
10.0000 mg | ORAL_TABLET | Freq: Once | ORAL | Status: AC
  Administered 2018-06-11: 10 mg via ORAL
  Filled 2018-06-11: qty 1

## 2018-06-11 MED ORDER — IOPAMIDOL (ISOVUE-370) INJECTION 76%
100.0000 mL | Freq: Once | INTRAVENOUS | Status: AC | PRN
  Administered 2018-06-11: 100 mL via INTRAVENOUS

## 2018-06-11 MED ORDER — CYCLOBENZAPRINE HCL 10 MG PO TABS: 10.0000 mg | ORAL_TABLET | Freq: Two times a day (BID) | ORAL | 0 refills | Status: DC | PRN

## 2018-06-11 MED ORDER — IOPAMIDOL (ISOVUE-370) INJECTION 76%
INTRAVENOUS | Status: AC
  Filled 2018-06-11: qty 100

## 2018-06-11 MED ORDER — IBUPROFEN 800 MG PO TABS: 800.0000 mg | ORAL_TABLET | Freq: Three times a day (TID) | ORAL | 0 refills | Status: DC

## 2018-06-11 MED ORDER — OXYCODONE-ACETAMINOPHEN 5-325 MG PO TABS
1.0000 | ORAL_TABLET | Freq: Once | ORAL | Status: AC
  Administered 2018-06-11: 1 via ORAL
  Filled 2018-06-11: qty 1

## 2018-06-11 MED ORDER — DEXAMETHASONE 4 MG PO TABS
10.0000 mg | ORAL_TABLET | Freq: Once | ORAL | Status: AC
  Administered 2018-06-11: 10 mg via ORAL
  Filled 2018-06-11: qty 3

## 2018-06-11 NOTE — ED Provider Notes (Signed)
MSE was initiated and I personally evaluated the patient and placed orders (if any) at  3:40 PM on June 11, 2018.  The patient appears stable so that the remainder of the MSE may be completed by another provider.  Patient placed in Quick Look pathway, seen and evaluated   Chief Complaint: CP, neck pain, back pain, arms numb  HPI:   Evaluated for same to days ago, worsening, given tramadol, told it was stress.   ROS: cp (one)  Physical Exam:   Gen: No distress  Neuro: Awake and Alert  Skin: Warm    Focused Exam: RRR   Initiation of care has begun. The patient has been counseled on the process, plan, and necessity for staying for the completion/evaluation, and the remainder of the medical screening examination    Margarita Mail, Hershal Coria 06/11/18 1541    Dorie Rank, MD 06/14/18 2023

## 2018-06-11 NOTE — ED Provider Notes (Signed)
[facility] EMERGENCY DEPARTMENT Provider Note   CSN: [id number] Arrival date & time: 06/11/18  1533     History   Chief Complaint Chief Complaint  Patient presents with  . Chest Pain    HPI [name] is a 37 y.o. female.  The history is provided by the patient. No language interpreter was used.  Chest Pain      [name] is a 37 y.o. female who presents to the Emergency Department complaining of chest pain. She reports one week of generalized chest pain, right greater than left. Pain is constant in nature and worse with deep breathing. She has associated shortness of breath. Her pain radiates to her right chest, shoulder and back. She denies any fevers, nausea, vomiting, leg swelling or pain. She was seen in the emergency department a few days ago for similar symptoms and was placed on tramadol. She states that she has no change in her pain with the medication. She has a history of hypertension. She also endorses an itchy rash to her hands and feet that have been there for about the last week.  Past Medical History:  Diagnosis Date  . Anxiety   . Arthritis   . Asthma    meds as needed  . Bronchitis   . Chronic back pain   . Ectopic pregnancy    requiring surgery  . Elevated liver enzymes   . GERD (gastroesophageal reflux disease)   . Gonorrhea   . Hypertension   . Irritable bowel   . Migraine headache   . Peripheral vascular disease ([place])   . Pneumonia    2017  . Preterm labor   . Seizures ([place])    fever induced during childhood    Patient Active Problem List   Diagnosis Date Noted  . Post-operative state 11/18/2017  . Fibroid uterus 11/02/2015  . Abnormal uterine bleeding (AUB) 11/02/2015  . Asthma exacerbation 03/23/2015  . Tobacco abuse 03/23/2015  . History of migraine 03/23/2015  . Acute asthma exacerbation   . Cough   . Acute bronchitis   . Asthma 03/22/2015  . Migraine, unspecified, without mention of  intractable migraine without mention of status migrainosus 03/07/2014    Past Surgical History:  Procedure Laterality Date  . ABDOMINAL HYSTERECTOMY    . DILITATION & CURRETTAGE/HYSTROSCOPY WITH NOVASURE ABLATION N/A 02/20/2016   Procedure: HYSTEROSCOPY WITH NOVASURE ABLATION;  Surgeon: [name], MD;  Location: [facility];  Service: Gynecology;  Laterality: N/A;  . LAPAROSCOPIC VAGINAL HYSTERECTOMY WITH SALPINGECTOMY Left 11/18/2017   Procedure: LAPAROSCOPIC ASSISTED VAGINAL HYSTERECTOMY WITH SALPINGECTOMY;  Surgeon: Emily Filbert, MD;  Location: [facility];  Service: Gynecology;  Laterality: Left;  . LAPAROSCOPY  05/30/2012   Procedure: LAPAROSCOPY OPERATIVE;  Surgeon: Osborne Oman, MD;  Location: [facility];  Service: Gynecology;  Laterality: N/A;  Operative laparoscopy right salpingectomy and removal of ectopic pregnancy  . LAPAROSCOPY FOR ECTOPIC PREGNANCY    . SALPINGECTOMY    . TUBAL LIGATION  2005  . WISDOM TOOTH EXTRACTION       OB History    Gravida  5   Para  3   Term  2   Preterm  1   AB  2   Living  3     SAB  1   TAB      Ectopic  1   Multiple      Live Births  Home Medications    Prior to Admission medications   Medication Sig Start Date End Date Taking? Authorizing Provider  cyclobenzaprine (FLEXERIL) 10 MG tablet Take 1 tablet (10 mg total) by mouth 2 (two) times daily as needed for muscle spasms. 06/11/18   Quintella Reichert, MD  hydrochlorothiazide (HYDRODIURIL) 12.5 MG tablet Take 12.5 mg daily by mouth. 09/18/17   [provider]  ibuprofen (ADVIL,MOTRIN) 800 MG tablet Take 1 tablet (800 mg total) by mouth 3 (three) times daily. 06/11/18   Quintella Reichert, MD  traMADol (ULTRAM) 50 MG tablet Take 1 tablet (50 mg total) by mouth every 6 (six) hours as needed. 06/09/18   Orpah Greek, MD    Family History Family History  Problem Relation Age of Onset  . Asthma Brother   . Breast cancer Cousin     Social  History Social History   Tobacco Use  . Smoking status: Former Smoker    Packs/day: 0.25    Years: 20.00    Pack years: 5.00    Types: Cigarettes    Last attempt to quit: 05/04/2016    Years since quitting: 2.1  . Smokeless tobacco: Never Used  Substance Use Topics  . Alcohol use: Yes    Alcohol/week: 0.6 oz    Types: 1 Glasses of wine per week    Comment: occasional  . Drug use: No     Allergies   Latex; Raspberry; and Vioxx [rofecoxib]   Review of Systems Review of Systems  Cardiovascular: Positive for chest pain.  All other systems reviewed and are negative.    Physical Exam Updated Vital Signs BP (!) 146/88   Pulse 72   Temp 98.4 F (36.9 C) (Oral)   Resp 19   LMP 11/14/2017   SpO2 100%   Physical Exam  Constitutional: She is oriented to person, place, and time. She appears well-developed and well-nourished.  HENT:  Head: Normocephalic and atraumatic.  Cardiovascular: Normal rate and regular rhythm.  No murmur heard. Pulmonary/Chest: Effort normal and breath sounds normal. No respiratory distress.  Tender to palpation over the right upper chest wall  Abdominal: Soft. There is no tenderness. There is no rebound and no guarding.  Musculoskeletal: She exhibits no edema or tenderness.  Neurological: She is alert and oriented to person, place, and time.  Skin: Skin is warm and dry.  Scattered papules to the hands and feet on the [place] and dorsal surfaces.  Psychiatric: She has a normal mood and affect. Her behavior is normal.  Nursing note and vitals reviewed.    ED Treatments / Results  Labs (all labs ordered are listed, but only abnormal results are displayed) Labs Reviewed  BASIC METABOLIC PANEL - Abnormal; Notable for the following components:      Result Value   Glucose, Bld 123 (*)    Calcium 8.8 (*)    All other components within normal limits  CBC  I-STAT TROPONIN, ED  I-STAT BETA HCG BLOOD, ED ([facility], [facility], [facility] ONLY)    EKG EKG  Interpretation  Date/Time:  Thursday June 11 2018 15:39:22 EDT Ventricular Rate:  92 PR Interval:  124 QRS Duration: 78 QT Interval:  348 QTC Calculation: 430 R Axis:   66 Text Interpretation:  Normal sinus rhythm Normal ECG Confirmed by Quintella Reichert [phone] on 06/11/2018 5:43:09 PM   Radiology Dg Chest 2 View  Result Date: 06/11/2018 CLINICAL DATA:  Chest pain. EXAM: CHEST - 2 VIEW COMPARISON:  Chest x-ray dated June 08, 2018. FINDINGS:  The heart size and mediastinal contours are within normal limits. Both lungs are clear. The visualized skeletal structures are unremarkable. IMPRESSION: No active cardiopulmonary disease. Electronically Signed   By: Titus Dubin M.D.   On: 06/11/2018 16:51   Ct Angio Chest Pe W/cm &/or Wo Cm  Result Date: 06/11/2018 CLINICAL DATA:  Chest pain, rule out pulmonary embolism. EXAM: CT ANGIOGRAPHY CHEST WITH CONTRAST TECHNIQUE: Multidetector CT imaging of the chest was performed using the standard protocol during bolus administration of intravenous contrast. Multiplanar CT image reconstructions and MIPs were obtained to evaluate the vascular anatomy. CONTRAST:  176mL ISOVUE-370 IOPAMIDOL (ISOVUE-370) INJECTION 76% COMPARISON:  Chest two-view 06/11/2018 FINDINGS: Cardiovascular: Negative for pulmonary embolism. Normal aortic arch. Heart size normal. Mediastinum/Nodes: Negative for mass or adenopathy Lungs/Pleura: Lungs are clear.  No infiltrate effusion or mass. Upper Abdomen: Negative Musculoskeletal: Negative Review of the MIP images confirms the above findings. IMPRESSION: Negative CTA chest. Electronically Signed   By: Franchot Gallo M.D.   On: 06/11/2018 20:02    Procedures Procedures (including critical care time)  Medications Ordered in ED Medications  iopamidol (ISOVUE-370) 76 % injection (has no administration in time range)  cyclobenzaprine (FLEXERIL) tablet 10 mg (10 mg Oral Given 06/11/18 2035)  iopamidol (ISOVUE-370) 76 % injection 100 mL (100  mLs Intravenous Contrast Given 06/11/18 1932)  dexamethasone (DECADRON) tablet 10 mg (10 mg Oral Given 06/11/18 2132)  oxyCODONE-acetaminophen (PERCOCET/ROXICET) 5-325 MG per tablet 1 tablet (1 tablet Oral Given 06/11/18 2132)     Initial Impression / Assessment and Plan / ED Course  I have reviewed the triage vital signs and the nursing notes.  Pertinent labs & imaging results that were available during my care of the patient were reviewed by me and considered in my medical decision making (see chart for details).     Pt here for evaluation of worsening chest pain over the last few days with progressive shortness of breath. Her pain is reproducible on evaluation. Visit to ED from two days ago reviewed. Given significant dyspnea and worsening symptoms CTA obtained to rule out PE. CT is negative for PE. Current presentation is not consistent with ACS, dissection, pneumonia, pericarditis. Discussed with patient home care for musculoskeletal chest pain/costochondritis. Discussed NSAIDs, Tylenol. Will prescribed muscle relaxant and give one-time dose of steroids for symptom control. Discussed outpatient follow-up and return precautions.  Final Clinical Impressions(s) / ED Diagnoses   Final diagnoses:  Costochondritis    ED Discharge Orders        Ordered    ibuprofen (ADVIL,MOTRIN) 800 MG tablet  3 times daily     06/11/18 2108    cyclobenzaprine (FLEXERIL) 10 MG tablet  2 times daily PRN     06/11/18 2108       Quintella Reichert, MD 06/12/18 [id number]

## 2018-06-11 NOTE — ED Triage Notes (Signed)
Pt presents with chest pain that started on Saturday, pt seen two days ago for same, states pain has started in her neck, radiates to her back and she is now feeling short of breath when pain is most severs. Reports that she has been taking prescribed tramadol with only mild relief. Hx anxiety, denies cardiac hx.

## 2018-07-05 ENCOUNTER — Emergency Department (HOSPITAL_COMMUNITY)
Admission: EM | Admit: 2018-07-05 | Discharge: 2018-07-05 | Disposition: A | Discharge status: Home / Self Care | Payer: Medicaid Other | Attending: Emergency Medicine | Admitting: Emergency Medicine

## 2018-07-05 ENCOUNTER — Emergency Department (HOSPITAL_COMMUNITY): Payer: Medicaid Other

## 2018-07-05 ENCOUNTER — Other Ambulatory Visit: Payer: Self-pay

## 2018-07-05 DIAGNOSIS — Z79899 Other long term (current) drug therapy: Secondary | ICD-10-CM | POA: Diagnosis not present

## 2018-07-05 DIAGNOSIS — I10 Essential (primary) hypertension: Secondary | ICD-10-CM | POA: Insufficient documentation

## 2018-07-05 DIAGNOSIS — R0789 Other chest pain: Secondary | ICD-10-CM | POA: Insufficient documentation

## 2018-07-05 DIAGNOSIS — Z91040 Latex allergy status: Secondary | ICD-10-CM | POA: Diagnosis not present

## 2018-07-05 DIAGNOSIS — J45909 Unspecified asthma, uncomplicated: Secondary | ICD-10-CM | POA: Insufficient documentation

## 2018-07-05 DIAGNOSIS — Z87891 Personal history of nicotine dependence: Secondary | ICD-10-CM | POA: Diagnosis not present

## 2018-07-05 LAB — BASIC METABOLIC PANEL
Anion gap: 6 (ref 5–15)
BUN: 9 mg/dL (ref 6–20)
CO2: 24 mmol/L (ref 22–32)
Calcium: 8.7 mg/dL — ABNORMAL LOW (ref 8.9–10.3)
Chloride: 106 mmol/L (ref 98–111)
Creatinine, Ser: 0.57 mg/dL (ref 0.44–1.00)
Glucose, Bld: 111 mg/dL — ABNORMAL HIGH (ref 70–99)
POTASSIUM: 4 mmol/L (ref 3.5–5.1)
SODIUM: 136 mmol/L (ref 135–145)

## 2018-07-05 LAB — I-STAT TROPONIN, ED: Troponin i, poc: 0 ng/mL (ref 0.00–0.08)

## 2018-07-05 LAB — CBC
HCT: 42.4 % (ref 36.0–46.0)
Hemoglobin: 13.8 g/dL (ref 12.0–15.0)
MCH: 30.1 pg (ref 26.0–34.0)
MCHC: 32.5 g/dL (ref 30.0–36.0)
MCV: 92.4 fL (ref 78.0–100.0)
Platelets: 282 10*3/uL (ref 150–400)
RBC: 4.59 MIL/uL (ref 3.87–5.11)
RDW: 12.4 % (ref 11.5–15.5)
WBC: 10.1 10*3/uL (ref 4.0–10.5)

## 2018-07-05 MED ORDER — HYDROCODONE-ACETAMINOPHEN 7.5-325 MG/15ML PO SOLN: 15.0000 mL | Freq: Four times a day (QID) | ORAL | 0 refills | Status: DC | PRN

## 2018-07-05 MED ORDER — OMEPRAZOLE 20 MG PO CPDR: 20.0000 mg | DELAYED_RELEASE_CAPSULE | Freq: Every day | ORAL | 0 refills | Status: DC

## 2018-07-05 MED ORDER — GI COCKTAIL ~~LOC~~
30.0000 mL | Freq: Once | ORAL | Status: AC
  Administered 2018-07-05: 30 mL via ORAL
  Filled 2018-07-05: qty 30

## 2018-07-05 MED ORDER — SUCRALFATE 1 GM/10ML PO SUSP: 1.0000 g | Freq: Three times a day (TID) | ORAL | 0 refills | Status: DC

## 2018-07-05 MED ORDER — HYDROCODONE-ACETAMINOPHEN 7.5-325 MG/15ML PO SOLN
10.0000 mL | Freq: Once | ORAL | Status: AC
  Administered 2018-07-05: 10 mL via ORAL
  Filled 2018-07-05: qty 15

## 2018-07-05 NOTE — ED Triage Notes (Addendum)
Patient c/o CP x1 month. Also c/o SOB and a rash on feet and hands.

## 2018-07-05 NOTE — ED Notes (Signed)
Patient given discharge instructions and verbalized understanding.  Patient stable to discharge at this time.  Patient is alert and oriented to baseline.  No distressed noted at this time.  All belongings taken with the patient at discharge.   

## 2018-07-05 NOTE — Discharge Instructions (Signed)
You were seen in the emergency department for chest pain, neck pain and pain that moved to your right arm.  You have been to the ER twice and they have done cardiac work-up.  This has been normal.  Work-up today was reassuring.  The cause of your symptoms is unclear although it could be a mix of acid reflux or possible neck related muscular or disc injury.  Take Hycet syrup which will help with pain.  Carafate and omeprazole will help with symptoms associated with acid reflux or gastritis.  Follow-up with your primary care doctor as scheduled this week.  Follow-up with gastroenterology for further evaluation.  Return to the ER for chest pain or shortness of breath with exertion or activity, fevers, cough, rash, vomiting, abdominal pain

## 2018-07-05 NOTE — ED Provider Notes (Signed)
[facility] EMERGENCY DEPARTMENT Provider Note   CSN: [id number] Arrival date & time: 07/05/18  8588     History   Chief Complaint Chief Complaint  Patient presents with  . Chest Pain    HPI [name] is a 37 y.o. female with h/o HTN here for evaluation of pain in chest x 1 month. Initially intermittent but now constant, more severe. Described as a toothache sensation. Starts at epigastrium and radiates up into central chest, bilateral sides of neck, bilateral shoulders R>L. Tender to touch. Worse with turning head to the sides, swallowing, talking, eating, sneezing, coughing, moving forwards, moving arms up/down, clearing her throat. Feels like her voice is Presenter, broadcasting. Feels like when she swallows her spit gets stuck and it makes the pain worse.  When pain worsens it takes her breath away.  Has also noticed increased burping, indigestion after meals, nausea.  Has been to ER twice for this and had normal work up, has finished steroids, ibuprofen, tramadol, cold compresses without relief.  Missed appt with PCP on 7/2, has appointment on 7/18.  Now taking more ibuprofen for pain, but before symptoms started only used prn.  Occasional ETOH use. No sore throat, cough, fevers, chills, vomiting, hematemesis. Known c-spine "bugling disc and pinched nerve".  Additionally, has had rash to hands, feet x 1 month, described a white/yellow bumps that are itchy and drain clear fluid.  Two family members had similar rash in hands.  Bumps go away on their own but return. Has been using benadryl with relief.   HPI  Past Medical History:  Diagnosis Date  . Anxiety   . Arthritis   . Asthma    meds as needed  . Bronchitis   . Chronic back pain   . Ectopic pregnancy    requiring surgery  . Elevated liver enzymes   . GERD (gastroesophageal reflux disease)   . Gonorrhea   . Hypertension   . Irritable bowel   . Migraine headache   . Peripheral vascular  disease ([place])   . Pneumonia    2017  . Preterm labor   . 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 Post-operative state 11/18/2017  . Fibroid uterus 11/02/2015  . Abnormal uterine bleeding (AUB) 11/02/2015  . Asthma exacerbation 03/23/2015  . Tobacco abuse 03/23/2015  . History of migraine 03/23/2015  . Acute asthma exacerbation   . Cough   . Acute bronchitis   . Asthma 03/22/2015  . Migraine, unspecified, without mention of intractable migraine without mention of status migrainosus 03/07/2014    Past Surgical History:  Procedure Laterality Date  . ABDOMINAL HYSTERECTOMY    . DILITATION & CURRETTAGE/HYSTROSCOPY WITH NOVASURE ABLATION N/A 02/20/2016   Procedure: HYSTEROSCOPY WITH NOVASURE ABLATION;  Surgeon: [name], MD;  Location: [facility];  Service: Gynecology;  Laterality: N/A;  . LAPAROSCOPIC VAGINAL HYSTERECTOMY WITH SALPINGECTOMY Left 11/18/2017   Procedure: LAPAROSCOPIC ASSISTED VAGINAL HYSTERECTOMY WITH SALPINGECTOMY;  Surgeon: Emily Filbert, MD;  Location: [facility];  Service: Gynecology;  Laterality: Left;  . LAPAROSCOPY  05/30/2012   Procedure: LAPAROSCOPY OPERATIVE;  Surgeon: Osborne Oman, MD;  Location: [facility];  Service: Gynecology;  Laterality: N/A;  Operative laparoscopy right salpingectomy and removal of ectopic pregnancy  . LAPAROSCOPY FOR ECTOPIC PREGNANCY    . SALPINGECTOMY    . TUBAL LIGATION  2005  . WISDOM TOOTH EXTRACTION  OB History    Gravida  5   Para  3   Term  2   Preterm  1   AB  2   Living  3     SAB  1   TAB      Ectopic  1   Multiple      Live Births               Home Medications    Prior to Admission medications   Medication Sig Start Date End Date Taking? Authorizing Provider  acetaminophen (TYLENOL) 650 MG CR tablet Take 1,300 mg by mouth every 8 (eight) hours as needed for pain.   Yes [provider]  citalopram (CELEXA)  10 MG tablet Take 10 mg by mouth every evening. 06/16/18  Yes [provider]  diphenhydrAMINE (BENADRYL) 25 MG tablet Take 25 mg by mouth as needed for itching or allergies.   Yes [provider]  hydrochlorothiazide (HYDRODIURIL) 12.5 MG tablet Take 12.5 mg daily by mouth. 09/18/17  Yes [provider]  cyclobenzaprine (FLEXERIL) 10 MG tablet Take 1 tablet (10 mg total) by mouth 2 (two) times daily as needed for muscle spasms. Patient not taking: Reported on 07/05/2018 06/11/18   Quintella Reichert, MD  HYDROcodone-acetaminophen (HYCET) 7.5-325 mg/15 ml solution Take 15 mLs by mouth 4 (four) times daily as needed for moderate pain. 07/05/18 07/05/19  Kinnie Feil, PA-C  ibuprofen (ADVIL,MOTRIN) 800 MG tablet Take 1 tablet (800 mg total) by mouth 3 (three) times daily. Patient not taking: Reported on 07/05/2018 06/11/18   Quintella Reichert, MD  omeprazole (PRILOSEC) 20 MG capsule Take 1 capsule (20 mg total) by mouth daily. 07/05/18 08/04/18  Kinnie Feil, PA-C  sucralfate (CARAFATE) 1 GM/10ML suspension Take 10 mLs (1 g total) by mouth 4 (four) times daily -  with meals and at bedtime. 07/05/18   Kinnie Feil, PA-C  traMADol (ULTRAM) 50 MG tablet Take 1 tablet (50 mg total) by mouth every 6 (six) hours as needed. Patient not taking: Reported on 07/05/2018 06/09/18   Orpah Greek, MD    Family History Family History  Problem Relation Age of Onset  . Asthma Brother   . Breast cancer Cousin     Social History Social History   Tobacco Use  . Smoking status: Former Smoker    Packs/day: 0.25    Years: 20.00    Pack years: 5.00    Types: Cigarettes    Last attempt to quit: 05/04/2016    Years since quitting: 2.1  . Smokeless tobacco: Never Used  Substance Use Topics  . Alcohol use: Yes    Alcohol/week: 0.6 oz    Types: 1 Glasses of wine per week    Comment: occasional  . Drug use: No     Allergies   Latex; Raspberry; and Vioxx  [rofecoxib]   Review of Systems Review of Systems  HENT: Positive for trouble swallowing and voice change.   Cardiovascular: Positive for chest pain.  Musculoskeletal: Positive for neck pain.  All other systems reviewed and are negative.    Physical Exam Updated Vital Signs BP 112/75   Pulse 71   Temp 98.8 F (37.1 C)   Resp 12   Ht 5\' 4"  (1.626 m)   Wt 96.6 kg (213 lb)   LMP 11/14/2017   SpO2 97%   BMI 36.56 kg/m   Physical Exam  Constitutional: She is oriented to person, place, and time. She appears well-developed  and well-nourished. No distress.  Non toxic  HENT:  Head: Normocephalic and atraumatic.  Nose: Nose normal.  Moist mucous membranes. Oropharynx and tonsils normal. Uvula midline. No trismus.   Eyes: Pupils are equal, round, and reactive to light. Conjunctivae and EOM are normal.  Neck: Normal range of motion.  Diffuse anterior, bilateral, and posterior neck tenderness with light touch. Midline c-spine tenderness without step offs. Pain reproduced with neck rotation and bend R>L. No rigidity or meningismus.   Cardiovascular: Normal rate, regular rhythm and intact distal pulses.  Diffuse anterior central chest wall tenderness. Tenderness to right trapezius and RUE. Pain with deep inspiration. Pain with sitting up in bed, moving arms up and against resistance. 2+ DP and radial pulses bilaterally. No LE edema or calf tenderness.    Pulmonary/Chest: Effort normal and breath sounds normal.  Abdominal: Soft. Bowel sounds are normal. There is no tenderness.  No epigastric tenderness. No G/R/R. No suprapubic or CVA tenderness. Negative Murphy's and McBurney's.   Musculoskeletal: Normal range of motion.  Neurological: She is alert and oriented to person, place, and time.  Skin: Skin is warm and dry. Capillary refill takes less than 2 seconds.  Discrete non tender white pustules sparsely to ventral and dorsal palms, fingers and dorsal feet. No rash on soles.  No  surrounding erythema, edema, fluctuance warmth.   Psychiatric: She has a normal mood and affect. Her behavior is normal.  Nursing note and vitals reviewed.        ED Treatments / Results  Labs (all labs ordered are listed, but only abnormal results are displayed) Labs Reviewed  BASIC METABOLIC PANEL - Abnormal; Notable for the following components:      Result Value   Glucose, Bld 111 (*)    Calcium 8.7 (*)    All other components within normal limits  CBC  I-STAT TROPONIN, ED    EKG None  Radiology Dg Chest 2 View  Result Date: 07/05/2018 CLINICAL DATA:  Chest pain. EXAM: CHEST - 2 VIEW COMPARISON:  06/11/2018 FINDINGS: The heart size and mediastinal contours are within normal limits. Both lungs are clear. The visualized skeletal structures are unremarkable. IMPRESSION: No active cardiopulmonary disease. Electronically Signed   By: Kerby Moors M.D.   On: 07/05/2018 07:52    Procedures Procedures (including critical care time)  Medications Ordered in ED Medications  HYDROcodone-acetaminophen (HYCET) 7.5-325 mg/15 ml solution 10 mL (10 mLs Oral Given 07/05/18 0817)  gi cocktail (Maalox,Lidocaine,Donnatal) (30 mLs Oral Given 07/05/18 0817)     Initial Impression / Assessment and Plan / ED Course  I have reviewed the triage vital signs and the nursing notes.  Pertinent labs & imaging results that were available during my care of the patient were reviewed by me and considered in my medical decision making (see chart for details).     Symptoms sound GERD vs MSK vs radicular in nature.  Initially hypertensive in ER but did not take BP meds this morning.  Distal pulses symmetric bilaterally, no widening of mediastinum on CXR and her pain has been ongoing for 1 month making dissection unlikely.  HEART score <3.  I reviewed pt's previous ER visits and has had reassuring work up including CTA negative for PE and serial troponins. Unchanged EKG and work up today.  She was given  GI cocktail and hycet and had mild improvement in pain. Observed to be sitting up and moving in bed without significant discomfort. Discussed etiology unclear.  She understands she needs to  f/u with PCP. May benefit from referral to GI vs cardiology.  She does have known c-spine bulging disc and pinched nerve and neck pain, arm pain may be radicular in nature.  No unilateral weakness or loss of sensation. No recent trauma. No indication for further emergent imaging or work up today. Will dc with PPI, carfate, hycet. Discussed return preacutions. She is in agreement.   Final Clinical Impressions(s) / ED Diagnoses   Final diagnoses:  Chest wall pain    ED Discharge Orders        Ordered    HYDROcodone-acetaminophen (HYCET) 7.5-325 mg/15 ml solution  4 times daily PRN     07/05/18 1048    sucralfate (CARAFATE) 1 GM/10ML suspension  3 times daily with meals & bedtime     07/05/18 1048    omeprazole (PRILOSEC) 20 MG capsule  Daily     07/05/18 1048       Arlean Hopping 07/05/18 1756    Lacretia Leigh, MD 07/06/18 [phone]

## 2018-07-05 NOTE — ED Notes (Signed)
Pt transported from Triage to x-ray. Pt will be taken to room from x-ray.

## 2018-08-03 ENCOUNTER — Encounter (HOSPITAL_COMMUNITY): Payer: Self-pay

## 2018-08-03 ENCOUNTER — Other Ambulatory Visit: Payer: Self-pay

## 2018-08-03 ENCOUNTER — Emergency Department (HOSPITAL_COMMUNITY)
Admission: EM | Admit: 2018-08-03 | Discharge: 2018-08-03 | Disposition: A | Discharge status: Home / Self Care | Payer: Medicaid Other | Attending: Emergency Medicine | Admitting: Emergency Medicine

## 2018-08-03 DIAGNOSIS — I10 Essential (primary) hypertension: Secondary | ICD-10-CM | POA: Insufficient documentation

## 2018-08-03 DIAGNOSIS — Z87891 Personal history of nicotine dependence: Secondary | ICD-10-CM | POA: Insufficient documentation

## 2018-08-03 DIAGNOSIS — Z79899 Other long term (current) drug therapy: Secondary | ICD-10-CM | POA: Insufficient documentation

## 2018-08-03 DIAGNOSIS — L299 Pruritus, unspecified: Secondary | ICD-10-CM | POA: Diagnosis not present

## 2018-08-03 DIAGNOSIS — L509 Urticaria, unspecified: Secondary | ICD-10-CM

## 2018-08-03 DIAGNOSIS — Z91040 Latex allergy status: Secondary | ICD-10-CM | POA: Insufficient documentation

## 2018-08-03 DIAGNOSIS — M545 Low back pain: Secondary | ICD-10-CM | POA: Diagnosis not present

## 2018-08-03 DIAGNOSIS — G8929 Other chronic pain: Secondary | ICD-10-CM | POA: Insufficient documentation

## 2018-08-03 DIAGNOSIS — M549 Dorsalgia, unspecified: Secondary | ICD-10-CM

## 2018-08-03 DIAGNOSIS — R21 Rash and other nonspecific skin eruption: Secondary | ICD-10-CM | POA: Diagnosis present

## 2018-08-03 MED ORDER — FAMOTIDINE 20 MG PO TABS: 20.0000 mg | ORAL_TABLET | Freq: Two times a day (BID) | ORAL | 0 refills | Status: DC

## 2018-08-03 MED ORDER — PREDNISONE 20 MG PO TABS: 40.0000 mg | ORAL_TABLET | Freq: Every day | ORAL | 0 refills | Status: DC

## 2018-08-03 NOTE — ED Triage Notes (Addendum)
PT C/O A RASH AND ITCHING TO THE SCALP, BACK, LEGS, AND FEET SINCE WAKING UP THIS MORNING. PT STS SHE HAS NO KNOWN ALLERGIES. PT TOOK BENADRYL AT 10A, WITH MINIM,AL RELIEF. PT ASLO C/O CHRONIC LOWER BACK PAIN.

## 2018-08-03 NOTE — ED Provider Notes (Signed)
[facility] DEPT Provider Note   CSN: [id number] Arrival date & time: 08/03/18  2015     History   Chief Complaint Chief Complaint  Patient presents with  . Rash  . Back Pain    lower     HPI [name] is a 37 y.o. female who presents for evaluation of rash, itching to scalp, bilateral lower extremities and back.  Patient reports that this began this morning.  Patient reports that she felt "bumps on the back of the scalp and neck."  She states that they are pruritic but not painful.  She states the area "felt like they had a fever" but denies any surrounding erythema, purulent drainage.  Patient reports she took Benadryl this morning with minimal improvement.  Patient reports she has very sensitive skin and will often break out into a rash when exposed to do things.  Patient also reports some of but states that son, she takes breaks on the rash.  She states she has not had any new contacts, soaps, lotions, detergents, perfumes.  Does not eat any weird food.  Patient reports she has not had any new medications.  Patient also presenting with back pain that she states is chronic in nature.  Patient states that she has a history of lower back pain and states that it has c continue to hurt.  She states she follows up with her primary care doctor who told her "she had a pinched nerve."  She states she has not had any further evaluation.  Patient denies any new or changes in symptoms.  She denies any preceding trauma, injury, fall.  She denies any numbness/weakness of her arms or legs, difficulty ambulating, fevers, saddle anesthesia, urinary or bowel incontinence.  Additionally, patient reports she has had papules on the palms of her hands for several months.  She went to the dermatologist who told her he did not feel like this was infectious.  Patient states that they have continue to persist.  She states no new changes in these papules.  Patient denies any  chest pain, difficulty breathing, fevers, difficulty ambulating.  The history is provided by the patient.    Past Medical History:  Diagnosis Date  . Anxiety   . Arthritis   . Asthma    meds as needed  . Bronchitis   . Chronic back pain   . Ectopic pregnancy    requiring surgery  . Elevated liver enzymes   . GERD (gastroesophageal reflux disease)   . Gonorrhea   . Hypertension   . Irritable bowel   . Migraine headache   . Peripheral vascular disease ([place])   . Pneumonia    2017  . Preterm labor   . 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 Post-operative state 11/18/2017  . Fibroid uterus 11/02/2015  . Abnormal uterine bleeding (AUB) 11/02/2015  . Asthma exacerbation 03/23/2015  . Tobacco abuse 03/23/2015  . History of migraine 03/23/2015  . Acute asthma exacerbation   . Cough   . Acute bronchitis   . Asthma 03/22/2015  . Migraine, unspecified, without mention of intractable migraine without mention of status migrainosus 03/07/2014    Past Surgical History:  Procedure Laterality Date  . ABDOMINAL HYSTERECTOMY    . DILITATION & CURRETTAGE/HYSTROSCOPY WITH NOVASURE ABLATION N/A 02/20/2016   Procedure: HYSTEROSCOPY WITH NOVASURE ABLATION;  Surgeon: [name], MD;  Location: [facility];  Service: Gynecology;  Laterality: N/A;  . LAPAROSCOPIC VAGINAL HYSTERECTOMY WITH SALPINGECTOMY Left 11/18/2017   Procedure: LAPAROSCOPIC ASSISTED VAGINAL HYSTERECTOMY WITH SALPINGECTOMY;  Surgeon: Emily Filbert, MD;  Location: [facility];  Service: Gynecology;  Laterality: Left;  . LAPAROSCOPY  05/30/2012   Procedure: LAPAROSCOPY OPERATIVE;  Surgeon: Osborne Oman, MD;  Location: [facility];  Service: Gynecology;  Laterality: N/A;  Operative laparoscopy right salpingectomy and removal of ectopic pregnancy  . LAPAROSCOPY FOR ECTOPIC PREGNANCY    . SALPINGECTOMY    . TUBAL LIGATION  2005  . WISDOM TOOTH EXTRACTION        OB History    Gravida  5   Para  3   Term  2   Preterm  1   AB  2   Living  3     SAB  1   TAB      Ectopic  1   Multiple      Live Births               Home Medications    Prior to Admission medications   Medication Sig Start Date End Date Taking? Authorizing Provider  acetaminophen (TYLENOL) 650 MG CR tablet Take 1,300 mg by mouth every 8 (eight) hours as needed for pain.    [provider]  citalopram (CELEXA) 10 MG tablet Take 10 mg by mouth every evening. 06/16/18   [provider]  cyclobenzaprine (FLEXERIL) 10 MG tablet Take 1 tablet (10 mg total) by mouth 2 (two) times daily as needed for muscle spasms. Patient not taking: Reported on 07/05/2018 06/11/18   Quintella Reichert, MD  diphenhydrAMINE (BENADRYL) 25 MG tablet Take 25 mg by mouth as needed for itching or allergies.    [provider]  famotidine (PEPCID) 20 MG tablet Take 1 tablet (20 mg total) by mouth 2 (two) times daily for 4 days. 08/03/18 08/07/18  Volanda Napoleon, PA-C  hydrochlorothiazide (HYDRODIURIL) 12.5 MG tablet Take 12.5 mg daily by mouth. 09/18/17   [provider]  HYDROcodone-acetaminophen (HYCET) 7.5-325 mg/15 ml solution Take 15 mLs by mouth 4 (four) times daily as needed for moderate pain. 07/05/18 07/05/19  Kinnie Feil, PA-C  ibuprofen (ADVIL,MOTRIN) 800 MG tablet Take 1 tablet (800 mg total) by mouth 3 (three) times daily. Patient not taking: Reported on 07/05/2018 06/11/18   Quintella Reichert, MD  omeprazole (PRILOSEC) 20 MG capsule Take 1 capsule (20 mg total) by mouth daily. 07/05/18 08/04/18  Kinnie Feil, PA-C  predniSONE (DELTASONE) 20 MG tablet Take 2 tablets (40 mg total) by mouth daily for 4 days. 08/03/18 08/07/18  Volanda Napoleon, PA-C  sucralfate (CARAFATE) 1 GM/10ML suspension Take 10 mLs (1 g total) by mouth 4 (four) times daily -  with meals and at bedtime. 07/05/18   Kinnie Feil, PA-C  traMADol (ULTRAM) 50 MG tablet Take  1 tablet (50 mg total) by mouth every 6 (six) hours as needed. Patient not taking: Reported on 07/05/2018 06/09/18   Orpah Greek, MD    Family History Family History  Problem Relation Age of Onset  . Asthma Brother   . Breast cancer Cousin     Social History Social History   Tobacco Use  . Smoking status: Former Smoker    Packs/day: 0.25    Years: 20.00    Pack years: 5.00    Types: Cigarettes    Last attempt to quit: 05/04/2016    Years since quitting: 2.2  .  Smokeless tobacco: Never Used  Substance Use Topics  . Alcohol use: Yes    Alcohol/week: 1.0 standard drinks    Types: 1 Glasses of wine per week    Comment: occasional  . Drug use: No     Allergies   Latex; Raspberry; and Vioxx [rofecoxib]   Review of Systems Review of Systems  Constitutional: Negative for fever.  HENT: Negative for trouble swallowing.   Respiratory: Negative for shortness of breath.   Cardiovascular: Negative for leg swelling.  Gastrointestinal: Negative for vomiting.  Musculoskeletal: Positive for back pain (chronic).  Skin: Positive for rash.  Neurological: Negative for weakness and numbness.  All other systems reviewed and are negative.    Physical Exam Updated Vital Signs BP (!) 137/94 (BP Location: Right Arm)   Pulse 85   Temp 98.6 F (37 C) (Oral)   Resp 16   Ht 5\' 4"  (1.626 m)   Wt 96.2 kg   LMP 11/14/2017   SpO2 100%   BMI 36.39 kg/m   Physical Exam  Constitutional: She is oriented to person, place, and time. She appears well-developed and well-nourished.  HENT:  Head: Normocephalic and atraumatic.  Mouth/Throat: Oropharynx is clear and moist and mucous membranes are normal.  Airways patent, phonation is intact.  Posterior oropharynx is without any erythema, edema.  No oral angioedema.  No oral lesions.  Diffuse urticaria noted to posterior scalp.  No surrounding warmth, erythema, induration.  No deformity or crepitus noted.  Scattered urticaria noted to  posterior and anterior neck.  No surrounding warmth, erythema.  Eyes: Pupils are equal, round, and reactive to light. Conjunctivae, EOM and lids are normal.  Neck: Full passive range of motion without pain.  Cardiovascular: Normal rate, regular rhythm, normal heart sounds and normal pulses. Exam reveals no gallop and no friction rub.  No murmur heard. Pulmonary/Chest: Effort normal and breath sounds normal.  Abdominal: Soft. Normal appearance. There is no tenderness. There is no rigidity and no guarding.  Musculoskeletal: Normal range of motion.  Neurological: She is alert and oriented to person, place, and time.  Follows commands, Moves all extremities  5/5 strength to BUE and BLE  Sensation intact throughout all major nerve distributions Normal gait  Skin: Skin is warm and dry. Capillary refill takes less than 2 seconds.  Scattered urticaria noted to the posterior aspect of bilateral lower extremities appear to be resolving.  No surrounding warmth, erythema.  No rash noted on palms or soles of feet.  1-2 white pustules noted to palmar aspect of hand (this appears consistent with previous records.)   Psychiatric: She has a normal mood and affect. Her speech is normal.  Nursing note and vitals reviewed.    ED Treatments / Results  Labs (all labs ordered are listed, but only abnormal results are displayed) Labs Reviewed - No data to display  EKG None  Radiology No results found.  Procedures Procedures (including critical care time)  Medications Ordered in ED Medications - No data to display   Initial Impression / Assessment and Plan / ED Course  I have reviewed the triage vital signs and the nursing notes.  Pertinent labs & imaging results that were available during my care of the patient were reviewed by me and considered in my medical decision making (see chart for details).     37 y.o. female who presents for evaluation of rash that began this morning.  She states is  pruritic.  Not painful.  No fevers.  Took Benadryl  with minimal improvement.  Does report of history of skin sensitivity.  Also complaining of chronic back pain and pustules noted to hands that she has already had evaluated by dermatology.  Chronic back pain has been an issue for several years.  She denies any new trauma, injury, new symptoms.  She states that she still has pain. Patient is afebrile, non-toxic appearing, sitting comfortably on examination table. Vital signs reviewed and stable.  On exam, she has scattered urticaria noted to scalp, neck, bilateral lower extremity's.  Patient states that the urticaria to bilateral extremities.  To be resolving after Benadryl use.  These urticaria do not appear infectious in nature.  No evidence of abscesses that required drainage in the ED.  Additionally, patient has no oral lesions, rash would be concerning for hand-foot-and-mouth, recommended spotted fever, syphilis, SJS, TENS.  Suspect this to be allergic urticaria.  Unsure what source is.  Patient also reporting back pain.  This appears to be chronic in nature.  No new changes in symptoms.  Given that this is patient's chronic pain and there are noted changes in symptoms, no injury, no indication for imaging here in ED.  Additionally, patient stating that she has white pustules to palms that have continue to persist.  She seen dermatology.  No change in symptoms.  No indication for further evaluation here in the ED.  We will plan to treat symptomatically.  Patient instructed to follow-up with primary care doctor.  We will plan to give outpatient neurosurgery referral for patient's continued chronic back pain. Patient had ample opportunity for questions and discussion. All patient's questions were answered with full understanding. Strict return precautions discussed. Patient expresses understanding and agreement to plan.   Final Clinical Impressions(s) / ED Diagnoses   Final diagnoses:  Urticaria  Chronic  bilateral back pain, unspecified back location    ED Discharge Orders         Ordered    famotidine (PEPCID) 20 MG tablet  2 times daily     08/03/18 2133    predniSONE (DELTASONE) 20 MG tablet  Daily,   Status:  Discontinued     08/03/18 2133    predniSONE (DELTASONE) 20 MG tablet  Daily     08/03/18 2133           Volanda Napoleon, PA-C 08/03/18 2208    Little, Wenda Overland, MD 08/06/18 1358

## 2018-08-03 NOTE — Discharge Instructions (Signed)
As we discussed, there is some diffuse hives noted to the area.  He may have had an allergic reaction to something.  Take Benadryl, Pepcid, prednisone as directed.  Follow-up with your primary care doctor next 2 to 3 days for further evaluation.  As we discussed, please follow-up with the referred neurosurgeon for further evaluation of your chronic back pain.  Return the emergency emergency department for any fever, difficulty breathing, vomiting, worsening rash, redness or swelling, drainage around the hives, numbness/weakness of your arms or legs, difficulty walking or any other worsening or concerning symptoms.

## 2018-08-06 ENCOUNTER — Ambulatory Visit (HOSPITAL_COMMUNITY)
Admission: EM | Admit: 2018-08-06 | Discharge: 2018-08-06 | Disposition: A | Discharge status: Home / Self Care | Payer: Medicaid Other | Attending: Family Medicine | Admitting: Family Medicine

## 2018-08-06 ENCOUNTER — Other Ambulatory Visit: Payer: Self-pay

## 2018-08-06 ENCOUNTER — Encounter (HOSPITAL_COMMUNITY): Payer: Self-pay | Admitting: Family Medicine

## 2018-08-06 DIAGNOSIS — L500 Allergic urticaria: Secondary | ICD-10-CM

## 2018-08-06 MED ORDER — METHYLPREDNISOLONE ACETATE 80 MG/ML IJ SUSP
INTRAMUSCULAR | Status: AC
  Filled 2018-08-06: qty 1

## 2018-08-06 MED ORDER — METHYLPREDNISOLONE ACETATE 80 MG/ML IJ SUSP
80.0000 mg | Freq: Once | INTRAMUSCULAR | Status: AC
  Administered 2018-08-06: 80 mg via INTRAMUSCULAR

## 2018-08-06 MED ORDER — ACYCLOVIR 400 MG PO TABS: 400.0000 mg | ORAL_TABLET | Freq: Three times a day (TID) | ORAL | 5 refills | Status: DC

## 2018-08-06 NOTE — ED Triage Notes (Signed)
Noticed Monday, bumps on left hand side of face and scalp and was treated and released.  Symptoms worsened, rash and itching all over her body.  Reports hands swollen.  Patient says it is hard to swallow

## 2018-08-06 NOTE — Discharge Instructions (Addendum)
Stop the HCTZ medicine, stop the prednisone, stop the famotidine, and stop the hydroxyzine  Take cool showers  Follow-up with your primary care doctor for blood pressure control

## 2018-08-06 NOTE — ED Provider Notes (Signed)
[facility]    CSN: [id number] Arrival date & time: 08/06/18  1629     History   Chief Complaint Chief Complaint  Patient presents with  . Urticaria    HPI [name] is a 37 y.o. female.   Noticed Monday, bumps on left hand side of face and scalp and was treated and released.  Symptoms worsened, rash and itching all over her body.  Reports hands swollen.  Patient says it is hard to swallow  This is a long-term problem for this patient.  She see dermatologist in the past and nobody helped her.  Sunlight makes the rash worse.  She has had a problem with HSV 1.  Note from 8/12: [name] is a 37 y.o. female who presents for evaluation of rash, itching to scalp, bilateral lower extremities and back.  Patient reports that this began this morning.  Patient reports that she felt "bumps on the back of the scalp and neck."  She states that they are pruritic but not painful.  She states the area "felt like they had a fever" but denies any surrounding erythema, purulent drainage.  Patient reports she took Benadryl this morning with minimal improvement.  Patient reports she has very sensitive skin and will often break out into a rash when exposed to do things.  Patient also reports some of but states that son, she takes breaks on the rash.  She states she has not had any new contacts, soaps, lotions, detergents, perfumes.  Does not eat any weird food.  Patient reports she has not had any new medications.  Patient also presenting with back pain that she states is chronic in nature.  Patient states that she has a history of lower back pain and states that it has c continue to hurt.  She states she follows up with her primary care doctor who told her "she had a pinched nerve."  She states she has not had any further evaluation.  Patient denies any new or changes in symptoms.  She denies any preceding trauma, injury, fall.  She denies any numbness/weakness of her arms or  legs, difficulty ambulating, fevers, saddle anesthesia, urinary or bowel incontinence.  Additionally, patient reports she has had papules on the palms of her hands for several months.  She went to the dermatologist who told her he did not feel like this was infectious.  Patient states that they have continue to persist.  She states no new changes in these papules.  Patient denies any chest pain, difficulty breathing, fevers, difficulty ambulating.      Past Medical History:  Diagnosis Date  . Anxiety   . Arthritis   . Asthma    meds as needed  . Bronchitis   . Chronic back pain   . Ectopic pregnancy    requiring surgery  . Elevated liver enzymes   . GERD (gastroesophageal reflux disease)   . Gonorrhea   . Hypertension   . Irritable bowel   . Migraine headache   . Peripheral vascular disease ([place])   . Pneumonia    2017  . Preterm labor   . 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 Post-operative state 11/18/2017  . Fibroid uterus 11/02/2015  . Abnormal uterine bleeding (AUB) 11/02/2015  . Asthma exacerbation 03/23/2015  . Tobacco abuse 03/23/2015  . History of migraine 03/23/2015  . Acute asthma exacerbation   .  Cough   . Acute bronchitis   . Asthma 03/22/2015  . Migraine, unspecified, without mention of intractable migraine without mention of status migrainosus 03/07/2014    Past Surgical History:  Procedure Laterality Date  . ABDOMINAL HYSTERECTOMY    . DILITATION & CURRETTAGE/HYSTROSCOPY WITH NOVASURE ABLATION N/A 02/20/2016   Procedure: HYSTEROSCOPY WITH NOVASURE ABLATION;  Surgeon: [name], MD;  Location: [facility];  Service: Gynecology;  Laterality: N/A;  . LAPAROSCOPIC VAGINAL HYSTERECTOMY WITH SALPINGECTOMY Left 11/18/2017   Procedure: LAPAROSCOPIC ASSISTED VAGINAL HYSTERECTOMY WITH SALPINGECTOMY;  Surgeon: Emily Filbert, MD;  Location: [facility];  Service: Gynecology;  Laterality: Left;    . LAPAROSCOPY  05/30/2012   Procedure: LAPAROSCOPY OPERATIVE;  Surgeon: Osborne Oman, MD;  Location: [facility];  Service: Gynecology;  Laterality: N/A;  Operative laparoscopy right salpingectomy and removal of ectopic pregnancy  . LAPAROSCOPY FOR ECTOPIC PREGNANCY    . SALPINGECTOMY    . TUBAL LIGATION  2005  . WISDOM TOOTH EXTRACTION      OB History    Gravida  5   Para  3   Term  2   Preterm  1   AB  2   Living  3     SAB  1   TAB      Ectopic  1   Multiple      Live Births               Home Medications    Prior to Admission medications   Medication Sig Start Date End Date Taking? Authorizing Provider  cholecalciferol (VITAMIN D) 1000 units tablet Take 1,000 Units by mouth daily.   Yes [provider]  diphenhydrAMINE (BENADRYL) 25 MG tablet Take 25 mg by mouth as needed for itching or allergies.   Yes [provider]  acetaminophen (TYLENOL) 650 MG CR tablet Take 1,300 mg by mouth every 8 (eight) hours as needed for pain.    [provider]  acyclovir (ZOVIRAX) 400 MG tablet Take 1 tablet (400 mg total) by mouth 3 (three) times daily. 08/06/18   Robyn Haber, MD  omeprazole (PRILOSEC) 20 MG capsule Take 1 capsule (20 mg total) by mouth daily. 07/05/18 08/04/18  Kinnie Feil, PA-C    Family History Family History  Problem Relation Age of Onset  . Asthma Brother   . Breast cancer Cousin     Social History Social History   Tobacco Use  . Smoking status: Former Smoker    Packs/day: 0.25    Years: 20.00    Pack years: 5.00    Types: Cigarettes    Last attempt to quit: 05/04/2016    Years since quitting: 2.2  . Smokeless tobacco: Never Used  Substance Use Topics  . Alcohol use: Yes    Alcohol/week: 1.0 standard drinks    Types: 1 Glasses of wine per week    Comment: occasional  . Drug use: No     Allergies   Latex; Raspberry; and Vioxx [rofecoxib]   Review of Systems Review of Systems   Physical  Exam Triage Vital Signs ED Triage Vitals [08/06/18 1707]  Enc Vitals Group     BP      Pulse      Resp      Temp      Temp src      SpO2      Weight      Height      Head Circumference  Peak Flow      Pain Score 10     Pain Loc      Pain Edu?      Excl. in [place]?    No data found.  Updated Vital Signs BP (!) 107/44 (BP Location: Right Arm) Comment: large cuff  Pulse 93   Temp 98.8 F (37.1 C) (Oral)   Resp 20   LMP 11/14/2017   SpO2 97%   Visual Acuity Right Eye Distance:   Left Eye Distance:   Bilateral Distance:    Right Eye Near:   Left Eye Near:    Bilateral Near:     Physical Exam  Constitutional: She appears well-developed and well-nourished.  HENT:  Mouth/Throat: Oropharynx is clear and moist.  Eyes: Pupils are equal, round, and reactive to light. Conjunctivae are normal.  Neck: Normal range of motion. Neck supple.  Cardiovascular: Normal rate, regular rhythm and normal heart sounds.  Pulmonary/Chest: Effort normal and breath sounds normal.  Skin:  Marked diffuse urticarial rash over entire body with wheels that have become confluent over her chest, neck, abdomen, arms, and legs  Psychiatric: She has a normal mood and affect.  Nursing note and vitals reviewed.    UC Treatments / Results  Labs (all labs ordered are listed, but only abnormal results are displayed) Labs Reviewed - No data to display  EKG None  Radiology No results found.  Procedures Procedures (including critical care time)  Medications Ordered in UC Medications  methylPREDNISolone acetate (DEPO-MEDROL) injection 80 mg (has no administration in time range)    Initial Impression / Assessment and Plan / UC Course  I have reviewed the triage vital signs and the nursing notes.  Pertinent labs & imaging results that were available during my care of the patient were reviewed by me and considered in my medical decision making (see chart for details).    Final Clinical  Impressions(s) / UC Diagnoses   Final diagnoses:  Allergic urticaria     Discharge Instructions     Stop the HCTZ medicine, stop the prednisone, stop the famotidine, and stop the hydroxyzine  Take cool showers  Follow-up with your primary care doctor for blood pressure control    ED Prescriptions    Medication Sig Dispense Auth. Provider   acyclovir (ZOVIRAX) 400 MG tablet Take 1 tablet (400 mg total) by mouth 3 (three) times daily. 90 tablet Robyn Haber, MD     Controlled Substance Prescriptions [place] Controlled Substance Registry consulted? Not Applicable   Robyn Haber, MD 08/06/18 1729

## 2018-08-07 ENCOUNTER — Encounter (HOSPITAL_COMMUNITY): Payer: Self-pay | Admitting: Emergency Medicine

## 2018-08-07 ENCOUNTER — Other Ambulatory Visit: Payer: Self-pay

## 2018-08-07 ENCOUNTER — Emergency Department (HOSPITAL_COMMUNITY)
Admission: EM | Admit: 2018-08-07 | Discharge: 2018-08-08 | Disposition: A | Discharge status: Home / Self Care | Payer: Medicaid Other | Attending: Emergency Medicine | Admitting: Emergency Medicine

## 2018-08-07 DIAGNOSIS — J45909 Unspecified asthma, uncomplicated: Secondary | ICD-10-CM | POA: Insufficient documentation

## 2018-08-07 DIAGNOSIS — Z91040 Latex allergy status: Secondary | ICD-10-CM | POA: Insufficient documentation

## 2018-08-07 DIAGNOSIS — Z79899 Other long term (current) drug therapy: Secondary | ICD-10-CM | POA: Diagnosis not present

## 2018-08-07 DIAGNOSIS — Z87891 Personal history of nicotine dependence: Secondary | ICD-10-CM | POA: Diagnosis not present

## 2018-08-07 DIAGNOSIS — I10 Essential (primary) hypertension: Secondary | ICD-10-CM | POA: Diagnosis not present

## 2018-08-07 DIAGNOSIS — L509 Urticaria, unspecified: Secondary | ICD-10-CM

## 2018-08-07 NOTE — ED Triage Notes (Signed)
Patient is complaining of hives. Patient states she was given medication 08/03/2018. Patient states that it is not working. Patient states that she feel like her throat is closing. Patient states that she feels like it is her eyes. Patient feels like it is getting worse. Patient is talking without any difficulty.

## 2018-08-08 MED ORDER — HYDROXYZINE HCL 25 MG PO TABS: 25.0000 mg | ORAL_TABLET | Freq: Four times a day (QID) | ORAL | 0 refills | Status: DC

## 2018-08-08 MED ORDER — HYDROXYZINE HCL 25 MG PO TABS
50.0000 mg | ORAL_TABLET | Freq: Once | ORAL | Status: AC
  Administered 2018-08-08: 50 mg via ORAL
  Filled 2018-08-08: qty 2

## 2018-08-08 NOTE — ED Provider Notes (Signed)
[facility] DEPT Provider Note   CSN: [id number] Arrival date & time: 08/07/18  2106     History   Chief Complaint Chief Complaint  Patient presents with  . Urticaria    HPI [name] is a 37 y.o. female.  37 yo F with a chief complaint of a diffuse itchy rash.  This been going on for at least a week.  Patient has seen a dermatologist and this will be her third visit to a urgent care or emergency department.  She did have a follow-up with a dermatologist but she is to come to the emergency department instead.  She felt that the rash has gotten significantly worse today she feels that she is having trouble breathing that it is extending her way into her mouth throat and eyes.  She is currently on Valtrex as well as was given a shot of Depo-Medrol yesterday.  She is taking Benadryl but not having any improvement.  She denies any new dyes or lotions or soaps.  Rash started on the back of her head and then she feels is moved across her whole body.  Feels that it is impacting her palms and soles as well as in her mouth and eyes.   Urticaria  Pertinent negatives include no chest pain, no headaches and no shortness of breath.    Past Medical History:  Diagnosis Date  . Anxiety   . Arthritis   . Asthma    meds as needed  . Bronchitis   . Chronic back pain   . Ectopic pregnancy    requiring surgery  . Elevated liver enzymes   . GERD (gastroesophageal reflux disease)   . Gonorrhea   . Hypertension   . Irritable bowel   . Migraine headache   . Peripheral vascular disease ([place])   . Pneumonia    2017  . Preterm labor   . 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 Post-operative state 11/18/2017  . Fibroid uterus 11/02/2015  . Abnormal uterine bleeding (AUB) 11/02/2015  . Asthma exacerbation 03/23/2015  . Tobacco abuse 03/23/2015  . History of migraine 03/23/2015  .  Acute asthma exacerbation   . Cough   . Acute bronchitis   . Asthma 03/22/2015  . Migraine, unspecified, without mention of intractable migraine without mention of status migrainosus 03/07/2014    Past Surgical History:  Procedure Laterality Date  . ABDOMINAL HYSTERECTOMY    . DILITATION & CURRETTAGE/HYSTROSCOPY WITH NOVASURE ABLATION N/A 02/20/2016   Procedure: HYSTEROSCOPY WITH NOVASURE ABLATION;  Surgeon: [name], MD;  Location: [facility];  Service: Gynecology;  Laterality: N/A;  . LAPAROSCOPIC VAGINAL HYSTERECTOMY WITH SALPINGECTOMY Left 11/18/2017   Procedure: LAPAROSCOPIC ASSISTED VAGINAL HYSTERECTOMY WITH SALPINGECTOMY;  Surgeon: Emily Filbert, MD;  Location: [facility];  Service: Gynecology;  Laterality: Left;  . LAPAROSCOPY  05/30/2012   Procedure: LAPAROSCOPY OPERATIVE;  Surgeon: Osborne Oman, MD;  Location: [facility];  Service: Gynecology;  Laterality: N/A;  Operative laparoscopy right salpingectomy and removal of ectopic pregnancy  . LAPAROSCOPY FOR ECTOPIC PREGNANCY    . SALPINGECTOMY    . TUBAL LIGATION  2005  . WISDOM TOOTH EXTRACTION       OB History    Gravida  5   Para  3   Term  2   Preterm  1   AB  2   Living  3  SAB  1   TAB      Ectopic  1   Multiple      Live Births               Home Medications    Prior to Admission medications   Medication Sig Start Date End Date Taking? Authorizing Provider  acetaminophen (TYLENOL) 650 MG CR tablet Take 1,300 mg by mouth every 8 (eight) hours as needed for pain.    [provider]  acyclovir (ZOVIRAX) 400 MG tablet Take 1 tablet (400 mg total) by mouth 3 (three) times daily. 08/06/18   Robyn Haber, MD  cholecalciferol (VITAMIN D) 1000 units tablet Take 1,000 Units by mouth daily.    [provider]  diphenhydrAMINE (BENADRYL) 25 MG tablet Take 25 mg by mouth as needed for itching or allergies.    [provider]  hydrOXYzine (ATARAX/VISTARIL) 25 MG tablet  Take 1 tablet (25 mg total) by mouth every 6 (six) hours. 08/08/18   Deno Etienne, DO  omeprazole (PRILOSEC) 20 MG capsule Take 1 capsule (20 mg total) by mouth daily. 07/05/18 08/04/18  Kinnie Feil, PA-C    Family History Family History  Problem Relation Age of Onset  . Asthma Brother   . Breast cancer Cousin     Social History Social History   Tobacco Use  . Smoking status: Former Smoker    Packs/day: 0.25    Years: 20.00    Pack years: 5.00    Types: Cigarettes    Last attempt to quit: 05/04/2016    Years since quitting: 2.2  . Smokeless tobacco: Never Used  Substance Use Topics  . Alcohol use: Yes    Alcohol/week: 1.0 standard drinks    Types: 1 Glasses of wine per week    Comment: occasional  . Drug use: No     Allergies   Latex; Raspberry; and Vioxx [rofecoxib]   Review of Systems Review of Systems  Constitutional: Negative for chills and fever.  HENT: Negative for congestion and rhinorrhea.   Eyes: Negative for redness and visual disturbance.  Respiratory: Negative for shortness of breath and wheezing.   Cardiovascular: Negative for chest pain and palpitations.  Gastrointestinal: Negative for nausea and vomiting.  Genitourinary: Negative for dysuria and urgency.  Musculoskeletal: Negative for arthralgias and myalgias.  Skin: Positive for rash. Negative for pallor and wound.  Neurological: Negative for dizziness and headaches.     Physical Exam Updated Vital Signs BP (!) 136/99 (BP Location: Left Arm)   Pulse (!) 104   Temp 98.8 F (37.1 C) (Oral)   Resp 18   Ht 5\' 4"  (1.626 m)   Wt 96.2 kg   LMP 11/14/2017   SpO2 98%   BMI 36.39 kg/m   Physical Exam  Constitutional: She is oriented to person, place, and time. She appears well-developed and well-nourished. No distress.  HENT:  Head: Normocephalic and atraumatic.  Tolerating secretions without difficulty no erythema, no intraoral lesions.  Eyes: Pupils are equal, round, and reactive to  light. EOM are normal.  No noted erythema  Neck: Normal range of motion. Neck supple.  Cardiovascular: Normal rate and regular rhythm. Exam reveals no gallop and no friction rub.  No murmur heard. Pulmonary/Chest: Effort normal. She has no wheezes. She has no rales.  Abdominal: Soft. She exhibits no distension and no mass. There is no tenderness. There is no guarding.  Musculoskeletal: She exhibits no edema or tenderness.  Neurological: She is alert and oriented  to person, place, and time.  Skin: Skin is warm and dry. Rash noted. She is not diaphoretic.  Patient has what appears to be hives diffusely across the face and neck and upper back.  No other focal areas are found that the patient is symptomatic diffusely across her body.  Psychiatric: She has a normal mood and affect. Her behavior is normal.  Nursing note and vitals reviewed.    ED Treatments / Results  Labs (all labs ordered are listed, but only abnormal results are displayed) Labs Reviewed - No data to display  EKG None  Radiology No results found.  Procedures Procedures (including critical care time)  Medications Ordered in ED Medications  hydrOXYzine (ATARAX/VISTARIL) tablet 50 mg (has no administration in time range)     Initial Impression / Assessment and Plan / ED Course  I have reviewed the triage vital signs and the nursing notes.  Pertinent labs & imaging results that were available during my care of the patient were reviewed by me and considered in my medical decision making (see chart for details).     37 yo F with a chief complaint of a diffuse rash.  Going on for the past week.  She is Artie seen a dermatologist as well as this is her third urgent visit.  My exam with rash mostly to the face neck and upper back.  I do not see the rash anywhere else on her body.  She is complaining that on her palms and soles though I do not see a rash there.  No intraoral lesions no conjunctival injection.  She is  afebrile.  We will start her on Atarax.  Suggested that she follow-up with her dermatologist.  PCP follow-up as well.  12:24 AM:  I have discussed the diagnosis/risks/treatment options with the patient and family and believe the pt to be eligible for discharge home to follow-up with PCP. We also discussed returning to the ED immediately if new or worsening sx occur. We discussed the sx which are most concerning (e.g., sudden worsening pain, fever, inability to tolerate by mouth) that necessitate immediate return. Medications administered to the patient during their visit and any new prescriptions provided to the patient are listed below.  Medications given during this visit Medications  hydrOXYzine (ATARAX/VISTARIL) tablet 50 mg (has no administration in time range)      The patient appears reasonably screen and/or stabilized for discharge and I doubt any other medical condition or other [facility] requiring further screening, evaluation, or treatment in the ED at this time prior to discharge.    Final Clinical Impressions(s) / ED Diagnoses   Final diagnoses:  Urticaria    ED Discharge Orders         Ordered    hydrOXYzine (ATARAX/VISTARIL) 25 MG tablet  Every 6 hours     08/08/18 0011           Deno Etienne, DO 08/08/18 0024

## 2018-08-08 NOTE — Discharge Instructions (Signed)
Try to avoid lotions, pefumes.  You rash is localized for me mostly on the face and neck.  Think about what you apply there and try to go to dye and lotion free products.

## 2019-01-22 ENCOUNTER — Inpatient Hospital Stay (HOSPITAL_COMMUNITY)
Admission: AD | Admit: 2019-01-22 | Discharge: 2019-01-22 | Disposition: A | Discharge status: Home / Self Care | Payer: Medicaid Other | Attending: Obstetrics and Gynecology | Admitting: Obstetrics and Gynecology

## 2019-01-22 ENCOUNTER — Inpatient Hospital Stay (HOSPITAL_COMMUNITY): Payer: Medicaid Other

## 2019-01-22 ENCOUNTER — Encounter (HOSPITAL_COMMUNITY): Payer: Self-pay | Admitting: *Deleted

## 2019-01-22 ENCOUNTER — Other Ambulatory Visit: Payer: Self-pay

## 2019-01-22 DIAGNOSIS — Z87891 Personal history of nicotine dependence: Secondary | ICD-10-CM | POA: Insufficient documentation

## 2019-01-22 DIAGNOSIS — R109 Unspecified abdominal pain: Secondary | ICD-10-CM | POA: Diagnosis not present

## 2019-01-22 DIAGNOSIS — R1030 Lower abdominal pain, unspecified: Secondary | ICD-10-CM | POA: Diagnosis present

## 2019-01-22 LAB — URINALYSIS, ROUTINE W REFLEX MICROSCOPIC
Bilirubin Urine: NEGATIVE
GLUCOSE, UA: NEGATIVE mg/dL
Hgb urine dipstick: NEGATIVE
Ketones, ur: NEGATIVE mg/dL
Leukocytes, UA: NEGATIVE
Nitrite: NEGATIVE
Protein, ur: NEGATIVE mg/dL
SPECIFIC GRAVITY, URINE: 1.01 (ref 1.005–1.030)
pH: 8.5 — ABNORMAL HIGH (ref 5.0–8.0)

## 2019-01-22 LAB — WET PREP, GENITAL
CLUE CELLS WET PREP: NONE SEEN
SPERM: NONE SEEN
Trich, Wet Prep: NONE SEEN
YEAST WET PREP: NONE SEEN

## 2019-01-22 MED ORDER — TRAMADOL HCL 50 MG PO TABS
50.0000 mg | ORAL_TABLET | Freq: Once | ORAL | Status: AC
  Administered 2019-01-22: 50 mg via ORAL
  Filled 2019-01-22: qty 1

## 2019-01-22 MED ORDER — TRAMADOL HCL 50 MG PO TABS: 50.0000 mg | ORAL_TABLET | Freq: Two times a day (BID) | ORAL | 0 refills | Status: AC | PRN

## 2019-01-22 NOTE — MAU Provider Note (Signed)
History     CSN: [id number]  Arrival date and time: 01/22/19 1409   First Provider Initiated Contact with Patient 01/22/19 1554      Chief Complaint  Patient presents with  . Abdominal Pain   [name] is a 38 y.o. female who presents for Abdominal Pain.  She states she has been having lower abdominal pain since her LAVH with Left Salpingectomy in Nov 2018.  However, in the last four days the pain has become more intense and constant.  She reports that the pain is 10/10 that she describes as a sharp dull menstrual type pain with a numbness and tingling sensation.  She reports that she has been taking an antibiotic and fungal cream that she thinks may have caused a yeast infection.  She denies vaginal bleeding and discharge, but reports itching and was using Monistat OTC, but states her "skin was coming off."  She denies constipation or diarrhea, problems with urination.  Patient reports taking 600mg  of Ibuprofen yesterday, but nothing for pain today.       Pertinent Gynecological History: Menses: None d/t Hysterectomy Bleeding: None Contraception: none DES exposure: unknown Blood transfusions: none Sexually transmitted diseases: past history: CT/Gonorrhea 01, H/O Syphilis 82, Trich 05 Previous GYN Procedures: LAVH  Last mammogram: N/A Date:  Last pap: N/A   Past Medical History:  Diagnosis Date  . Anxiety   . Arthritis   . Asthma    meds as needed  . Bronchitis   . Chronic back pain   . Ectopic pregnancy    requiring surgery  . Elevated liver enzymes   . GERD (gastroesophageal reflux disease)   . Gonorrhea   . Hypertension   . Irritable bowel   . Migraine headache   . Peripheral vascular disease ([place])   . Pneumonia    2017  . Preterm labor   . 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 ABDOMINAL HYSTERECTOMY    . DILITATION & CURRETTAGE/HYSTROSCOPY WITH NOVASURE ABLATION N/A 02/20/2016    Procedure: HYSTEROSCOPY WITH NOVASURE ABLATION;  Surgeon: [name], MD;  Location: [facility];  Service: Gynecology;  Laterality: N/A;  . LAPAROSCOPIC VAGINAL HYSTERECTOMY WITH SALPINGECTOMY Left 11/18/2017   Procedure: LAPAROSCOPIC ASSISTED VAGINAL HYSTERECTOMY WITH SALPINGECTOMY;  Surgeon: Emily Filbert, MD;  Location: [facility];  Service: Gynecology;  Laterality: Left;  . LAPAROSCOPY  05/30/2012   Procedure: LAPAROSCOPY OPERATIVE;  Surgeon: Osborne Oman, MD;  Location: [facility];  Service: Gynecology;  Laterality: N/A;  Operative laparoscopy right salpingectomy and removal of ectopic pregnancy  . LAPAROSCOPY FOR ECTOPIC PREGNANCY    . SALPINGECTOMY    . TUBAL LIGATION  2005  . WISDOM TOOTH EXTRACTION      Family History  Problem Relation Age of Onset  . Asthma Brother   . Breast cancer Cousin     Social History   Tobacco Use  . Smoking status: Former Smoker    Packs/day: 0.25    Years: 20.00    Pack years: 5.00    Types: Cigarettes    Last attempt to quit: 05/04/2016    Years since quitting: 2.7  . Smokeless tobacco: Never Used  Substance Use Topics  . Alcohol use: Yes    Alcohol/week: 1.0 standard drinks    Types: 1 Glasses of wine per week    Comment: occasional  . Drug use: No    Allergies:  Allergies  Allergen Reactions  .  Latex Hives  . Raspberry Hives, Itching and Swelling  . Vioxx [Rofecoxib] Hives    Medications Prior to Admission  Medication Sig Dispense Refill Last Dose  . acetaminophen (TYLENOL) 650 MG CR tablet Take 1,300 mg by mouth every 8 (eight) hours as needed for pain.   Unknown at Unknown time  . acyclovir (ZOVIRAX) 400 MG tablet Take 1 tablet (400 mg total) by mouth 3 (three) times daily. 90 tablet 5   . cholecalciferol (VITAMIN D) 1000 units tablet Take 1,000 Units by mouth daily.     . diphenhydrAMINE (BENADRYL) 25 MG tablet Take 25 mg by mouth as needed for itching or allergies.   08/05/2018 at Unknown time  . hydrOXYzine  (ATARAX/VISTARIL) 25 MG tablet Take 1 tablet (25 mg total) by mouth every 6 (six) hours. 12 tablet 0   . omeprazole (PRILOSEC) 20 MG capsule Take 1 capsule (20 mg total) by mouth daily. 30 capsule 0     Review of Systems  Constitutional: Negative for chills and fever.  Gastrointestinal: Positive for abdominal pain. Negative for constipation, diarrhea, nausea and vomiting.  Genitourinary: Positive for vaginal discharge. Negative for dyspareunia, dysuria and vaginal bleeding.   Physical Exam   Blood pressure 135/81, pulse 82, temperature 98.5 F (36.9 C), temperature source Oral, resp. rate 18, weight 95.5 kg, last menstrual period 11/14/2017, SpO2 100 %.  Physical Exam  Constitutional: She is oriented to person, place, and time. She appears well-developed and well-nourished.  HENT:  Head: Normocephalic and atraumatic.  Eyes: Conjunctivae are normal.  Neck: Normal range of motion.  Cardiovascular: Normal rate, regular rhythm and normal heart sounds.  Respiratory: Effort normal.  GI: Soft.  Genitourinary:    Vaginal discharge present.     Genitourinary Comments: Speculum Exam: -Vaginal Vault: Pink mucosa.  Cuff intact.  Moderate amt thick white discharge in vault -wet prep collected -Cervix: Absent -Bimanual Exam: Mild tenderness in cul de sac, UTA adnexa   Musculoskeletal: Normal range of motion.        General: No edema.  Neurological: She is alert and oriented to person, place, and time.  Skin: Skin is warm and dry.  Psychiatric: She has a normal mood and affect. Her behavior is normal.    MAU Course  Procedures Results for orders placed or performed during the hospital encounter of 01/22/19 (from the past 24 hour(s))  Urinalysis, Routine w reflex microscopic     Status: Abnormal   Collection Time: 01/22/19  2:40 PM  Result Value Ref Range   Color, Urine YELLOW YELLOW   APPearance CLEAR CLEAR   Specific Gravity, Urine 1.010 1.005 - 1.030   pH 8.5 (H) 5.0 - 8.0   Glucose,  UA NEGATIVE NEGATIVE mg/dL   Hgb urine dipstick NEGATIVE NEGATIVE   Bilirubin Urine NEGATIVE NEGATIVE   Ketones, ur NEGATIVE NEGATIVE mg/dL   Protein, ur NEGATIVE NEGATIVE mg/dL   Nitrite NEGATIVE NEGATIVE   Leukocytes, UA NEGATIVE NEGATIVE  Wet prep, genital     Status: Abnormal   Collection Time: 01/22/19  4:13 PM  Result Value Ref Range   Yeast Wet Prep HPF POC NONE SEEN NONE SEEN   Trich, Wet Prep NONE SEEN NONE SEEN   Clue Cells Wet Prep HPF POC NONE SEEN NONE SEEN   WBC, Wet Prep HPF POC FEW (A) NONE SEEN   Sperm NONE SEEN    FINDINGS: Uterus  Surgically absent.  Right ovary  Not discretely visualized.  No adnexal mass is seen.  Left ovary  Not discretely visualized. Status post left salpingectomy. No adnexal mass is seen.  Other findings  No abnormal free fluid.  IMPRESSION: Status post hysterectomy and left salpingectomy.  Bilateral ovaries are not discretely visualized. No adnexal mass is seen.  MDM Pelvic Exam Labs: UA & Wet prep TVUS   Assessment and Plan  38 year old  Abdominal Pain  -Exam findings discussed -Wet prep pending -Tramadol 50mg  now for pain -Send for TVUS to r/o Ovarian torsion -Will await results  Follow Up (1725)  -[place] returns without significant findings -Wet prep returns without significant findings -Results discussed with patient  -Patient reports improvement of pain with tramadol -Instructed to call office and schedule appointment to see Dr. Hulan Fray -Rx for Tramadol 50mg  Disp 6, RF 0 sent to pharmacy on file. -Encouraged to call or return to MAU if symptoms worsen or with the onset of new symptoms. -Discharged to home in stable condition  Sharon Conners MSN, CNM 01/22/2019, 3:54 PM

## 2019-01-22 NOTE — MAU Note (Signed)
When she sits, she is having severe pain in lower abd. Feels like something is being smooshed. Pain goes into her legs.  When she stretches out to sleep, has even more pain.  Been going on for the last month.  ( LAVH- Nov, 2018). Started acting up about a month ago. Still has ovaries.

## 2019-01-22 NOTE — Discharge Instructions (Signed)
Abdominal Pain, Adult  Abdominal pain can be caused by many things. Often, abdominal pain is not serious and it gets better with no treatment or by being treated at home. However, sometimes abdominal pain is serious. Your health care provider will do a medical history and a physical exam to try to determine the cause of your abdominal pain.  Follow these instructions at home:   Take over-the-counter and prescription medicines only as told by your health care provider. Do not take a laxative unless told by your health care provider.   Drink enough fluid to keep your urine clear or pale yellow.   Watch your condition for any changes.   Keep all follow-up visits as told by your health care provider. This is important.  Contact a health care provider if:   Your abdominal pain changes or gets worse.   You are not hungry or you lose weight without trying.   You are constipated or have diarrhea for more than 2-3 days.   You have pain when you urinate or have a bowel movement.   Your abdominal pain wakes you up at night.   Your pain gets worse with meals, after eating, or with certain foods.   You are throwing up and cannot keep anything down.   You have a fever.  Get help right away if:   Your pain does not go away as soon as your health care provider told you to expect.   You cannot stop throwing up.   Your pain is only in areas of the abdomen, such as the right side or the left lower portion of the abdomen.   You have bloody or black stools, or stools that look like tar.   You have severe pain, cramping, or bloating in your abdomen.   You have signs of dehydration, such as:  ? Dark urine, very little urine, or no urine.  ? Cracked lips.  ? Dry mouth.  ? Sunken eyes.  ? Sleepiness.  ? Weakness.  This information is not intended to replace advice given to you by your health care provider. Make sure you discuss any questions you have with your health care provider.  Document Released: 09/18/2005 Document  Revised: 06/28/2016 Document Reviewed: 05/22/2016  Elsevier Interactive Patient Education  2019 [employer]

## 2019-06-07 IMAGING — CR DG CHEST 2V
2 series · 2 of 2 positions shown · non-contrast
Comparison: Chest x-ray dated June 08, 2018.

CLINICAL DATA: Chest pain.

EXAM:
CHEST - 2 VIEW

[chest lat]
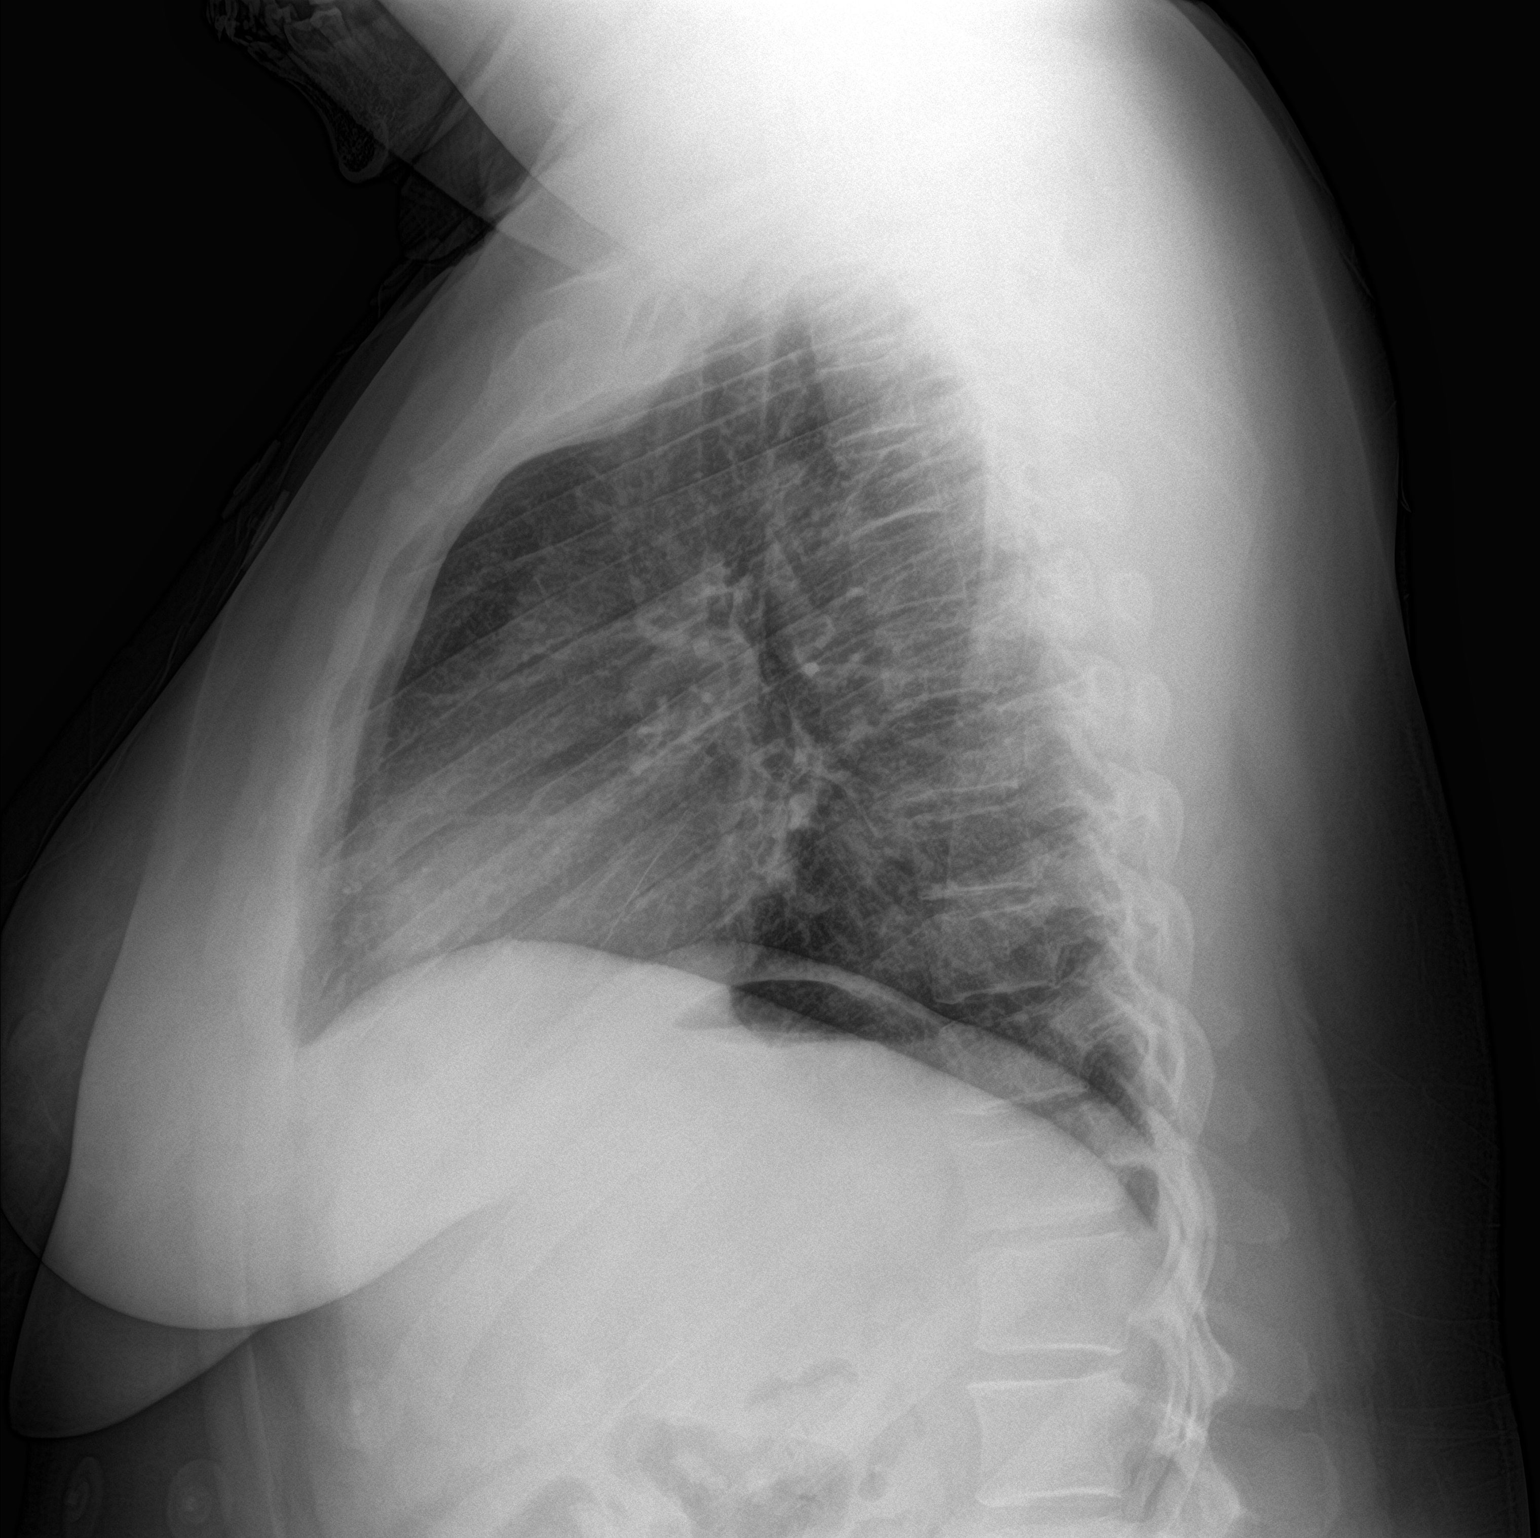

[chest ap]
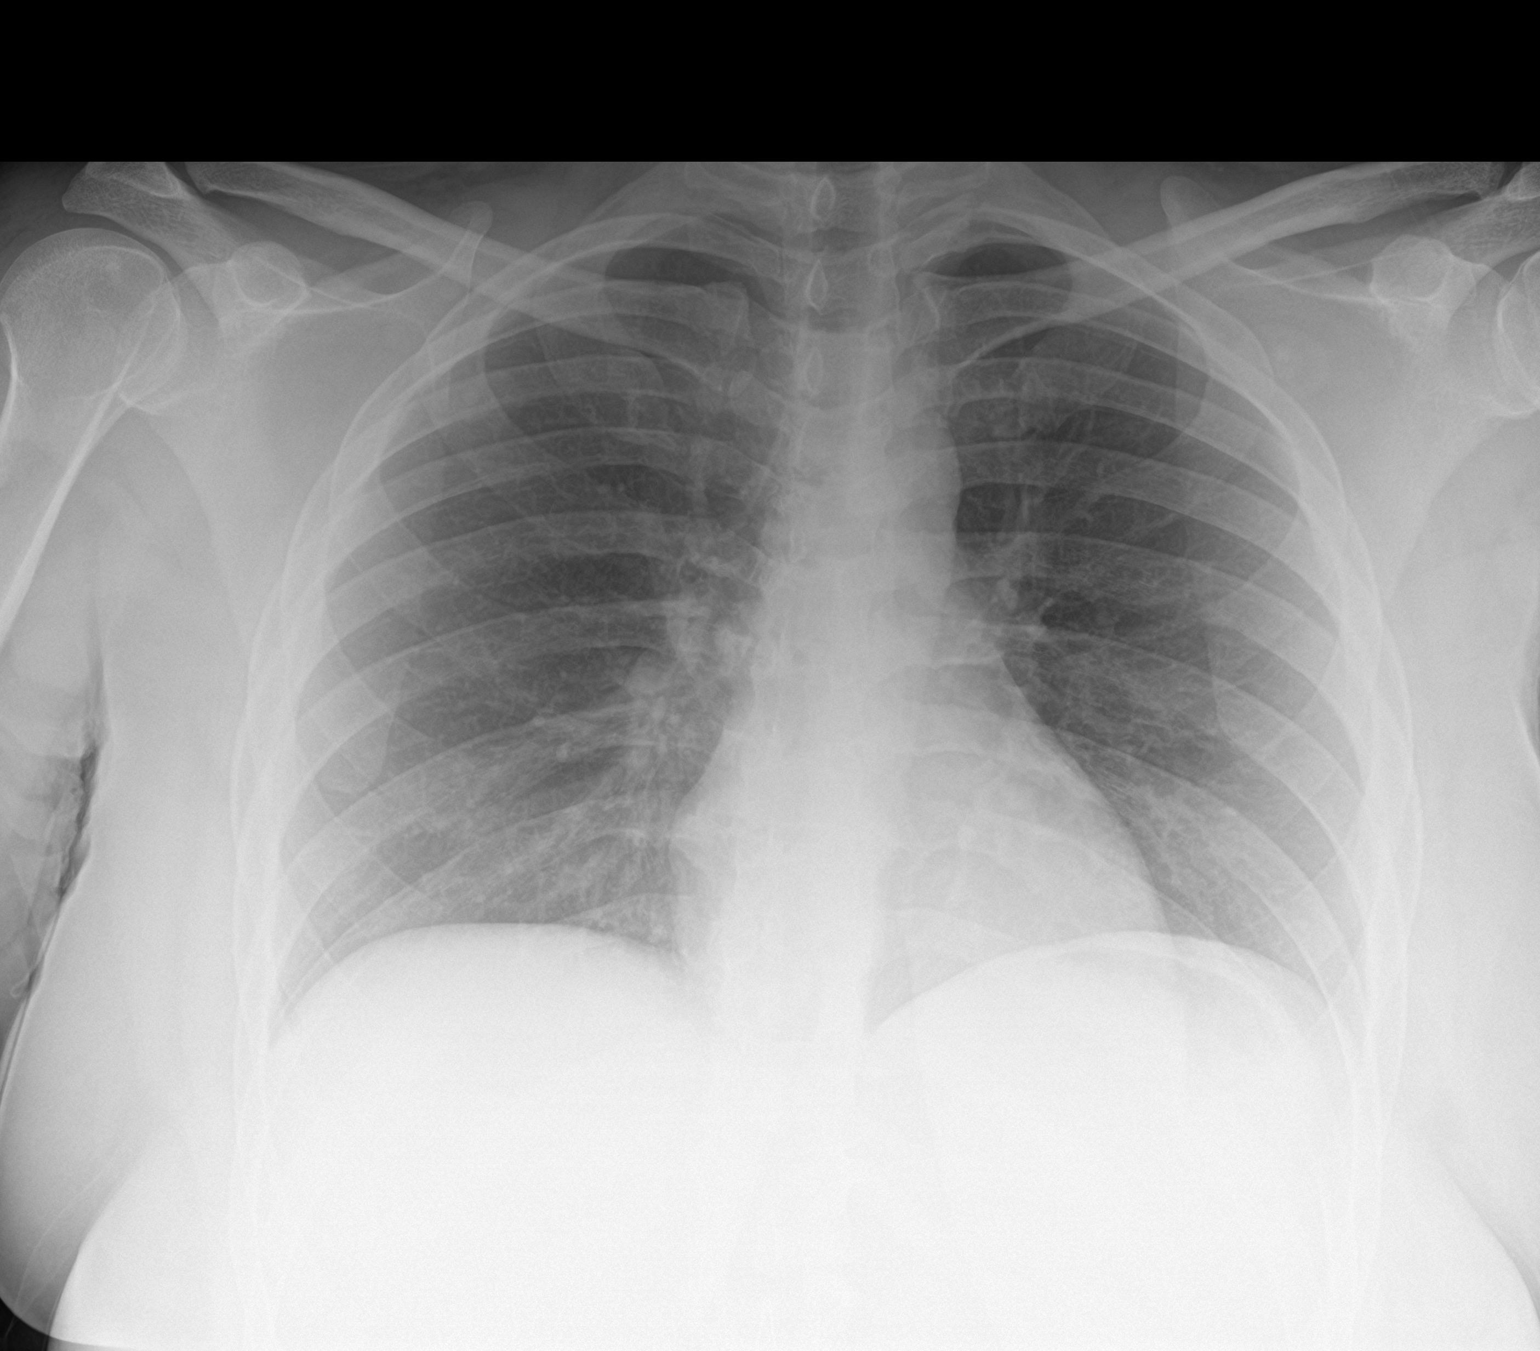

[2 of 2 positions shown; findings below may reference images not displayed]

FINDINGS: The heart size and mediastinal contours are within normal limits.
Both lungs are clear. The visualized skeletal structures are
unremarkable.
IMPRESSION: No active cardiopulmonary disease.

## 2019-09-07 DIAGNOSIS — E559 Vitamin D deficiency, unspecified: Secondary | ICD-10-CM | POA: Insufficient documentation

## 2019-11-09 ENCOUNTER — Other Ambulatory Visit: Payer: Self-pay

## 2019-11-09 ENCOUNTER — Encounter: Payer: Self-pay | Admitting: Emergency Medicine

## 2019-11-09 ENCOUNTER — Ambulatory Visit
Admission: EM | Admit: 2019-11-09 | Discharge: 2019-11-09 | Disposition: A | Discharge status: Home / Self Care | Payer: Medicaid Other | Attending: Physician Assistant | Admitting: Physician Assistant

## 2019-11-09 DIAGNOSIS — Z20828 Contact with and (suspected) exposure to other viral communicable diseases: Secondary | ICD-10-CM | POA: Diagnosis not present

## 2019-11-09 DIAGNOSIS — J4541 Moderate persistent asthma with (acute) exacerbation: Secondary | ICD-10-CM

## 2019-11-09 DIAGNOSIS — J069 Acute upper respiratory infection, unspecified: Secondary | ICD-10-CM

## 2019-11-09 DIAGNOSIS — Z20822 Contact with and (suspected) exposure to covid-19: Secondary | ICD-10-CM

## 2019-11-09 LAB — POC SARS CORONAVIRUS 2 AG -  ED: SARS Coronavirus 2 Ag: NEGATIVE

## 2019-11-09 MED ORDER — ALBUTEROL SULFATE HFA 108 (90 BASE) MCG/ACT IN AERS: 1.0000 | INHALATION_SPRAY | Freq: Four times a day (QID) | RESPIRATORY_TRACT | 0 refills | Status: AC | PRN

## 2019-11-09 MED ORDER — PREDNISONE 50 MG PO TABS: 50.0000 mg | ORAL_TABLET | Freq: Every day | ORAL | 0 refills | Status: DC

## 2019-11-09 NOTE — ED Provider Notes (Signed)
[facility]    CSN: [id number] Arrival date & time: 11/09/19  1931      History   Chief Complaint Chief Complaint  Patient presents with  . Shortness of Breath    HPI [name] is a 38 y.o. female.   38 year old female with history of asthma comes in for 3 to 4-day history of cough, positive Covid contacts 1 week ago.  States she has had intermittent shortness of breath for the past month, mostly with ambulation.  Has also felt shortness of breath when coughing or laughing hard.  States it feels like asthma exacerbation, and can be relieved by albuterol inhaler.  She denies chest pain, exertional fatigue during shortness of breath episodes.  Since symptom onset of cough, has had more frequent episodes of shortness of breath.  Denies rhinorrhea, nasal congestion, sore throat.  Subjective fever.  Body aches at baseline without worsening.  Denies chills.  Denies abdominal pain, nausea, diarrhea.  Has had few episodes of posttussive emesis.  Denies loss of taste or smell.     Past Medical History:  Diagnosis Date  . Anxiety   . Arthritis   . Asthma    meds as needed  . Bronchitis   . Chronic back pain   . Ectopic pregnancy    requiring surgery  . Elevated liver enzymes   . GERD (gastroesophageal reflux disease)   . Gonorrhea   . Hypertension   . Irritable bowel   . Migraine headache   . Peripheral vascular disease ([place])   . Pneumonia    2017  . Preterm labor   . Seizures ([place])    fever induced during childhood    Patient Active Problem List   Diagnosis Date Noted  . Lower abdominal pain 01/22/2019  . Post-operative state 11/18/2017  . Fibroid uterus 11/02/2015  . Abnormal uterine bleeding (AUB) 11/02/2015  . Asthma exacerbation 03/23/2015  . Tobacco abuse 03/23/2015  . History of migraine 03/23/2015  . Acute asthma exacerbation   . Cough   . Acute bronchitis   . Asthma 03/22/2015  . Migraine, unspecified, without mention of  intractable migraine without mention of status migrainosus 03/07/2014    Past Surgical History:  Procedure Laterality Date  . ABDOMINAL HYSTERECTOMY    . DILITATION & CURRETTAGE/HYSTROSCOPY WITH NOVASURE ABLATION N/A 02/20/2016   Procedure: HYSTEROSCOPY WITH NOVASURE ABLATION;  Surgeon: [name], MD;  Location: [facility];  Service: Gynecology;  Laterality: N/A;  . LAPAROSCOPIC VAGINAL HYSTERECTOMY WITH SALPINGECTOMY Left 11/18/2017   Procedure: LAPAROSCOPIC ASSISTED VAGINAL HYSTERECTOMY WITH SALPINGECTOMY;  Surgeon: Emily Filbert, MD;  Location: [facility];  Service: Gynecology;  Laterality: Left;  . LAPAROSCOPY  05/30/2012   Procedure: LAPAROSCOPY OPERATIVE;  Surgeon: Osborne Oman, MD;  Location: [facility];  Service: Gynecology;  Laterality: N/A;  Operative laparoscopy right salpingectomy and removal of ectopic pregnancy  . LAPAROSCOPY FOR ECTOPIC PREGNANCY    . SALPINGECTOMY    . TUBAL LIGATION  2005  . WISDOM TOOTH EXTRACTION      OB History    Gravida  5   Para  3   Term  2   Preterm  1   AB  2   Living  3     SAB  1   TAB      Ectopic  1   Multiple      Live Births               Home Medications  Prior to Admission medications   Medication Sig Start Date End Date Taking? Authorizing Provider  acetaminophen (TYLENOL) 650 MG CR tablet Take 1,300 mg by mouth every 8 (eight) hours as needed for pain.    [provider]  acyclovir (ZOVIRAX) 400 MG tablet Take 1 tablet (400 mg total) by mouth 3 (three) times daily. 08/06/18   Robyn Haber, MD  albuterol (VENTOLIN HFA) 108 (90 Base) MCG/ACT inhaler Inhale 1-2 puffs into the lungs every 6 (six) hours as needed for wheezing or shortness of breath. 11/09/19   Tasia Catchings, Bernarr Longsworth V, PA-C  cholecalciferol (VITAMIN D) 1000 units tablet Take 1,000 Units by mouth daily.    [provider]  diphenhydrAMINE (BENADRYL) 25 MG tablet Take 25 mg by mouth as needed for itching or allergies.    [provider]  hydrOXYzine (ATARAX/VISTARIL) 25 MG tablet Take 1 tablet (25 mg total) by mouth every 6 (six) hours. 08/08/18   Deno Etienne, DO  omeprazole (PRILOSEC) 20 MG capsule Take 1 capsule (20 mg total) by mouth daily. 07/05/18 08/04/18  Kinnie Feil, PA-C  predniSONE (DELTASONE) 50 MG tablet Take 1 tablet (50 mg total) by mouth daily with breakfast. 11/09/19   Ok Edwards, PA-C    Family History Family History  Problem Relation Age of Onset  . Asthma Brother   . Breast cancer Cousin     Social History Social History   Tobacco Use  . Smoking status: Current Every Day Smoker    Packs/day: 0.20    Years: 20.00    Pack years: 4.00    Types: Cigarettes    Last attempt to quit: 05/04/2016    Years since quitting: 3.5  . Smokeless tobacco: Never Used  Substance Use Topics  . Alcohol use: Yes    Alcohol/week: 1.0 standard drinks    Types: 1 Glasses of wine per week    Comment: occasional  . Drug use: No     Allergies   Latex, Raspberry, and Vioxx [rofecoxib]   Review of Systems Review of Systems  Reason unable to perform ROS: See HPI as above.     Physical Exam Triage Vital Signs ED Triage Vitals  Enc Vitals Group     BP 11/09/19 1945 (!) 149/88     Pulse Rate 11/09/19 1945 85     Resp 11/09/19 1945 18     Temp 11/09/19 1945 98.9 F (37.2 C)     Temp Source 11/09/19 1945 Temporal     SpO2 11/09/19 1945 98 %     Weight --      Height --      Head Circumference --      Peak Flow --      Pain Score 11/09/19 1950 8     Pain Loc --      Pain Edu? --      Excl. in [place]? --    No data found.  Updated Vital Signs BP (!) 149/88 (BP Location: Left Arm)   Pulse 85   Temp 98.9 F (37.2 C) (Temporal)   Resp 18   LMP 11/14/2017   SpO2 98%   Physical Exam Constitutional:      General: She is not in acute distress.    Appearance: Normal appearance. She is not ill-appearing, toxic-appearing or diaphoretic.  HENT:     Head: Normocephalic and atraumatic.      Mouth/Throat:     Mouth: Mucous membranes are moist.     Pharynx: Oropharynx is  clear. Uvula midline.  Neck:     Musculoskeletal: Normal range of motion and neck supple.  Cardiovascular:     Rate and Rhythm: Normal rate and regular rhythm.     Heart sounds: Normal heart sounds. No murmur. No friction rub. No gallop.   Pulmonary:     Effort: Pulmonary effort is normal. No accessory muscle usage, prolonged expiration, respiratory distress or retractions.     Comments: Speaking in full sentences without difficulty.  Lungs clear to auscultation without adventitious lung sounds. Neurological:     General: No focal deficit present.     Mental Status: She is alert and oriented to person, place, and time.      UC Treatments / Results  Labs (all labs ordered are listed, but only abnormal results are displayed) Labs Reviewed  POC SARS CORONAVIRUS 2 ED - Normal  NOVEL CORONAVIRUS, NAA    EKG   Radiology No results found.  Procedures Procedures (including critical care time)  Medications Ordered in UC Medications - No data to display  Initial Impression / Assessment and Plan / UC Course  I have reviewed the triage vital signs and the nursing notes.  Pertinent labs & imaging results that were available during my care of the patient were reviewed by me and considered in my medical decision making (see chart for details).    Rapid COVID negative. PCR test ordered.  However, given positive Covid contact, patient to quarantine for the next 14 days regardless of testing results.  No alarming signs on exam.  Patient speaking in full sentences without respiratory distress.  Will start patient on prednisone for possible bronchitis/asthma exacerbation.  Albuterol inhaler as needed.  Push fluids.  Return precautions given.    Final Clinical Impressions(s) / UC Diagnoses   Final diagnoses:  Exposure to COVID-19 virus  Viral URI  Moderate persistent asthma with exacerbation   ED  Prescriptions    Medication Sig Dispense Auth. Provider   albuterol (VENTOLIN HFA) 108 (90 Base) MCG/ACT inhaler Inhale 1-2 puffs into the lungs every 6 (six) hours as needed for wheezing or shortness of breath. 8 g Avalynne Diver V, PA-C   predniSONE (DELTASONE) 50 MG tablet Take 1 tablet (50 mg total) by mouth daily with breakfast. 5 tablet Ok Edwards, PA-C     PDMP not reviewed this encounter.   Ok Edwards, PA-C 11/09/19 2042

## 2019-11-09 NOTE — ED Notes (Signed)
Patient able to ambulate independently  

## 2019-11-09 NOTE — Discharge Instructions (Signed)
Rapid COVID negative. PCR testing ordered. As discussed, given your exposure, I would like you to quarantine for 14 days regardless of results. Start prednisone as directed for shortness of breath/cough. Albuterol as needed for shortness of breath/wheezing. You can take over the counter flonase/nasacort to help with nasal congestion/drainage. If experiencing worsening shortness of breath, trouble breathing, weakness, dizziness, chest pain/pressure go to the emergency department for further evaluation needed.

## 2019-11-09 NOTE — ED Triage Notes (Addendum)
Pt presents to [facility] for assessment of shortness of breath on and off for 1 month.  Pt states hx of bronchitis.  Patient states the last 3-4 days the cough has gotten worse.   Niece is positive for COVID Wednesday of last week, and her daughter was with the patient on Saturday.

## 2019-11-12 LAB — NOVEL CORONAVIRUS, NAA: SARS-CoV-2, NAA: NOT DETECTED

## 2019-11-21 ENCOUNTER — Encounter (HOSPITAL_COMMUNITY): Payer: Self-pay | Admitting: Emergency Medicine

## 2019-11-21 ENCOUNTER — Other Ambulatory Visit: Payer: Self-pay

## 2019-11-21 ENCOUNTER — Emergency Department (HOSPITAL_COMMUNITY): Payer: Medicaid Other

## 2019-11-21 ENCOUNTER — Emergency Department (HOSPITAL_COMMUNITY)
Admission: EM | Admit: 2019-11-21 | Discharge: 2019-11-21 | Disposition: A | Discharge status: Home / Self Care | Payer: Medicaid Other | Attending: Emergency Medicine | Admitting: Emergency Medicine

## 2019-11-21 ENCOUNTER — Emergency Department (HOSPITAL_COMMUNITY)
Admission: EM | Admit: 2019-11-21 | Discharge: 2019-11-21 | Disposition: A | Discharge status: Home / Self Care | Payer: Medicaid Other | Source: Home / Self Care

## 2019-11-21 DIAGNOSIS — Z5321 Procedure and treatment not carried out due to patient leaving prior to being seen by health care provider: Secondary | ICD-10-CM | POA: Insufficient documentation

## 2019-11-21 DIAGNOSIS — R0789 Other chest pain: Secondary | ICD-10-CM | POA: Insufficient documentation

## 2019-11-21 DIAGNOSIS — R0602 Shortness of breath: Secondary | ICD-10-CM | POA: Insufficient documentation

## 2019-11-21 DIAGNOSIS — R05 Cough: Secondary | ICD-10-CM | POA: Insufficient documentation

## 2019-11-21 NOTE — ED Triage Notes (Signed)
Pt having SOB hx of bronchitis and asthma. Pt was seen at urgent care on 17th for same symptoms due to family member having Covid. Pt's sats and vitals are normal. No chest pain

## 2019-11-21 NOTE — ED Notes (Signed)
Pt did not answer when called for vitals x2

## 2019-11-21 NOTE — ED Notes (Signed)
Pt did not respond when called for vitals check 

## 2019-11-21 NOTE — ED Notes (Addendum)
Pt states she needs to go home- states she wants to use her home neb treatment because the inhalers do not work. Pt encouraged to stay, continues to state that she is going to go home to use her own treatments. Pt ambulated through lobby with steady gait, in NAD, speaking in full sentences.

## 2019-11-21 NOTE — ED Triage Notes (Addendum)
Pt to ED with c/o cough and sob since 11/17   Pt was here yesterday but left before being seen  Pt c/o pain in her chest when she coughs

## 2020-07-10 ENCOUNTER — Encounter (HOSPITAL_COMMUNITY): Payer: Self-pay | Admitting: *Deleted

## 2020-07-10 ENCOUNTER — Emergency Department (HOSPITAL_COMMUNITY)
Admission: EM | Admit: 2020-07-10 | Discharge: 2020-07-10 | Disposition: A | Discharge status: Home / Self Care | Payer: Medicaid Other | Attending: Emergency Medicine | Admitting: Emergency Medicine

## 2020-07-10 ENCOUNTER — Other Ambulatory Visit: Payer: Self-pay

## 2020-07-10 ENCOUNTER — Emergency Department (HOSPITAL_COMMUNITY): Payer: Medicaid Other

## 2020-07-10 DIAGNOSIS — R079 Chest pain, unspecified: Secondary | ICD-10-CM | POA: Insufficient documentation

## 2020-07-10 DIAGNOSIS — Z5321 Procedure and treatment not carried out due to patient leaving prior to being seen by health care provider: Secondary | ICD-10-CM | POA: Insufficient documentation

## 2020-07-10 LAB — BASIC METABOLIC PANEL
Anion gap: 8 (ref 5–15)
BUN: 9 mg/dL (ref 6–20)
CO2: 23 mmol/L (ref 22–32)
Calcium: 8.6 mg/dL — ABNORMAL LOW (ref 8.9–10.3)
Chloride: 108 mmol/L (ref 98–111)
Creatinine, Ser: 0.7 mg/dL (ref 0.44–1.00)
GFR calc Af Amer: >60 mL/min (ref >60)
GFR calc non Af Amer: >60 mL/min (ref >60)
Glucose, Bld: 102 mg/dL — ABNORMAL HIGH (ref 70–99)
Potassium: 3.7 mmol/L (ref 3.5–5.1)
Sodium: 139 mmol/L (ref 135–145)

## 2020-07-10 LAB — CBC
HCT: 38.8 % (ref 36.0–46.0)
Hemoglobin: 12.5 g/dL (ref 12.0–15.0)
MCH: 30.6 pg (ref 26.0–34.0)
MCHC: 32.2 g/dL (ref 30.0–36.0)
MCV: 94.9 fL (ref 80.0–100.0)
Platelets: 234 10*3/uL (ref 150–400)
RBC: 4.09 MIL/uL (ref 3.87–5.11)
RDW: 12.4 % (ref 11.5–15.5)
WBC: 7.8 10*3/uL (ref 4.0–10.5)
nRBC: 0 % (ref 0.0–0.2)

## 2020-07-10 LAB — TROPONIN I (HIGH SENSITIVITY): Troponin I (High Sensitivity): 5 ng/L (ref <18)

## 2020-07-10 MED ORDER — SODIUM CHLORIDE 0.9% FLUSH: 3.0000 mL | Freq: Once | INTRAVENOUS | Status: DC

## 2020-07-10 NOTE — ED Notes (Signed)
Pt called or vitals with no response x3.

## 2020-07-10 NOTE — ED Triage Notes (Signed)
The pt   C/o rt sided chest pain  For 2 days  She stopped smoking 4 weeks ago  Not using the patches the pain is worse with inspiration nauseated and with movement  lmp none

## 2020-07-10 NOTE — ED Notes (Signed)
Pt called for vitals with no response.

## 2020-10-15 ENCOUNTER — Emergency Department (HOSPITAL_COMMUNITY): Payer: Medicaid Other

## 2020-10-15 ENCOUNTER — Other Ambulatory Visit: Payer: Self-pay

## 2020-10-15 ENCOUNTER — Emergency Department (HOSPITAL_COMMUNITY)
Admission: EM | Admit: 2020-10-15 | Discharge: 2020-10-15 | Disposition: A | Discharge status: Home / Self Care | Payer: Medicaid Other | Attending: Emergency Medicine | Admitting: Emergency Medicine

## 2020-10-15 ENCOUNTER — Encounter (HOSPITAL_COMMUNITY): Payer: Self-pay | Admitting: Emergency Medicine

## 2020-10-15 DIAGNOSIS — Z91040 Latex allergy status: Secondary | ICD-10-CM | POA: Insufficient documentation

## 2020-10-15 DIAGNOSIS — Z79899 Other long term (current) drug therapy: Secondary | ICD-10-CM | POA: Diagnosis not present

## 2020-10-15 DIAGNOSIS — F17210 Nicotine dependence, cigarettes, uncomplicated: Secondary | ICD-10-CM | POA: Insufficient documentation

## 2020-10-15 DIAGNOSIS — Z20822 Contact with and (suspected) exposure to covid-19: Secondary | ICD-10-CM | POA: Insufficient documentation

## 2020-10-15 DIAGNOSIS — I10 Essential (primary) hypertension: Secondary | ICD-10-CM | POA: Diagnosis not present

## 2020-10-15 DIAGNOSIS — J45901 Unspecified asthma with (acute) exacerbation: Secondary | ICD-10-CM | POA: Diagnosis not present

## 2020-10-15 DIAGNOSIS — K529 Noninfective gastroenteritis and colitis, unspecified: Secondary | ICD-10-CM | POA: Diagnosis not present

## 2020-10-15 DIAGNOSIS — R1084 Generalized abdominal pain: Secondary | ICD-10-CM

## 2020-10-15 DIAGNOSIS — Z8669 Personal history of other diseases of the nervous system and sense organs: Secondary | ICD-10-CM | POA: Insufficient documentation

## 2020-10-15 DIAGNOSIS — R109 Unspecified abdominal pain: Secondary | ICD-10-CM | POA: Diagnosis present

## 2020-10-15 LAB — URINALYSIS, ROUTINE W REFLEX MICROSCOPIC
Bilirubin Urine: NEGATIVE
Glucose, UA: NEGATIVE mg/dL
Hgb urine dipstick: NEGATIVE
Ketones, ur: NEGATIVE mg/dL
Leukocytes,Ua: NEGATIVE
Nitrite: NEGATIVE
Protein, ur: NEGATIVE mg/dL
Specific Gravity, Urine: 1.023 (ref 1.005–1.030)
pH: 5 (ref 5.0–8.0)

## 2020-10-15 LAB — COMPREHENSIVE METABOLIC PANEL
ALT: 28 U/L (ref 0–44)
AST: 23 U/L (ref 15–41)
Albumin: 4 g/dL (ref 3.5–5.0)
Alkaline Phosphatase: 105 U/L (ref 38–126)
Anion gap: 10 (ref 5–15)
BUN: 8 mg/dL (ref 6–20)
CO2: 19 mmol/L — ABNORMAL LOW (ref 22–32)
Calcium: 9.5 mg/dL (ref 8.9–10.3)
Chloride: 108 mmol/L (ref 98–111)
Creatinine, Ser: 0.73 mg/dL (ref 0.44–1.00)
GFR, Estimated: >60 mL/min (ref >60)
Glucose, Bld: 117 mg/dL — ABNORMAL HIGH (ref 70–99)
Potassium: 3.6 mmol/L (ref 3.5–5.1)
Sodium: 137 mmol/L (ref 135–145)
Total Bilirubin: 0.4 mg/dL (ref 0.3–1.2)
Total Protein: 7.9 g/dL (ref 6.5–8.1)

## 2020-10-15 LAB — RESPIRATORY PANEL BY RT PCR (FLU A&B, COVID)
Influenza A by PCR: NEGATIVE
Influenza B by PCR: NEGATIVE
SARS Coronavirus 2 by RT PCR: NEGATIVE

## 2020-10-15 LAB — CBC
HCT: 44 % (ref 36.0–46.0)
Hemoglobin: 14.4 g/dL (ref 12.0–15.0)
MCH: 29.8 pg (ref 26.0–34.0)
MCHC: 32.7 g/dL (ref 30.0–36.0)
MCV: 90.9 fL (ref 80.0–100.0)
Platelets: 376 10*3/uL (ref 150–400)
RBC: 4.84 MIL/uL (ref 3.87–5.11)
RDW: 12.2 % (ref 11.5–15.5)
WBC: 16.8 10*3/uL — ABNORMAL HIGH (ref 4.0–10.5)
nRBC: 0 % (ref 0.0–0.2)

## 2020-10-15 LAB — I-STAT BETA HCG BLOOD, ED (MC, WL, AP ONLY): I-stat hCG, quantitative: <5 m[IU]/mL (ref <5)

## 2020-10-15 LAB — LIPASE, BLOOD: Lipase: 30 U/L (ref 11–51)

## 2020-10-15 MED ORDER — FENTANYL CITRATE (PF) 100 MCG/2ML IJ SOLN
50.0000 ug | Freq: Once | INTRAMUSCULAR | Status: AC
  Administered 2020-10-15: 50 ug via INTRAVENOUS
  Filled 2020-10-15: qty 2

## 2020-10-15 MED ORDER — IOHEXOL 300 MG/ML  SOLN
100.0000 mL | Freq: Once | INTRAMUSCULAR | Status: AC | PRN
  Administered 2020-10-15: 100 mL via INTRAVENOUS

## 2020-10-15 MED ORDER — DICYCLOMINE HCL 20 MG PO TABS
20.0000 mg | ORAL_TABLET | Freq: Two times a day (BID) | ORAL | 0 refills | Status: DC
Start: 2020-10-15 — End: 2022-07-31

## 2020-10-15 MED ORDER — ONDANSETRON 4 MG PO TBDP
4.0000 mg | ORAL_TABLET | Freq: Three times a day (TID) | ORAL | 0 refills | Status: DC | PRN
Start: 2020-10-15 — End: 2021-11-23

## 2020-10-15 MED ORDER — ONDANSETRON HCL 4 MG/2ML IJ SOLN
4.0000 mg | Freq: Once | INTRAMUSCULAR | Status: AC
  Administered 2020-10-15: 4 mg via INTRAVENOUS
  Filled 2020-10-15: qty 2

## 2020-10-15 MED ORDER — MORPHINE SULFATE (PF) 4 MG/ML IV SOLN
4.0000 mg | Freq: Once | INTRAVENOUS | Status: AC
  Administered 2020-10-15: 4 mg via INTRAVENOUS
  Filled 2020-10-15: qty 1

## 2020-10-15 MED ORDER — LACTATED RINGERS IV BOLUS
500.0000 mL | Freq: Once | INTRAVENOUS | Status: AC
  Administered 2020-10-15: 500 mL via INTRAVENOUS

## 2020-10-15 NOTE — ED Provider Notes (Signed)
[facility] EMERGENCY DEPARTMENT Provider Note   CSN: [id number] Arrival date & time: 10/15/20  1316     History Chief Complaint  Patient presents with  . Abdominal Pain    [name] is a 39 y.o. female history of asthma, chronic pain, GERD, hypertension, PVD, hysterectomy, ectopic pregnancies.  Patient presents today with sudden onset abdominal pain which began around an hour before arrival, periumbilical nonradiating constant aching associated with multiple episodes of nonbloody nonbilious emesis and nonbloody diarrhea.  Denies similar pain in the past.  Denies fever/chills, fall/injury, chest pain/shortness of breath, sore throat, headache, dysuria/hematuria, vaginal bleeding/discharge, concern for STI, numbness/tingling, weakness, sick contacts or any additional concerns. HPI     Past Medical History:  Diagnosis Date  . Anxiety   . Arthritis   . Asthma    meds as needed  . Bronchitis   . Chronic back pain   . Ectopic pregnancy    requiring surgery  . Elevated liver enzymes   . GERD (gastroesophageal reflux disease)   . Gonorrhea   . Hypertension   . Irritable bowel   . Migraine headache   . Peripheral vascular disease ([place])   . Pneumonia    2017  . Preterm labor   . Seizures ([place])    fever induced during childhood    Patient Active Problem List   Diagnosis Date Noted  . Lower abdominal pain 01/22/2019  . Post-operative state 11/18/2017  . Fibroid uterus 11/02/2015  . Abnormal uterine bleeding (AUB) 11/02/2015  . Asthma exacerbation 03/23/2015  . Tobacco abuse 03/23/2015  . History of migraine 03/23/2015  . Acute asthma exacerbation   . Cough   . Acute bronchitis   . Asthma 03/22/2015  . Migraine, unspecified, without mention of intractable migraine without mention of status migrainosus 03/07/2014    Past Surgical History:  Procedure Laterality Date  . ABDOMINAL HYSTERECTOMY    . DILITATION & CURRETTAGE/HYSTROSCOPY  WITH NOVASURE ABLATION N/A 02/20/2016   Procedure: HYSTEROSCOPY WITH NOVASURE ABLATION;  Surgeon: [name], MD;  Location: [facility];  Service: Gynecology;  Laterality: N/A;  . LAPAROSCOPIC VAGINAL HYSTERECTOMY WITH SALPINGECTOMY Left 11/18/2017   Procedure: LAPAROSCOPIC ASSISTED VAGINAL HYSTERECTOMY WITH SALPINGECTOMY;  Surgeon: Emily Filbert, MD;  Location: [facility];  Service: Gynecology;  Laterality: Left;  . LAPAROSCOPY  05/30/2012   Procedure: LAPAROSCOPY OPERATIVE;  Surgeon: Osborne Oman, MD;  Location: [facility];  Service: Gynecology;  Laterality: N/A;  Operative laparoscopy right salpingectomy and removal of ectopic pregnancy  . LAPAROSCOPY FOR ECTOPIC PREGNANCY    . SALPINGECTOMY    . TUBAL LIGATION  2005  . WISDOM TOOTH EXTRACTION       OB History    Gravida  5   Para  3   Term  2   Preterm  1   AB  2   Living  3     SAB  1   TAB      Ectopic  1   Multiple      Live Births              Family History  Problem Relation Age of Onset  . Asthma Brother   . Breast cancer Cousin     Social History   Tobacco Use  . Smoking status: Current Every Day Smoker    Packs/day: 0.20    Years: 20.00    Pack years: 4.00    Types: Cigarettes    Last attempt to quit: 05/04/2016  Years since quitting: 4.4  . Smokeless tobacco: Never Used  Vaping Use  . Vaping Use: Some days  Substance Use Topics  . Alcohol use: Yes    Alcohol/week: 1.0 standard drink    Types: 1 Glasses of wine per week    Comment: occasional  . Drug use: No    Home Medications Prior to Admission medications   Medication Sig Start Date End Date Taking? Authorizing Provider  acetaminophen (TYLENOL) 650 MG CR tablet Take 1,300 mg by mouth every 8 (eight) hours as needed for pain.    [provider]  acyclovir (ZOVIRAX) 400 MG tablet Take 1 tablet (400 mg total) by mouth 3 (three) times daily. 08/06/18   Robyn Haber, MD  albuterol (VENTOLIN HFA) 108 (90 Base) MCG/ACT  inhaler Inhale 1-2 puffs into the lungs every 6 (six) hours as needed for wheezing or shortness of breath. 11/09/19   Tasia Catchings, Amy V, PA-C  cholecalciferol (VITAMIN D) 1000 units tablet Take 1,000 Units by mouth daily.    [provider]  diphenhydrAMINE (BENADRYL) 25 MG tablet Take 25 mg by mouth as needed for itching or allergies.    [provider]  hydrOXYzine (ATARAX/VISTARIL) 25 MG tablet Take 1 tablet (25 mg total) by mouth every 6 (six) hours. 08/08/18   Deno Etienne, DO  omeprazole (PRILOSEC) 20 MG capsule Take 1 capsule (20 mg total) by mouth daily. 07/05/18 08/04/18  Kinnie Feil, PA-C  predniSONE (DELTASONE) 50 MG tablet Take 1 tablet (50 mg total) by mouth daily with breakfast. 11/09/19   Tasia Catchings, Amy V, PA-C    Allergies    Latex, Raspberry, and Vioxx [rofecoxib]  Review of Systems   Review of Systems Ten systems are reviewed and are negative for acute change except as noted in the HPI  Physical Exam Updated Vital Signs BP (!) 146/125 (BP Location: Left Arm)   Pulse 93   Temp 98.3 F (36.8 C) (Oral)   Resp (!) 22   LMP 11/14/2017   SpO2 96%   Physical Exam Constitutional:      General: She is not in acute distress.    Appearance: Normal appearance. She is well-developed. She is not ill-appearing or diaphoretic.  HENT:     Head: Normocephalic and atraumatic.  Eyes:     General: Vision grossly intact. Gaze aligned appropriately.     Pupils: Pupils are equal, round, and reactive to light.  Neck:     Trachea: Trachea and phonation normal.  Pulmonary:     Effort: Pulmonary effort is normal. No respiratory distress.  Abdominal:     General: There is no distension.     Palpations: Abdomen is soft.     Tenderness: There is generalized abdominal tenderness and tenderness in the periumbilical area. There is guarding. There is no rebound.  Genitourinary:    Comments: Pelvic exam refused by patient Musculoskeletal:        General: Normal range of motion.      Cervical back: Normal range of motion.  Skin:    General: Skin is warm and dry.  Neurological:     Mental Status: She is alert.     GCS: GCS eye subscore is 4. GCS verbal subscore is 5. GCS motor subscore is 6.     Comments: Speech is clear and goal oriented, follows commands Major Cranial nerves without deficit, no facial droop Moves extremities without ataxia, coordination intact  Psychiatric:        Behavior: Behavior normal.  ED Results / Procedures / Treatments   Labs (all labs ordered are listed, but only abnormal results are displayed) Labs Reviewed  LIPASE, BLOOD  COMPREHENSIVE METABOLIC PANEL  CBC  URINALYSIS, ROUTINE W REFLEX MICROSCOPIC  I-STAT BETA HCG BLOOD, ED ([facility], [facility], [facility] ONLY)    EKG None  Radiology No results found.  Procedures Procedures (including critical care time)  Medications Ordered in ED Medications - No data to display  ED Course  I have reviewed the triage vital signs and the nursing notes.  Pertinent labs & imaging results that were available during my care of the patient were reviewed by me and considered in my medical decision making (see chart for details).    MDM Rules/Calculators/A&P                         Additional history obtained from: 1. Nursing notes from this visit. 2. Review of electronic medical record system. -------------------------------------- I ordered, reviewed and interpreted labs which include: Beta-hCG negative, doubt ectopic Covid/influenza panel negative Lipase within normal limits doubt pancreatitis CMP without emergent electrolyte derangement, AKI, LFT elevations or gap. CBC shows a ketosis of 16.8, no anemia. Urinalysis without evidence of infection. ---- Care handoff given to Providence Lanius PA-C at shift change, plan of care is to follow-up on pending CT abdomen pelvis.  Disposition per oncoming team.   Note: Portions of this report may have been transcribed using voice recognition software. Every  effort was made to ensure accuracy; however, inadvertent computerized transcription errors may still be present. Final Clinical Impression(s) / ED Diagnoses Final diagnoses:  None    Rx / DC Orders ED Discharge Orders    None       Gari Crown 10/15/20 1528    Lucrezia Starch, MD 10/16/20 [phone]

## 2020-10-15 NOTE — ED Provider Notes (Signed)
Care assumed from [facility] [place] at shift change with CT abd/pelvis pending.   In brief, this patient is a 39 y.o. F presents her of asthma, chronic pain, GERD, hypertension who presents for evaluation of abdominal pain that began about 1 hour prior to ED arrival.  She reports that pain is periumbilical and does not radiate.  She describes it as a constant aching.  She has had multiple episodes of nausea/vomiting/diarrhea.  No fevers.  Please see note from previous provider for full history/physical exam. abd pain    Physical Exam  BP 120/73 (BP Location: Right Arm)   Pulse 73   Temp 98.6 F (37 C) (Oral)   Resp 18   Ht 5\' 4"  (1.626 m)   Wt 85.7 kg   LMP 11/14/2017   SpO2 100%   BMI 32.43 kg/m   Physical Exam   Abdomen is soft, nondistended.  Mild periumbilical tenderness.  She has some mild diffuse tenderness noted to the generalized abdomen.  No focal point noted at McBurney's point.  ED Course/Procedures     Procedures   Results for orders placed or performed during the hospital encounter of 10/15/20 (from the past 24 hour(s))  Lipase, blood     Status: None   Collection Time: 10/15/20  1:33 PM  Result Value Ref Range   Lipase 30 11 - 51 U/L  Comprehensive metabolic panel     Status: Abnormal   Collection Time: 10/15/20  1:33 PM  Result Value Ref Range   Sodium 137 135 - 145 mmol/L   Potassium 3.6 3.5 - 5.1 mmol/L   Chloride 108 98 - 111 mmol/L   CO2 19 (L) 22 - 32 mmol/L   Glucose, Bld 117 (H) 70 - 99 mg/dL   BUN 8 6 - 20 mg/dL   Creatinine, Ser 0.73 0.44 - 1.00 mg/dL   Calcium 9.5 8.9 - 10.3 mg/dL   Total Protein 7.9 6.5 - 8.1 g/dL   Albumin 4.0 3.5 - 5.0 g/dL   AST 23 15 - 41 U/L   ALT 28 0 - 44 U/L   Alkaline Phosphatase 105 38 - 126 U/L   Total Bilirubin 0.4 0.3 - 1.2 mg/dL   GFR, Estimated >60 >60 mL/min   Anion gap 10 5 - 15  CBC     Status: Abnormal   Collection Time: 10/15/20  1:33 PM  Result Value Ref Range   WBC 16.8 (H) 4.0 - 10.5 K/uL    RBC 4.84 3.87 - 5.11 MIL/uL   Hemoglobin 14.4 12.0 - 15.0 g/dL   HCT 44.0 36 - 46 %   MCV 90.9 80.0 - 100.0 fL   MCH 29.8 26.0 - 34.0 pg   MCHC 32.7 30.0 - 36.0 g/dL   RDW 12.2 11.5 - 15.5 %   Platelets 376 150 - 400 K/uL   nRBC 0.0 0.0 - 0.2 %  Respiratory Panel by RT PCR (Flu A&B, Covid) - Nasopharyngeal Swab     Status: None   Collection Time: 10/15/20  1:36 PM   Specimen: Nasopharyngeal Swab  Result Value Ref Range   SARS Coronavirus 2 by RT PCR NEGATIVE NEGATIVE   Influenza A by PCR NEGATIVE NEGATIVE   Influenza B by PCR NEGATIVE NEGATIVE  I-Stat beta hCG blood, ED     Status: None   Collection Time: 10/15/20  2:09 PM  Result Value Ref Range   I-stat hCG, quantitative <5.0 <5 mIU/mL   Comment 3  Urinalysis, Routine w reflex microscopic     Status: Abnormal   Collection Time: 10/15/20  2:45 PM  Result Value Ref Range   Color, Urine YELLOW YELLOW   APPearance HAZY (A) CLEAR   Specific Gravity, Urine 1.023 1.005 - 1.030   pH 5.0 5.0 - 8.0   Glucose, UA NEGATIVE NEGATIVE mg/dL   Hgb urine dipstick NEGATIVE NEGATIVE   Bilirubin Urine NEGATIVE NEGATIVE   Ketones, ur NEGATIVE NEGATIVE mg/dL   Protein, ur NEGATIVE NEGATIVE mg/dL   Nitrite NEGATIVE NEGATIVE   Leukocytes,Ua NEGATIVE NEGATIVE    MDM    PLAN: Patient pending CT and pelvis.  MDM:  UA negative for any infection etiology.  I-STAT beta negative.  CMP shows normal BUN and creatinine.  LFTs within normal limits.  Lipase normal.  CBC shows slight leukocytosis of 16.8.  CT abd pelvis shows mild wall thickening of multiple small bowel loops, primarily within the pelvis, likely enteritis.  No evidence of bowel obstruction, pneumoperitoneum or abscess. No adnexal masses noted.   Patient requested some additional analgesics.    Reevaluation.  Patient reports improvement in pain.  On my abdominal exam, she has some very mild tenderness.  She states it feels better.  She has no focal tenderness in McBurney's  point that would make me suspicious for appendicitis.  I discussed her results with patient.  She states she had been diagnosed with an infection in her intestines about a week and a half ago.  She states she follows with [facility] and they were the ones who told her that.  She has not been on any medications.  I discussed with her findings of enteritis.  We will send her home with Bentyl and Zofran.  Instructed patient to follow-up with [facility]. At this time, patient exhibits no emergent life-threatening condition that require further evaluation in ED. Patient had ample opportunity for questions and discussion. All patient's questions were answered with full understanding. Strict return precautions discussed. Patient expresses understanding and agreement to plan.   1. Generalized abdominal pain   2. Enteritis      Portions of this note were generated with Dragon dictation software. Dictation errors may occur despite best attempts at proofreading.   Volanda Napoleon, PA-C 10/15/20 1736    Tegeler, Gwenyth Allegra, MD 10/16/20 [phone]

## 2020-10-15 NOTE — Discharge Instructions (Signed)
Take Bentyl as directed.  Take Zofran for nausea/vomiting.  As we discussed, your work-up here showed that you have a white blood cell count.  Your CT scan showed evidence of enteritis.  Please follow-up with your primary care doctor.  Return the emergency department for any worsening pain, fever, persistent vomiting or any other worsening concerning symptoms.

## 2020-10-15 NOTE — ED Notes (Signed)
Pt ambulated with no assistance to the bathroom to provide a UA sample. Pt back in bed. Family at bedside.

## 2020-10-15 NOTE — ED Triage Notes (Signed)
C/o severe mid abd pain since this morning with nausea, vomiting, and diarrhea.

## 2021-05-21 ENCOUNTER — Emergency Department (HOSPITAL_COMMUNITY)
Admission: EM | Admit: 2021-05-21 | Discharge: 2021-05-21 | Disposition: A | Discharge status: Home / Self Care | Payer: Medicaid Other | Attending: Emergency Medicine | Admitting: Emergency Medicine

## 2021-05-21 ENCOUNTER — Other Ambulatory Visit: Payer: Self-pay

## 2021-05-21 ENCOUNTER — Emergency Department (HOSPITAL_COMMUNITY): Payer: Medicaid Other

## 2021-05-21 DIAGNOSIS — J45909 Unspecified asthma, uncomplicated: Secondary | ICD-10-CM | POA: Insufficient documentation

## 2021-05-21 DIAGNOSIS — F17210 Nicotine dependence, cigarettes, uncomplicated: Secondary | ICD-10-CM | POA: Insufficient documentation

## 2021-05-21 DIAGNOSIS — I10 Essential (primary) hypertension: Secondary | ICD-10-CM | POA: Diagnosis not present

## 2021-05-21 DIAGNOSIS — R0789 Other chest pain: Secondary | ICD-10-CM | POA: Diagnosis not present

## 2021-05-21 DIAGNOSIS — Z91040 Latex allergy status: Secondary | ICD-10-CM | POA: Insufficient documentation

## 2021-05-21 DIAGNOSIS — G43809 Other migraine, not intractable, without status migrainosus: Secondary | ICD-10-CM | POA: Diagnosis not present

## 2021-05-21 DIAGNOSIS — G43109 Migraine with aura, not intractable, without status migrainosus: Secondary | ICD-10-CM | POA: Diagnosis not present

## 2021-05-21 LAB — DIFFERENTIAL
Abs Immature Granulocytes: 0.02 10*3/uL (ref 0.00–0.07)
Basophils Absolute: 0.1 10*3/uL (ref 0.0–0.1)
Basophils Relative: 1 %
Eosinophils Absolute: 0.3 10*3/uL (ref 0.0–0.5)
Eosinophils Relative: 3 %
Immature Granulocytes: 0 %
Lymphocytes Relative: 45 %
Lymphs Abs: 3.8 10*3/uL (ref 0.7–4.0)
Monocytes Absolute: 0.7 10*3/uL (ref 0.1–1.0)
Monocytes Relative: 9 %
Neutro Abs: 3.5 10*3/uL (ref 1.7–7.7)
Neutrophils Relative %: 42 %

## 2021-05-21 LAB — CBC
HCT: 39.2 % (ref 36.0–46.0)
Hemoglobin: 13.1 g/dL (ref 12.0–15.0)
MCH: 30.2 pg (ref 26.0–34.0)
MCHC: 33.4 g/dL (ref 30.0–36.0)
MCV: 90.3 fL (ref 80.0–100.0)
Platelets: 283 10*3/uL (ref 150–400)
RBC: 4.34 MIL/uL (ref 3.87–5.11)
RDW: 12.8 % (ref 11.5–15.5)
WBC: 8.4 10*3/uL (ref 4.0–10.5)
nRBC: 0 % (ref 0.0–0.2)

## 2021-05-21 LAB — COMPREHENSIVE METABOLIC PANEL
ALT: 17 U/L (ref 0–44)
AST: 18 U/L (ref 15–41)
Albumin: 3.7 g/dL (ref 3.5–5.0)
Alkaline Phosphatase: 116 U/L (ref 38–126)
Anion gap: 6 (ref 5–15)
BUN: 8 mg/dL (ref 6–20)
CO2: 24 mmol/L (ref 22–32)
Calcium: 8.8 mg/dL — ABNORMAL LOW (ref 8.9–10.3)
Chloride: 108 mmol/L (ref 98–111)
Creatinine, Ser: 0.51 mg/dL (ref 0.44–1.00)
GFR, Estimated: >60 mL/min (ref >60)
Glucose, Bld: 143 mg/dL — ABNORMAL HIGH (ref 70–99)
Potassium: 3.5 mmol/L (ref 3.5–5.1)
Sodium: 138 mmol/L (ref 135–145)
Total Bilirubin: 0.3 mg/dL (ref 0.3–1.2)
Total Protein: 7 g/dL (ref 6.5–8.1)

## 2021-05-21 LAB — I-STAT CHEM 8, ED
BUN: 5 mg/dL — ABNORMAL LOW (ref 6–20)
Calcium, Ion: 1.19 mmol/L (ref 1.15–1.40)
Chloride: 105 mmol/L (ref 98–111)
Creatinine, Ser: 0.6 mg/dL (ref 0.44–1.00)
Glucose, Bld: 146 mg/dL — ABNORMAL HIGH (ref 70–99)
HCT: 36 % (ref 36.0–46.0)
Hemoglobin: 12.2 g/dL (ref 12.0–15.0)
Potassium: 3.6 mmol/L (ref 3.5–5.1)
Sodium: 141 mmol/L (ref 135–145)
TCO2: 23 mmol/L (ref 22–32)

## 2021-05-21 LAB — PROTIME-INR
INR: 0.9 (ref 0.8–1.2)
Prothrombin Time: 12.5 seconds (ref 11.4–15.2)

## 2021-05-21 LAB — TROPONIN I (HIGH SENSITIVITY)
Troponin I (High Sensitivity): 3 ng/L (ref <18)
Troponin I (High Sensitivity): 3 ng/L (ref <18)

## 2021-05-21 LAB — APTT: aPTT: 28 seconds (ref 24–36)

## 2021-05-21 LAB — CBG MONITORING, ED: Glucose-Capillary: 144 mg/dL — ABNORMAL HIGH (ref 70–99)

## 2021-05-21 LAB — I-STAT BETA HCG BLOOD, ED (MC, WL, AP ONLY): I-stat hCG, quantitative: <5 m[IU]/mL (ref <5)

## 2021-05-21 MED ORDER — SODIUM CHLORIDE 0.9% FLUSH
3.0000 mL | Freq: Once | INTRAVENOUS | Status: AC
  Administered 2021-05-21: 3 mL via INTRAVENOUS

## 2021-05-21 MED ORDER — ALUM & MAG HYDROXIDE-SIMETH 200-200-20 MG/5ML PO SUSP
30.0000 mL | Freq: Once | ORAL | Status: AC
  Administered 2021-05-21: 30 mL via ORAL
  Filled 2021-05-21: qty 30

## 2021-05-21 MED ORDER — DIPHENHYDRAMINE HCL 50 MG/ML IJ SOLN
25.0000 mg | Freq: Once | INTRAMUSCULAR | Status: AC
  Administered 2021-05-21: 25 mg via INTRAVENOUS
  Filled 2021-05-21: qty 1

## 2021-05-21 MED ORDER — METOCLOPRAMIDE HCL 5 MG/ML IJ SOLN
10.0000 mg | Freq: Once | INTRAMUSCULAR | Status: AC
  Administered 2021-05-21: 10 mg via INTRAVENOUS
  Filled 2021-05-21: qty 2

## 2021-05-21 MED ORDER — LIDOCAINE VISCOUS HCL 2 % MT SOLN
15.0000 mL | Freq: Once | OROMUCOSAL | Status: AC
  Administered 2021-05-21: 15 mL via ORAL
  Filled 2021-05-21: qty 15

## 2021-05-21 NOTE — ED Notes (Signed)
Patient transported to MRI 

## 2021-05-21 NOTE — Consult Note (Signed)
[facility] Telemedicine Consult   Requesting Provider: Dr. Ronnald Nian Consult Participants: Dr. Jerelyn Charles, Telespecialist RN-family, bedside RN-Danielle Location of the provider: [facility] Location of the patient: [name] emergency room bed 17  This consult was provided via telemedicine with 2-way video and audio communication. The patient/family was informed that care would be provided in this way and agreed to receive care in this manner.    Chief Complaint: Slurred speech, facial droop, chest pain, right-sided numbness  HPI: 40 year old with a past medical history of anxiety, asthma, gastroesophageal reflux, hypertension, migraines, peripheral vascular disease, febrile seizures as a child, presenting to the emergency room with last known well of 2 PM when she started sudden onset of chest discomfort that she felt like this was either her reflux or an asthma attack without the shortness of breath.  Shortly after that she started noticing some right-sided eye drooping and facial drooping-angle of the mouth being drawn down along with soft speech.  The husband reports that her speech was not slurred but it was rather hypophonic based on his description.  She was brought into the emergency room for emergent evaluation and was noted to have slurred speech and some right-sided weakness which then had started to improve and she only had some residual right-sided numbness.  The symptoms started to resolve pretty rapidly upon ER arrival.  Code stroke was activated due to sudden onset of symptoms and patient being in the window for IV tPA. Patient was able to provide all history.  Her husband was at the bedside also to confirm and provide the history.  She had been following up with [facility] neurology for many years for migraine headaches.  She does not report a severe migraine headache today but reports that there is some head discomfort which actually started right after her symptoms.   She is not on any headache medication at this time.  She was seen via the camera.   Past Medical History:  Diagnosis Date  . Anxiety   . Arthritis   . Asthma    meds as needed  . Bronchitis   . Chronic back pain   . Ectopic pregnancy    requiring surgery  . Elevated liver enzymes   . GERD (gastroesophageal reflux disease)   . Gonorrhea   . Hypertension   . Irritable bowel   . Migraine headache   . Peripheral vascular disease ([place])   . Pneumonia    2017  . Preterm labor   . Seizures ([place])    fever induced during childhood    No current facility-administered medications for this encounter.  Current Outpatient Medications:  .  omeprazole (PRILOSEC) 20 MG capsule, Take 1 capsule (20 mg total) by mouth daily., Disp: 30 capsule, Rfl: 0 .  acetaminophen (TYLENOL) 650 MG CR tablet, Take 1,300 mg by mouth every 8 (eight) hours as needed for pain., Disp: , Rfl:  .  acyclovir (ZOVIRAX) 400 MG tablet, Take 1 tablet (400 mg total) by mouth 3 (three) times daily., Disp: 90 tablet, Rfl: 5 .  albuterol (VENTOLIN HFA) 108 (90 Base) MCG/ACT inhaler, Inhale 1-2 puffs into the lungs every 6 (six) hours as needed for wheezing or shortness of breath., Disp: 8 g, Rfl: 0 .  cholecalciferol (VITAMIN D) 1000 units tablet, Take 1,000 Units by mouth daily., Disp: , Rfl:  .  dicyclomine (BENTYL) 20 MG tablet, Take 1 tablet (20 mg total) by mouth 2 (two) times daily., Disp: 10 tablet, Rfl: 0 .  diphenhydrAMINE (  BENADRYL) 25 MG tablet, Take 25 mg by mouth as needed for itching or allergies., Disp: , Rfl:  .  hydrOXYzine (ATARAX/VISTARIL) 25 MG tablet, Take 1 tablet (25 mg total) by mouth every 6 (six) hours., Disp: 12 tablet, Rfl: 0 .  ondansetron (ZOFRAN ODT) 4 MG disintegrating tablet, Take 1 tablet (4 mg total) by mouth every 8 (eight) hours as needed for nausea or vomiting., Disp: 6 tablet, Rfl: 0 .  predniSONE (DELTASONE) 50 MG tablet, Take 1 tablet (50 mg total) by mouth daily with breakfast., Disp:  5 tablet, Rfl: 0    LKW: 2 PM tpa given?: No, symptoms not consistent with a stroke IR Thrombectomy? No, minutes of E LVO on examination Modified Rankin Scale: 0-Completely asymptomatic and back to baseline post- stroke Time of teleneurologist evaluation: 1559 hrs.  Exam: Vitals:   05/21/21 1605 05/21/21 1626  BP: (!) 150/88 (!) 150/88  Pulse: 76 85  Resp: 19 (!) 24  Temp:    SpO2: 99% 98%    General: Awake alert slightly anxious in no apparent distress. HEENT: Normocephalic atraumatic CVS: Regular rate and rhythm on the monitor Respiratory: Breathing well saturating normally on room air Neurological exam Awake alert oriented x3 Speech not dysarthric No evidence of aphasia Cranial nerves II to XII intact-with the exception of mild right facial diminution of sensation when compared to the left. Motor examination with no drift in any of the 4 extremities Sensation diminished to light touch on the left hemibody in comparison to the right No dysmetria Gait testing deferred  NIHSS 1A: Level of Consciousness - 0 1B: Ask Month and Age - 0 1C: 'Blink Eyes' & 'Squeeze Hands' - 0 2: Test Horizontal Extraocular Movements - 0 3: Test Visual Fields - 0 4: Test Facial Palsy - 0 5A: Test Left Arm Motor Drift - 0 5B: Test Right Arm Motor Drift - 0 6A: Test Left Leg Motor Drift - 0 6B: Test Right Leg Motor Drift - 0 7: Test Limb Ataxia - 0 8: Test Sensation - 1 9: Test Language/Aphasia- 0 10: Test Dysarthria - 0 11: Test Extinction/Inattention - 0 NIHSS score: 1   Imaging Reviewed: CT head with no acute changes.  No bleed.  No evolving large infarct  Labs reviewed in epic and pertinent values follow: CBC    Component Value Date/Time   WBC 8.4 05/21/2021 1549   RBC 4.34 05/21/2021 1549   HGB 13.1 05/21/2021 1549   HCT 39.2 05/21/2021 1549   PLT 283 05/21/2021 1549   MCV 90.3 05/21/2021 1549   MCH 30.2 05/21/2021 1549   MCHC 33.4 05/21/2021 1549   RDW 12.8 05/21/2021  1549   LYMPHSABS 3.8 05/21/2021 1549   MONOABS 0.7 05/21/2021 1549   EOSABS 0.3 05/21/2021 1549   BASOSABS 0.1 05/21/2021 1549   CMP     Component Value Date/Time   NA 141 05/21/2021 1544   K 3.6 05/21/2021 1544   CL 105 05/21/2021 1544   CO2 19 (L) 10/15/2020 1333   GLUCOSE 146 (H) 05/21/2021 1544   BUN 5 (L) 05/21/2021 1544   CREATININE 0.60 05/21/2021 1544   CALCIUM 9.5 10/15/2020 1333   PROT 7.9 10/15/2020 1333   ALBUMIN 4.0 10/15/2020 1333   AST 23 10/15/2020 1333   ALT 28 10/15/2020 1333   ALKPHOS 105 10/15/2020 1333   BILITOT 0.4 10/15/2020 1333   GFRNONAA >60 10/15/2020 1333   GFRAA >60 07/10/2020 0433     Assessment:  40 year old with past history  of anxiety, asthma, gastroesophageal reflux, hypertension, migraine, peripheral vascular disease presenting for sudden onset of chest discomfort, mild headache, right-sided weakness, right-sided numbness, slurred versus hypophonic speech-symptoms rapidly resolving with NIH stroke scale on my examination of 1 for subjective sensory symptoms. Endorses some stressors due to current personal and family situations. Most likely symptoms are related to anxiety but given her young age and focal symptoms, I would recommend getting a stat MRI so that we are not missing a more ominous diagnosis. Not a candidate for tPA due to symptoms less consistent with stroke and very low NIH stroke scale. Not a candidate for EVT due to clinical examination not suspicious for large vessel occlusion.  Differentials include: Complex migraine versus anxiety versus less likely stroke  Recommendations:  Stat MRI of the brain Migraine cocktail If the MRI of the brain shows a stroke, will need admission for stroke work-up otherwise supportive care and headache management in the ER and possible discharge home. Plan was discussed with the ED provider Dr. Ronnald Nian.  Will follow up on the imaging.  ADDENDUM MRI Brain reviewed - no stroke. Formal read  below MR Brain w/o IMPRESSION: 1. No evidence of acute intracranial abnormality. Specifically, no acute infarct. 2. 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 While these findings are nonspecific and potentially incidental, they can be seen with idiopathic intracranial hypertension in the correct clinical setting.  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  No need for LP - headache was minimal and s/s not consistent with IIHT (MRI has non specific partially empty sella which is a non specific finding but in correct clinical context can point to IIHT-which is not the case with her).  Can d/c home with OP neurology f/u with [facility] for headache prophylaxis discussion and f/u  D/W Dr Ronnald Nian    This patient is receiving care for possible acute neurological changes. There was 52 minutes of care by this provider at the time of service, including time for direct evaluation via telemedicine, review of medical records, imaging studies and discussion of findings with providers, the patient and/or family.  -- Amie Portland, MD [facility] Pager: [phone] If 7pm to 7am, please call on call as listed on AMION.

## 2021-05-21 NOTE — ED Notes (Signed)
Patient transported to CT with this nurse.

## 2021-05-21 NOTE — ED Provider Notes (Signed)
[facility] DEPT Provider Note   CSN: [id number] Arrival date & time: 05/21/21  1520     History Chief Complaint  Patient presents with  . Aphasia  . Chest Pain    [name] is a 40 y.o. female.  Patient states about 30 minutes prior to arrival she had some pain in the left side of her chest with a sensation of needing to burp.  She has a history of bad reflux.  She had just eaten some food prior.  Patient also felt like the right side of her face was numb and that she was having trouble speaking.  Has a history of migraines and may be a mild headache with it but not typical of her migraines.  She has been under a lot of stress.  She denies any nausea or vomiting.  She did not think that she had facial weakness or extremity weakness.  Pain in the chest did not radiate and overall appears to be improving.  No history of heart attacks, stroke.  The history is provided by the patient.  Illness Severity:  Mild Onset quality:  Sudden Timing:  Constant Progression:  Improving Chronicity:  New Relieved by:  Nothing Worsened by:  Nothing Associated symptoms: chest pain   Associated symptoms: no abdominal pain, no congestion, no cough, no diarrhea, no ear pain, no fatigue, no fever, no headaches, no loss of consciousness, no myalgias, no nausea, no rash, no rhinorrhea, no shortness of breath, no sore throat, no vomiting and no wheezing        Past Medical History:  Diagnosis Date  . Anxiety   . Arthritis   . Asthma    meds as needed  . Bronchitis   . Chronic back pain   . Ectopic pregnancy    requiring surgery  . Elevated liver enzymes   . GERD (gastroesophageal reflux disease)   . Gonorrhea   . Hypertension   . Irritable bowel   . Migraine headache   . Peripheral vascular disease ([place])   . Pneumonia    2017  . Preterm labor   . 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 Lower abdominal pain 01/22/2019  . Post-operative state 11/18/2017  . Fibroid uterus 11/02/2015  . Abnormal uterine bleeding (AUB) 11/02/2015  . Asthma exacerbation 03/23/2015  . Tobacco abuse 03/23/2015  . History of migraine 03/23/2015  . Acute asthma exacerbation   . Cough   . Acute bronchitis   . Asthma 03/22/2015  . Migraine, unspecified, without mention of intractable migraine without mention of status migrainosus 03/07/2014    Past Surgical History:  Procedure Laterality Date  . ABDOMINAL HYSTERECTOMY    . DILITATION & CURRETTAGE/HYSTROSCOPY WITH NOVASURE ABLATION N/A 02/20/2016   Procedure: HYSTEROSCOPY WITH NOVASURE ABLATION;  Surgeon: [name], MD;  Location: [facility];  Service: Gynecology;  Laterality: N/A;  . LAPAROSCOPIC VAGINAL HYSTERECTOMY WITH SALPINGECTOMY Left 11/18/2017   Procedure: LAPAROSCOPIC ASSISTED VAGINAL HYSTERECTOMY WITH SALPINGECTOMY;  Surgeon: Emily Filbert, MD;  Location: [facility];  Service: Gynecology;  Laterality: Left;  . LAPAROSCOPY  05/30/2012   Procedure: LAPAROSCOPY OPERATIVE;  Surgeon: Osborne Oman, MD;  Location: [facility];  Service: Gynecology;  Laterality: N/A;  Operative laparoscopy right salpingectomy and removal of ectopic pregnancy  . LAPAROSCOPY FOR ECTOPIC PREGNANCY    . SALPINGECTOMY    . TUBAL LIGATION  2005  . WISDOM  TOOTH EXTRACTION       OB History    Gravida  5   Para  3   Term  2   Preterm  1   AB  2   Living  3     SAB  1   IAB      Ectopic  1   Multiple      Live Births              Family History  Problem Relation Age of Onset  . Asthma Brother   . Breast cancer Cousin     Social History   Tobacco Use  . Smoking status: Current Every Day Smoker    Packs/day: 0.20    Years: 20.00    Pack years: 4.00    Types: Cigarettes    Last attempt to quit: 05/04/2016    Years since quitting: 5.0  . Smokeless tobacco: Never Used  Vaping Use  . Vaping Use: Some days  Substance Use  Topics  . Alcohol use: Yes    Alcohol/week: 1.0 standard drink    Types: 1 Glasses of wine per week    Comment: occasional  . Drug use: No    Home Medications Prior to Admission medications   Medication Sig Start Date End Date Taking? Authorizing Provider  omeprazole (PRILOSEC) 20 MG capsule Take 1 capsule (20 mg total) by mouth daily. 07/05/18 08/04/18 Yes Kinnie Feil, PA-C  acetaminophen (TYLENOL) 650 MG CR tablet Take 1,300 mg by mouth every 8 (eight) hours as needed for pain.    [provider]  acyclovir (ZOVIRAX) 400 MG tablet Take 1 tablet (400 mg total) by mouth 3 (three) times daily. 08/06/18   Robyn Haber, MD  albuterol (VENTOLIN HFA) 108 (90 Base) MCG/ACT inhaler Inhale 1-2 puffs into the lungs every 6 (six) hours as needed for wheezing or shortness of breath. 11/09/19   Tasia Catchings, Amy V, PA-C  cholecalciferol (VITAMIN D) 1000 units tablet Take 1,000 Units by mouth daily.    [provider]  dicyclomine (BENTYL) 20 MG tablet Take 1 tablet (20 mg total) by mouth 2 (two) times daily. 10/15/20   Volanda Napoleon, PA-C  diphenhydrAMINE (BENADRYL) 25 MG tablet Take 25 mg by mouth as needed for itching or allergies.    [provider]  hydrOXYzine (ATARAX/VISTARIL) 25 MG tablet Take 1 tablet (25 mg total) by mouth every 6 (six) hours. 08/08/18   Deno Etienne, DO  ondansetron (ZOFRAN ODT) 4 MG disintegrating tablet Take 1 tablet (4 mg total) by mouth every 8 (eight) hours as needed for nausea or vomiting. 10/15/20   Volanda Napoleon, PA-C  predniSONE (DELTASONE) 50 MG tablet Take 1 tablet (50 mg total) by mouth daily with breakfast. 11/09/19   Tasia Catchings, Amy V, PA-C    Allergies    Latex, Raspberry, and Vioxx [rofecoxib]  Review of Systems   Review of Systems  Constitutional: Negative for chills, fatigue and fever.  HENT: Negative for congestion, ear pain, rhinorrhea and sore throat.   Eyes: Negative for pain and visual disturbance.  Respiratory: Negative for  cough, shortness of breath and wheezing.   Cardiovascular: Positive for chest pain. Negative for palpitations.  Gastrointestinal: Negative for abdominal pain, diarrhea, nausea and vomiting.  Genitourinary: Negative for dysuria and hematuria.  Musculoskeletal: Negative for arthralgias, back pain and myalgias.  Skin: Negative for color change and rash.  Neurological: Positive for speech difficulty and numbness. Negative for dizziness, tremors, seizures, loss of consciousness,  syncope, facial asymmetry, weakness, light-headedness and headaches.  Psychiatric/Behavioral: The patient is nervous/anxious.   All other systems reviewed and are negative.   Physical Exam Updated Vital Signs  ED Triage Vitals  Enc Vitals Group     BP 05/21/21 1532 (!) 157/93     Pulse Rate 05/21/21 1532 73     Resp 05/21/21 1532 (!) 22     Temp 05/21/21 1532 98.8 F (37.1 C)     Temp Source 05/21/21 1532 Oral     SpO2 05/21/21 1532 99 %     Weight 05/21/21 1538 188 lb 15 oz (85.7 kg)     Height 05/21/21 1538 5\' 4"  (1.626 m)     Head Circumference --      Peak Flow --      Pain Score 05/21/21 1604 7     Pain Loc --      Pain Edu? --      Excl. in [place]? --     Physical Exam Vitals and nursing note reviewed.  Constitutional:      General: She is not in acute distress.    Appearance: She is well-developed. She is not ill-appearing.  HENT:     Head: Normocephalic and atraumatic.  Eyes:     Extraocular Movements: Extraocular movements intact.     Conjunctiva/sclera: Conjunctivae normal.     Pupils: Pupils are equal, round, and reactive to light.  Cardiovascular:     Rate and Rhythm: Normal rate and regular rhythm.     Pulses:          Radial pulses are 2+ on the right side and 2+ on the left side.       Dorsalis pedis pulses are 2+ on the right side and 2+ on the left side.     Heart sounds: Normal heart sounds. No murmur heard.   Pulmonary:     Effort: Pulmonary effort is normal. No respiratory  distress.     Breath sounds: Normal breath sounds. No decreased breath sounds, wheezing or rhonchi.  Chest:     Chest wall: Tenderness present.     Comments: Some chest wall tenderness to the left side of the chest Abdominal:     Palpations: Abdomen is soft.     Tenderness: There is no abdominal tenderness.  Musculoskeletal:     Cervical back: Normal range of motion and neck supple.     Right lower leg: No edema.     Left lower leg: No edema.  Skin:    General: Skin is warm and dry.     Capillary Refill: Capillary refill takes less than 2 seconds.  Neurological:     General: No focal deficit present.     Mental Status: She is alert and oriented to person, place, and time.     Cranial Nerves: No cranial nerve deficit.     Motor: No weakness.     Comments: 5+ out of 5 strength throughout, normal sensation except for numbness over the right side of her face, no obvious facial droop, no aphasia, no visual field deficit, normal speech     ED Results / Procedures / Treatments   Labs (all labs ordered are listed, but only abnormal results are displayed) Labs Reviewed  COMPREHENSIVE METABOLIC PANEL - Abnormal; Notable for the following components:      Result Value   Glucose, Bld 143 (*)    Calcium 8.8 (*)    All other components within normal limits  I-STAT CHEM 8,  ED - Abnormal; Notable for the following components:   BUN 5 (*)    Glucose, Bld 146 (*)    All other components within normal limits  CBG MONITORING, ED - Abnormal; Notable for the following components:   Glucose-Capillary 144 (*)    All other components within normal limits  PROTIME-INR  APTT  CBC  DIFFERENTIAL  I-STAT BETA HCG BLOOD, ED ([facility], [facility], [facility] ONLY)  TROPONIN I (HIGH SENSITIVITY)  TROPONIN I (HIGH SENSITIVITY)    EKG EKG Interpretation  Date/Time:  Monday May 21 2021 15:32:02 EDT Ventricular Rate:  77 PR Interval:  140 QRS Duration: 75 QT Interval:  373 QTC Calculation: 423 R Axis:   70 Text  Interpretation: Sinus rhythm Probable left atrial enlargement Confirmed by Lennice Sites ([id number]) on 05/21/2021 4:25:03 PM   Radiology MR BRAIN WO CONTRAST  Result Date: 05/21/2021 CLINICAL DATA:  Right-sided facial numbness. Evaluate for possible stroke. EXAM: MRI HEAD WITHOUT CONTRAST TECHNIQUE: Multiplanar, multiecho pulse sequences of the brain and surrounding structures were obtained without intravenous contrast. COMPARISON:  Same day head CT.  Prior MRI from June 02, 2017. FINDINGS: Brain: No acute infarction, hemorrhage, hydrocephalus, extra-axial collection or mass lesion. Vascular: Major arterial flow voids are maintained at the skull base. Skull and upper cervical spine: Normal marrow signal. Sinuses/Orbits: Mild prominence of the right optic nerve sheath, similar to prior MRI orbits from June 02, 2017. Other: No mastoid effusions. IMPRESSION: 1. No evidence of acute intracranial abnormality. Specifically, no acute infarct. 2. 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 While these findings are nonspecific and potentially incidental, they can be seen with idiopathic intracranial hypertension in the correct clinical setting. Electronically Signed   By: Margaretha Sheffield MD   On: 05/21/2021 18:48   DG Chest Portable 1 View  Result Date: 05/21/2021 CLINICAL DATA:  40 year old female with chest pain. EXAM: PORTABLE CHEST 1 VIEW COMPARISON:  Chest radiograph dated 07/10/2020 FINDINGS: Shallow inspiration. No focal consolidation, pleural effusion, or pneumothorax. The cardiac silhouette is within normal limits. No acute osseous pathology. IMPRESSION: No active disease. Electronically Signed   By: Anner Crete M.D.   On: 05/21/2021 17:29   CT HEAD CODE STROKE WO CONTRAST  Result Date: 05/21/2021 CLINICAL DATA:  Code stroke.  TIA.  Follow-up aphasia. EXAM: CT HEAD WITHOUT CONTRAST TECHNIQUE: Contiguous axial images were obtained from the base of the skull  through the vertex without intravenous contrast. COMPARISON:  CT head August 07, 2015. FINDINGS: Brain: No evidence of acute large vascular territory infarction, hemorrhage, hydrocephalus, extra-axial collection or mass lesion/mass effect. Partially empty sella. Vascular: No hyperdense vessel identified. Skull: No acute fracture. Sinuses/Orbits: Mild ethmoid air cell mucosal thickening. Remaining visualized sinuses are clear. No acute orbital abnormality. Other: No mastoid effusions. ASPECTS [facility] Stroke Program Early CT Score) total score (0-10 with 10 being normal): 10. IMPRESSION: 1. No evidence of acute intracranial abnormality.  ASPECTS is 10. 2. Partially empty sella. This finding is nonspecific and may be incidental, but it can be seen with idiopathic intracranial hypertension in the correct clinical setting. Code stroke imaging results were communicated on 05/21/2021 at 4:06 pm to provider Dr. Rory Percy via secure text paging. Electronically Signed   By: Margaretha Sheffield MD   On: 05/21/2021 16:07    Procedures Procedures   Medications Ordered in ED Medications  sodium chloride flush (NS) 0.9 % injection 3 mL (3 mLs Intravenous Given 05/21/21 1626)  alum & mag hydroxide-simeth (MAALOX/MYLANTA) 200-200-20  MG/5ML suspension 30 mL (30 mLs Oral Given 05/21/21 1623)    And  lidocaine (XYLOCAINE) 2 % viscous mouth solution 15 mL (15 mLs Oral Given 05/21/21 1623)  metoCLOPramide (REGLAN) injection 10 mg (10 mg Intravenous Given 05/21/21 1624)  diphenhydrAMINE (BENADRYL) injection 25 mg (25 mg Intravenous Given 05/21/21 1624)    ED Course  I have reviewed the triage vital signs and the nursing notes.  Pertinent labs & imaging results that were available during my care of the patient were reviewed by me and considered in my medical decision making (see chart for details).    MDM Rules/Calculators/A&P                          TRACEY HERMANCE is a 40 year old female with history of irritable  bowel, migraines, chronic back pain who presents to the ED with right-sided facial numbness, chest pain.  Happened about an hour prior to arrival.  Possibly had issues getting words out but that is improved.  Chest pain improved.  She denies any weakness or facial weakness but not completely sure.  Patient's family member did not notice any obvious weakness.  She has a bad history of reflux she states that her chest pain felt like she had her bad reflux again.  She had just had food.  She is also been under a lot of stress.  Neurologic exam is overall normal except for some subjective right-sided numbness.  Code stroke was initiated given concern for possible aphasia with numbness.  She does have a mild headache and this is not typical of her migraines to have numbness.  Head CT is normal.  Neurology recommends headache cocktail and MRI to rule out stroke.  Overall NIH scale is low and we will not use tPA.  Symptoms also improving and suspect that this is likely stress related or atypical migraine related.  Chest pain seems reflux related.  She has normal pulses throughout.  Not consistent with dissection.  EKG shows sinus rhythm.  We will get troponins.  No concern for PE.  MRI negative for stroke.  No significant anemia, electrolyte ab, kidney injury.  Troponin negative x2.  Overall work-up is unremarkable.  Suspect complex migraine.  Had relief of symptoms with headache cocktail.  Discharged in good condition.  This chart was dictated using voice recognition software.  Despite best efforts to proofread,  errors can occur which can change the documentation meaning.   Final Clinical Impression(s) / ED Diagnoses Final diagnoses:  Atypical chest pain  Other migraine without status migrainosus, not intractable    Rx / DC Orders ED Discharge Orders    None       Lennice Sites, DO 05/21/21 1940

## 2021-05-21 NOTE — ED Triage Notes (Signed)
Patient BIB POV c/o slurred speech, facial droop, and chest pain. Patient unable to speech in triage or get up from wheelchair, this all resolved in about 5-10 mins.  The chest pain when patient was able to speech was described as acid reflux or needing to burp.  Patient has a hx of anxiety and per her and her husband has been under a lot of stress lately.

## 2021-05-21 NOTE — ED Notes (Signed)
Patients vitals will be updated upon return from MRI

## 2021-05-21 NOTE — ED Notes (Signed)
Patient back from MRI.

## 2021-11-23 ENCOUNTER — Ambulatory Visit: Payer: Medicaid Other | Admitting: Neurology

## 2021-11-23 ENCOUNTER — Encounter: Payer: Self-pay | Admitting: Neurology

## 2021-11-23 VITALS — Ht 65.0 in | Wt 206.0 lb

## 2021-11-23 DIAGNOSIS — R404 Transient alteration of awareness: Secondary | ICD-10-CM

## 2021-11-23 DIAGNOSIS — G43709 Chronic migraine without aura, not intractable, without status migrainosus: Secondary | ICD-10-CM

## 2021-11-23 MED ORDER — ONDANSETRON 4 MG PO TBDP: 4.0000 mg | ORAL_TABLET | Freq: Three times a day (TID) | ORAL | 6 refills | Status: AC | PRN

## 2021-11-23 MED ORDER — RIZATRIPTAN BENZOATE 10 MG PO TBDP: 10.0000 mg | ORAL_TABLET | ORAL | 11 refills | Status: AC | PRN

## 2021-11-23 MED ORDER — TOPIRAMATE 100 MG PO TABS: 100.0000 mg | ORAL_TABLET | Freq: Two times a day (BID) | ORAL | 11 refills | Status: DC
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
## 2021-11-27 ENCOUNTER — Emergency Department (HOSPITAL_COMMUNITY)
Admission: EM | Admit: 2021-11-27 | Discharge: 2021-11-28 | Disposition: A | Discharge status: Home / Self Care | Payer: Medicaid Other | Attending: Emergency Medicine | Admitting: Emergency Medicine

## 2021-11-27 ENCOUNTER — Emergency Department (HOSPITAL_COMMUNITY): Payer: Medicaid Other

## 2021-11-27 ENCOUNTER — Ambulatory Visit (INDEPENDENT_AMBULATORY_CARE_PROVIDER_SITE_OTHER): Payer: Medicaid Other | Admitting: Neurology

## 2021-11-27 ENCOUNTER — Other Ambulatory Visit: Payer: Self-pay

## 2021-11-27 DIAGNOSIS — I10 Essential (primary) hypertension: Secondary | ICD-10-CM | POA: Diagnosis not present

## 2021-11-27 DIAGNOSIS — Z79899 Other long term (current) drug therapy: Secondary | ICD-10-CM | POA: Diagnosis not present

## 2021-11-27 DIAGNOSIS — R404 Transient alteration of awareness: Secondary | ICD-10-CM

## 2021-11-27 DIAGNOSIS — Z91040 Latex allergy status: Secondary | ICD-10-CM | POA: Insufficient documentation

## 2021-11-27 DIAGNOSIS — J45909 Unspecified asthma, uncomplicated: Secondary | ICD-10-CM | POA: Diagnosis not present

## 2021-11-27 DIAGNOSIS — G43709 Chronic migraine without aura, not intractable, without status migrainosus: Secondary | ICD-10-CM

## 2021-11-27 DIAGNOSIS — R410 Disorientation, unspecified: Secondary | ICD-10-CM | POA: Diagnosis not present

## 2021-11-27 DIAGNOSIS — R079 Chest pain, unspecified: Secondary | ICD-10-CM

## 2021-11-27 DIAGNOSIS — F17210 Nicotine dependence, cigarettes, uncomplicated: Secondary | ICD-10-CM | POA: Insufficient documentation

## 2021-11-27 DIAGNOSIS — R0789 Other chest pain: Secondary | ICD-10-CM | POA: Insufficient documentation

## 2021-11-27 DIAGNOSIS — R55 Syncope and collapse: Secondary | ICD-10-CM | POA: Diagnosis not present

## 2021-11-27 LAB — I-STAT CHEM 8, ED
BUN: 6 mg/dL (ref 6–20)
Calcium, Ion: 1.13 mmol/L — ABNORMAL LOW (ref 1.15–1.40)
Chloride: 107 mmol/L (ref 98–111)
Creatinine, Ser: 0.5 mg/dL (ref 0.44–1.00)
Glucose, Bld: 101 mg/dL — ABNORMAL HIGH (ref 70–99)
HCT: 34 % — ABNORMAL LOW (ref 36.0–46.0)
Hemoglobin: 11.6 g/dL — ABNORMAL LOW (ref 12.0–15.0)
Potassium: 3.6 mmol/L (ref 3.5–5.1)
Sodium: 140 mmol/L (ref 135–145)
TCO2: 23 mmol/L (ref 22–32)

## 2021-11-27 LAB — BASIC METABOLIC PANEL
Anion gap: 8 (ref 5–15)
BUN: 7 mg/dL (ref 6–20)
CO2: 20 mmol/L — ABNORMAL LOW (ref 22–32)
Calcium: 8.4 mg/dL — ABNORMAL LOW (ref 8.9–10.3)
Chloride: 107 mmol/L (ref 98–111)
Creatinine, Ser: 0.53 mg/dL (ref 0.44–1.00)
GFR, Estimated: >60 mL/min (ref >60)
Glucose, Bld: 105 mg/dL — ABNORMAL HIGH (ref 70–99)
Potassium: 3.5 mmol/L (ref 3.5–5.1)
Sodium: 135 mmol/L (ref 135–145)

## 2021-11-27 LAB — PROTIME-INR
INR: 1 (ref 0.8–1.2)
Prothrombin Time: 13.1 seconds (ref 11.4–15.2)

## 2021-11-27 LAB — CBC WITH DIFFERENTIAL/PLATELET
Abs Immature Granulocytes: 0.03 10*3/uL (ref 0.00–0.07)
Basophils Absolute: 0.1 10*3/uL (ref 0.0–0.1)
Basophils Relative: 1 %
Eosinophils Absolute: 0.2 10*3/uL (ref 0.0–0.5)
Eosinophils Relative: 2 %
HCT: 35.5 % — ABNORMAL LOW (ref 36.0–46.0)
Hemoglobin: 11.9 g/dL — ABNORMAL LOW (ref 12.0–15.0)
Immature Granulocytes: 0 %
Lymphocytes Relative: 36 %
Lymphs Abs: 3.1 10*3/uL (ref 0.7–4.0)
MCH: 30.6 pg (ref 26.0–34.0)
MCHC: 33.5 g/dL (ref 30.0–36.0)
MCV: 91.3 fL (ref 80.0–100.0)
Monocytes Absolute: 0.7 10*3/uL (ref 0.1–1.0)
Monocytes Relative: 8 %
Neutro Abs: 4.7 10*3/uL (ref 1.7–7.7)
Neutrophils Relative %: 53 %
Platelets: 231 10*3/uL (ref 150–400)
RBC: 3.89 MIL/uL (ref 3.87–5.11)
RDW: 12.6 % (ref 11.5–15.5)
WBC: 8.8 10*3/uL (ref 4.0–10.5)
nRBC: 0 % (ref 0.0–0.2)

## 2021-11-27 LAB — I-STAT BETA HCG BLOOD, ED (MC, WL, AP ONLY): I-stat hCG, quantitative: <5 m[IU]/mL (ref <5)

## 2021-11-27 LAB — TYPE AND SCREEN
ABO/RH(D): B POS
Antibody Screen: NEGATIVE

## 2021-11-27 LAB — TROPONIN I (HIGH SENSITIVITY)
Troponin I (High Sensitivity): 7 ng/L (ref <18)
Troponin I (High Sensitivity): 7 ng/L (ref <18)

## 2021-11-27 MED ORDER — IOHEXOL 350 MG/ML SOLN
100.0000 mL | Freq: Once | INTRAVENOUS | Status: AC | PRN
  Administered 2021-11-27: 100 mL via INTRAVENOUS

## 2021-11-27 NOTE — ED Triage Notes (Addendum)
Pt bib ems from home with reports of syncope when trying to get out of bed. Pt possibly hit the nightstand. Anterior neck pain. Pt also complaining of midsternal chest pain radiating to her back. 12 lead unremarkable. VSS with ems. Given 324mg  aspirin.

## 2021-11-27 NOTE — ED Notes (Addendum)
Pt and spouse called to inform this RN that she does not want more blood work done and that she is ready to go. Informed pt the blood work has already been done, we just awaiting the results. Will inform EDP.

## 2021-11-27 NOTE — ED Notes (Signed)
Patient transported to CT 

## 2021-11-27 NOTE — ED Provider Notes (Signed)
[facility] EMERGENCY DEPARTMENT Provider Note   CSN: [id number] Arrival date & time: 11/27/21  1919     History No chief complaint on file.   [name] is a 40 y.o. female.  40 year old female with prior medical history as detailed below presents for evaluation.  Patient complains of persistent chronic chest pain.  This appears to been ongoing for several weeks.  Patient presented today after having an apparent syncopal event this morning.  She may have hit her head against the nightstand as she passed out.  She cannot specifically deny full LOC.  She complains that her chest pain is constant.  Today there is radiation of the pain from her anterior chest to her posterior upper back.  Patient denies bleeding after her fall today.  She denies abdominal pain.  She denies fever.  She denies other complaint.  Her chest pain is not associated with diaphoresis or dyspnea.  The history is provided by the patient.  Illness Location:  Chest pain, syncope Severity:  Moderate Onset quality:  Unable to specify Timing:  Unable to specify Progression:  Unchanged Chronicity:  New     Past Medical History:  Diagnosis Date   Anxiety    Arthritis    Asthma    meds as needed   Bronchitis    Chronic back pain    Ectopic pregnancy    requiring surgery   Elevated liver enzymes    GERD (gastroesophageal reflux disease)    Gonorrhea    Hypertension    Irritable bowel    Migraine headache    Peripheral vascular disease ([place])    Pneumonia    2017   Preterm labor    Seizures ([facility])    fever induced during childhood    Patient Active Problem List   Diagnosis Date Noted   Alteration consciousness 11/23/2021   Chronic migraine w/o aura w/o status migrainosus, not intractable 11/23/2021   Lower abdominal pain 01/22/2019   Post-operative state 11/18/2017   Fibroid uterus 11/02/2015   Abnormal uterine bleeding (AUB) 11/02/2015   Asthma exacerbation 03/23/2015    Tobacco abuse 03/23/2015   History of migraine 03/23/2015   Acute asthma exacerbation    Cough    Acute bronchitis    Asthma 03/22/2015   Migraine, unspecified, without mention of intractable migraine without mention of status migrainosus 03/07/2014    Past Surgical History:  Procedure Laterality Date   ABDOMINAL HYSTERECTOMY     DILITATION & CURRETTAGE/HYSTROSCOPY WITH NOVASURE ABLATION N/A 02/20/2016   Procedure: HYSTEROSCOPY WITH NOVASURE ABLATION;  Surgeon: [name], MD;  Location: [facility];  Service: Gynecology;  Laterality: N/A;   LAPAROSCOPIC VAGINAL HYSTERECTOMY WITH SALPINGECTOMY Left 11/18/2017   Procedure: LAPAROSCOPIC ASSISTED VAGINAL HYSTERECTOMY WITH SALPINGECTOMY;  Surgeon: Emily Filbert, MD;  Location: [facility];  Service: Gynecology;  Laterality: Left;   LAPAROSCOPY  05/30/2012   Procedure: LAPAROSCOPY OPERATIVE;  Surgeon: Osborne Oman, MD;  Location: [facility];  Service: Gynecology;  Laterality: N/A;  Operative laparoscopy right salpingectomy and removal of ectopic pregnancy   LAPAROSCOPY FOR ECTOPIC PREGNANCY     SALPINGECTOMY     TUBAL LIGATION  2005   WISDOM TOOTH EXTRACTION       OB History     Gravida  5   Para  3   Term  2   Preterm  1   AB  2   Living  3      SAB  1   IAB  Ectopic  1   Multiple      Live Births              Family History  Problem Relation Age of Onset   Prostate cancer Father    Asthma Brother    Breast cancer Cousin     Social History   Tobacco Use   Smoking status: Every Day    Packs/day: 0.20    Years: 20.00    Pack years: 4.00    Types: Cigarettes    Last attempt to quit: 05/04/2016    Years since quitting: 5.5   Smokeless tobacco: Never   Tobacco comments:    "2 packs a month if that" now   Vaping Use   Vaping Use: Former  Substance Use Topics   Alcohol use: Not Currently    Alcohol/week: 1.0 standard drink    Types: 1 Glasses of wine per week    Comment: occasional   Drug  use: No    Home Medications Prior to Admission medications   Medication Sig Start Date End Date Taking? Authorizing Provider  acetaminophen (TYLENOL) 650 MG CR tablet Take 1,300 mg by mouth every 8 (eight) hours as needed for pain.    [provider]  albuterol (VENTOLIN HFA) 108 (90 Base) MCG/ACT inhaler Inhale 1-2 puffs into the lungs every 6 (six) hours as needed for wheezing or shortness of breath. 11/09/19   Tasia Catchings, Amy V, PA-C  dicyclomine (BENTYL) 20 MG tablet Take 1 tablet (20 mg total) by mouth 2 (two) times daily. 10/15/20   Providence Lanius A, PA-C  FAMOTIDINE PO Take by mouth.    [provider]  meclizine (ANTIVERT) 25 MG tablet Take 25 mg by mouth daily as needed. 11/09/21   [provider]  ondansetron (ZOFRAN-ODT) 4 MG disintegrating tablet Take 1 tablet (4 mg total) by mouth every 8 (eight) hours as needed for nausea or vomiting. 11/23/21   Marcial Pacas, MD  rizatriptan (MAXALT-MLT) 10 MG disintegrating tablet Take 1 tablet (10 mg total) by mouth as needed. May repeat in 2 hours if needed 11/23/21   Marcial Pacas, MD  topiramate (TOPAMAX) 100 MG tablet Take 1 tablet (100 mg total) by mouth 2 (two) times daily. 11/23/21   Marcial Pacas, MD  triamterene-hydrochlorothiazide (MAXZIDE-25) 37.5-25 MG tablet Take 1 tablet by mouth daily. 11/09/21   [provider]  Vitamin D, Ergocalciferol, (DRISDOL) 1.25 MG (50000 UNIT) CAPS capsule Take 50,000 Units by mouth once a week. 11/09/21   [provider]    Allergies    Latex, Raspberry, and Vioxx [rofecoxib]  Review of Systems   Review of Systems  All other systems reviewed and are negative.  Physical Exam Updated Vital Signs BP (!) 165/88   Pulse 66   Resp (!) 22   LMP 11/14/2017   SpO2 100%   Physical Exam Vitals and nursing note reviewed.  Constitutional:      General: She is not in acute distress.    Appearance: Normal appearance. She is well-developed.  HENT:     Head: Normocephalic and  atraumatic.  Eyes:     Conjunctiva/sclera: Conjunctivae normal.     Pupils: Pupils are equal, round, and reactive to light.  Cardiovascular:     Rate and Rhythm: Normal rate and regular rhythm.     Heart sounds: Normal heart sounds.  Pulmonary:     Effort: Pulmonary effort is normal. No respiratory distress.     Breath sounds: Normal breath sounds.  Abdominal:     General: There is no distension.     Palpations: Abdomen is soft.     Tenderness: There is no abdominal tenderness.  Musculoskeletal:        General: No deformity. Normal range of motion.     Cervical back: Normal range of motion and neck supple.  Skin:    General: Skin is warm and dry.  Neurological:     General: No focal deficit present.     Mental Status: She is alert and oriented to person, place, and time.    ED Results / Procedures / Treatments   Labs (all labs ordered are listed, but only abnormal results are displayed) Labs Reviewed  CBC WITH DIFFERENTIAL/PLATELET  BASIC METABOLIC PANEL  PROTIME-INR  I-STAT CHEM 8, ED  I-STAT BETA HCG BLOOD, ED ([facility], [facility], [facility] ONLY)  TYPE AND SCREEN  TROPONIN I (HIGH SENSITIVITY)    EKG EKG Interpretation  Date/Time:  Tuesday November 27 2021 19:59:19 EST Ventricular Rate:  71 PR Interval:  152 QRS Duration: 68 QT Interval:  392 QTC Calculation: 425 R Axis:   78 Text Interpretation: Normal sinus rhythm Normal ECG Confirmed by Dene Gentry [phone]) on 11/27/2021 8:05:23 PM  Radiology DG Chest Port 1 View  Result Date: 11/27/2021 CLINICAL DATA:  Chest pain. EXAM: PORTABLE CHEST 1 VIEW COMPARISON:  Chest x-ray 05/21/2021. FINDINGS: The heart size and mediastinal contours are within normal limits. Both lungs are clear. The visualized skeletal structures are unremarkable. IMPRESSION: No active disease. Electronically Signed   By: Ronney Asters M.D.   On: 11/27/2021 20:18    Procedures Procedures   Medications Ordered in ED Medications - No data to display  ED  Course  I have reviewed the triage vital signs and the nursing notes.  Pertinent labs & imaging results that were available during my care of the patient were reviewed by me and considered in my medical decision making (see chart for details).    MDM Rules/Calculators/A&P                           MDM  MSE complete  KERLY RIGSBEE was evaluated in Emergency Department on 11/27/2021 for the symptoms described in the history of present illness. She was evaluated in the context of the global COVID-19 pandemic, which necessitated consideration that the patient might be at risk for infection with the SARS-CoV-2 virus that causes COVID-19. Institutional protocols and algorithms that pertain to the evaluation of patients at risk for COVID-19 are in a state of rapid change based on information released by regulatory bodies including the CDC and federal and state organizations. These policies and algorithms were followed during the patient's care in the ED.   Patient presented with multiple complaints.  Patient with longstanding chest discomfort that appears to be chronic in nature.  Patient also reported syncopal event that occurred early this morning.  CT imaging of the head, C-spine, and a dissection protocol CT of the chest and abdomen are without significant abnormality.  Screening labs obtained thus far without significant abnormality.  Reassuringly the patient's EKG is without evidence of acute ischemia.  Initial troponin is 7.  Second troponin is pending.  Patient will likely be able to be discharged.  She appears to be reassured by the results thus far obtained in the ED today.  She does understand the need for close follow-up with her regular care providers in the outpatient setting.  Strict return  precautions given and understood.  Pending labs and disposition signed out to Dr. Christy Gentles.  Final Clinical Impression(s) / ED Diagnoses Final diagnoses:  Syncope and collapse   Chest pain, unspecified type    Rx / DC Orders ED Discharge Orders     None        Valarie Merino, MD 11/28/21 1455

## 2021-11-27 NOTE — Discharge Instructions (Addendum)
Return for any problem.  ?

## 2021-11-28 NOTE — ED Provider Notes (Signed)
I assumed care in signout to f/u on labs Pt presented with CP/Syncope Extensive workup including imaging and labs are unremarkable I personally reviewed labs/ekg Vitals are appropriate Will refer to cardiology as she has already seen neurology    Ripley Fraise, MD 11/28/21 [phone]

## 2021-12-07 NOTE — Procedures (Signed)
° °  HISTORY: 40 year old female, with episode of transient loss of consciousness  TECHNIQUE:  This is a routine 16 channel EEG recording with one channel devoted to a limited EKG recording.  It was performed during wakefulness, drowsiness and asleep.  Hyperventilation and photic stimulation were performed as activating procedures.  There are frequent bilateral frontal muscle artifact.  Upon maximum arousal, posterior dominant waking rhythm consistent of rhythmic alpha range activity activities are symmetric over the bilateral posterior derivations and attenuated with eye opening.  Hyperventilation produced mild/moderate buildup with higher amplitude and the slower activities noted.  Photic stimulation did not alter the tracing.  During EEG recording, patient developed drowsiness and entered sleep, sleep EEG demonstrated architecture, there were frontal centrally dominant vertex waves and symmetric sleep spindles noted.  During EEG recording, there was no epileptiform discharge noted.  EKG demonstrate sinus rhythm, with heart rate of 64 bpm  CONCLUSION: This is a  normal awake and sleep EEG.  There is no electrodiagnostic evidence of epileptiform discharge.  Marcial Pacas, M.D. Ph.D.  [facility] [place], [place] [address] Phone: [phone] Fax:      [phone]

## 2021-12-23 DIAGNOSIS — M419 Scoliosis, unspecified: Secondary | ICD-10-CM

## 2021-12-23 HISTORY — DX: Scoliosis, unspecified: M41.9

## 2022-01-02 ENCOUNTER — Ambulatory Visit: Payer: Medicaid Other | Admitting: Internal Medicine

## 2022-01-02 NOTE — Progress Notes (Deleted)
Cardiology Office Note:    Date:  01/02/2022   ID:  [name], DOB 1981/03/07, MRN [id number]  PCP:  Nolene Ebbs, MD   [facility] Providers Cardiologist:  None { Click to update primary MD,subspecialty MD or APP then REFRESH:1}    Referring MD: Marcial Pacas, MD   No chief complaint on file. Vasovagal syncope  History of Present Illness:    [name] is a 41 y.o. female with a hx of chronic migraines, referral for loss of consciousness seen by neurology with normal EEG  She reported 2 episodes of passing out November 2022. She was in the kitchen and felt lightheaded. No vertigo signs. She had no chest pain. No palpitations. She had an EEG that was normal. She went to the ED with persistent chronic chest pain for a few weeks. Also reported syncopal event. She reported radiation to her arm. NO dyspnea. She had a normal EKG. Troponin was negative.   Past Medical History:  Diagnosis Date   Anxiety    Arthritis    Asthma    meds as needed   Bronchitis    Chronic back pain    Ectopic pregnancy    requiring surgery   Elevated liver enzymes    GERD (gastroesophageal reflux disease)    Gonorrhea    Hypertension    Irritable bowel    Migraine headache    Peripheral vascular disease ([place])    Pneumonia    2017   Preterm labor    Seizures (HCC)    fever induced during childhood    Past Surgical History:  Procedure Laterality Date   ABDOMINAL HYSTERECTOMY     DILITATION & CURRETTAGE/HYSTROSCOPY WITH NOVASURE ABLATION N/A 02/20/2016   Procedure: HYSTEROSCOPY WITH NOVASURE ABLATION;  Surgeon: [name], MD;  Location: [facility];  Service: Gynecology;  Laterality: N/A;   LAPAROSCOPIC VAGINAL HYSTERECTOMY WITH SALPINGECTOMY Left 11/18/2017   Procedure: LAPAROSCOPIC ASSISTED VAGINAL HYSTERECTOMY WITH SALPINGECTOMY;  Surgeon: Emily Filbert, MD;  Location: [facility];  Service: Gynecology;  Laterality: Left;   LAPAROSCOPY  05/30/2012   Procedure:  LAPAROSCOPY OPERATIVE;  Surgeon: Osborne Oman, MD;  Location: [facility];  Service: Gynecology;  Laterality: N/A;  Operative laparoscopy right salpingectomy and removal of ectopic pregnancy   LAPAROSCOPY FOR ECTOPIC PREGNANCY     SALPINGECTOMY     TUBAL LIGATION  2005   WISDOM TOOTH EXTRACTION      Current Medications: No outpatient medications have been marked as taking for the 01/02/22 encounter (Appointment) with Janina Mayo, MD.     Allergies:   Latex, Raspberry, and Vioxx [rofecoxib]   Social History   Socioeconomic History   Marital status: Married    Spouse name: Not on file   Number of children: 2   Years of education: GED   Highest education level: Not on file  Occupational History    Employer: [employer]    Comment: [employer]   Tobacco Use   Smoking status: Every Day    Packs/day: 0.20    Years: 20.00    Pack years: 4.00    Types: Cigarettes    Last attempt to quit: 05/04/2016    Years since quitting: 5.6   Smokeless tobacco: Never   Tobacco comments:    "2 packs a month if that" now   Vaping Use   Vaping Use: Former  Substance and Sexual Activity   Alcohol use: Not Currently    Alcohol/week: 1.0 standard drink    Types: 1  Glasses of wine per week    Comment: occasional   Drug use: No   Sexual activity: Yes    Birth control/protection: Surgical  Other Topics Concern   Not on file  Social History Narrative   Patient lives at home with her husband and children. Patient works full time at [employer].Right handedEducation GED One cup of caffeine daily   Social Determinants of Health   Financial Resource Strain: Not on file  Food Insecurity: Not on file  Transportation Needs: Not on file  Physical Activity: Not on file  Stress: Not on file  Social Connections: Not on file     Family History: The patient's ***family history includes Asthma in her brother; Breast cancer in her cousin; Prostate cancer in her father.  ROS:   Please see the history of present  illness.    *** All other systems reviewed and are negative.  EKGs/Labs/Other Studies Reviewed:    The following studies were reviewed today: ***  EKG:  EKG is *** ordered today.  The ekg ordered today demonstrates ***  Recent Labs: 05/21/2021: ALT 17 11/27/2021: BUN 6; Creatinine, Ser 0.50; Hemoglobin 11.6; Platelets 231; Potassium 3.6; Sodium 140  Recent Lipid Panel No results found for: CHOL, TRIG, HDL, CHOLHDL, VLDL, LDLCALC, LDLDIRECT   Risk Assessment/Calculations:   {Does this patient have ATRIAL FIBRILLATION?:[phone]}       Physical Exam:    VS:  LMP 11/14/2017     Wt Readings from Last 3 Encounters:  11/27/21 206 lb (93.4 kg)  11/23/21 206 lb (93.4 kg)  05/21/21 210 lb (95.3 kg)     GEN: *** Well nourished, well developed in no acute distress HEENT: Normal NECK: No JVD; No carotid bruits LYMPHATICS: No lymphadenopathy CARDIAC: ***RRR, no murmurs, rubs, gallops RESPIRATORY:  Clear to auscultation without rales, wheezing or rhonchi  ABDOMEN: Soft, non-tender, non-distended MUSCULOSKELETAL:  No edema; No deformity  SKIN: Warm and dry NEUROLOGIC:  Alert and oriented x 3 PSYCHIATRIC:  Normal affect   ASSESSMENT:    No diagnosis found. PLAN:    In order of problems listed above:  ***      {Are you ordering a CV Procedure (e.g. stress test, cath, DCCV, TEE, etc)?   Press F2        :[id number]}    Medication Adjustments/Labs and Tests Ordered: Current medicines are reviewed at length with the patient today.  Concerns regarding medicines are outlined above.  No orders of the defined types were placed in this encounter.  No orders of the defined types were placed in this encounter.   There are no Patient Instructions on file for this visit.   Signed, Janina Mayo, MD  01/02/2022 2:19 PM    [facility]

## 2022-01-03 ENCOUNTER — Encounter: Payer: Self-pay | Admitting: Internal Medicine

## 2022-01-03 ENCOUNTER — Ambulatory Visit (INDEPENDENT_AMBULATORY_CARE_PROVIDER_SITE_OTHER): Payer: Medicaid Other | Admitting: Internal Medicine

## 2022-01-03 ENCOUNTER — Other Ambulatory Visit: Payer: Self-pay

## 2022-01-03 VITALS — BP 140/74 | HR 65 | Ht 64.0 in | Wt 203.0 lb

## 2022-01-03 DIAGNOSIS — R42 Dizziness and giddiness: Secondary | ICD-10-CM

## 2022-01-03 NOTE — Patient Instructions (Signed)

## 2022-01-03 NOTE — Progress Notes (Signed)
Cardiology Office Note:    Date:  01/03/2022   ID:  MEYER DOCKERY, DOB [birth year], MRN [id number]  PCP:  Nolene Ebbs, MD   [facility] Providers Cardiologist:  Janina Mayo, MD     Referring MD: Marcial Pacas, MD   No chief complaint on file. ? Syncopal event, atypical chest pain  History of Present Illness:    [name] is a 41 y.o. female with a hx of ectopic pregnancy, anxiety, chronic migraine headaches w/ aura referral for syncopal w/u  [name] went to the ED on 11/27/2021. She came due to ? Sycnopal event. She also reported chest pain from the front of her chest to the back. She had a constellation of issues. The pain she described was long standing. She had CT  of her head/neck that was normal. Her troponin was negative. Her EKG was normal.  Today, she says she passed out at home. She has dizzy spells. She has her good day and bad days.  The light bothers her. She has pain at the base of her neck. She has triggers for the syncopal or presyncopal episodes including dizziness or loud noises. She has pain at the base of her neck. She got an EEG which was normal.   She has no cardiac disease hx. She noted stroke like symptoms and going to the ED. She was ruled out for stroke 05/21/2021.  She is a current smoker. She smoked from age 14 to now. She did quit at one time.  She denies family hx of heart disease. Her paternal grandmother had a pacemaker.   Past Medical History:  Diagnosis Date   Anxiety    Arthritis    Asthma    meds as needed   Bronchitis    Chronic back pain    Ectopic pregnancy    requiring surgery   Elevated liver enzymes    GERD (gastroesophageal reflux disease)    Gonorrhea    Hypertension    Irritable bowel    Migraine headache    Peripheral vascular disease ([place])    Pneumonia    2017   Preterm labor    Seizures (HCC)    fever induced during childhood    Past Surgical History:  Procedure Laterality Date    ABDOMINAL HYSTERECTOMY     DILITATION & CURRETTAGE/HYSTROSCOPY WITH NOVASURE ABLATION N/A 02/20/2016   Procedure: HYSTEROSCOPY WITH NOVASURE ABLATION;  Surgeon: [name], MD;  Location: [facility];  Service: Gynecology;  Laterality: N/A;   LAPAROSCOPIC VAGINAL HYSTERECTOMY WITH SALPINGECTOMY Left 11/18/2017   Procedure: LAPAROSCOPIC ASSISTED VAGINAL HYSTERECTOMY WITH SALPINGECTOMY;  Surgeon: Emily Filbert, MD;  Location: [facility];  Service: Gynecology;  Laterality: Left;   LAPAROSCOPY  05/30/2012   Procedure: LAPAROSCOPY OPERATIVE;  Surgeon: Osborne Oman, MD;  Location: [facility];  Service: Gynecology;  Laterality: N/A;  Operative laparoscopy right salpingectomy and removal of ectopic pregnancy   LAPAROSCOPY FOR ECTOPIC PREGNANCY     SALPINGECTOMY     TUBAL LIGATION  2005   WISDOM TOOTH EXTRACTION      Current Medications: Current Meds  Medication Sig   acetaminophen (TYLENOL) 650 MG CR tablet Take 1,300 mg by mouth every 8 (eight) hours as needed for pain.   albuterol (VENTOLIN HFA) 108 (90 Base) MCG/ACT inhaler Inhale 1-2 puffs into the lungs every 6 (six) hours as needed for wheezing or shortness of breath.   dicyclomine (BENTYL) 20 MG tablet Take 1 tablet (20 mg total) by mouth 2 (  two) times daily.   FAMOTIDINE PO Take by mouth.   meclizine (ANTIVERT) 25 MG tablet Take 25 mg by mouth daily as needed.   ondansetron (ZOFRAN-ODT) 4 MG disintegrating tablet Take 1 tablet (4 mg total) by mouth every 8 (eight) hours as needed for nausea or vomiting.   rizatriptan (MAXALT-MLT) 10 MG disintegrating tablet Take 1 tablet (10 mg total) by mouth as needed. May repeat in 2 hours if needed   topiramate (TOPAMAX) 100 MG tablet Take 1 tablet (100 mg total) by mouth 2 (two) times daily.   triamterene-hydrochlorothiazide (MAXZIDE-25) 37.5-25 MG tablet Take 1 tablet by mouth daily.   Vitamin D, Ergocalciferol, (DRISDOL) 1.25 MG (50000 UNIT) CAPS capsule Take 50,000 Units by mouth once a week.      Allergies:   Latex, Raspberry, and Vioxx [rofecoxib]   Social History   Socioeconomic History   Marital status: Married    Spouse name: Not on file   Number of children: 2   Years of education: GED   Highest education level: Not on file  Occupational History    Employer: [employer]    Comment: [employer]   Tobacco Use   Smoking status: Every Day    Packs/day: 0.20    Years: 20.00    Pack years: 4.00    Types: Cigarettes    Last attempt to quit: 05/04/2016    Years since quitting: 5.6   Smokeless tobacco: Never   Tobacco comments:    "2 packs a month if that" now   Vaping Use   Vaping Use: Former  Substance and Sexual Activity   Alcohol use: Not Currently    Alcohol/week: 1.0 standard drink    Types: 1 Glasses of wine per week    Comment: occasional   Drug use: No   Sexual activity: Yes    Birth control/protection: Surgical  Other Topics Concern   Not on file  Social History Narrative   Patient lives at home with her husband and children. Patient works full time at [employer].Right handedEducation GED One cup of caffeine daily   Social Determinants of Health   Financial Resource Strain: Not on file  Food Insecurity: Not on file  Transportation Needs: Not on file  Physical Activity: Not on file  Stress: Not on file  Social Connections: Not on file     Family History: The patient's family history includes Asthma in her brother; Breast cancer in her cousin; Prostate cancer in her father.  ROS:   Please see the history of present illness.     All other systems reviewed and are negative.  EKGs/Labs/Other Studies Reviewed:    The following studies were reviewed today:   EKG:  EKG is ordered today.  The ekg ordered today demonstrates   NSR  Recent Labs: 05/21/2021: ALT 17 11/27/2021: BUN 6; Creatinine, Ser 0.50; Hemoglobin 11.6; Platelets 231; Potassium 3.6; Sodium 140  Recent Lipid Panel No results found for: CHOL, TRIG, HDL, CHOLHDL, VLDL, LDLCALC,  LDLDIRECT   Risk Assessment/Calculations:           Physical Exam:    VS:  BP 140/74    Pulse 65    Ht 5\' 4"  (1.626 m)    Wt 203 lb (92.1 kg)    LMP 11/14/2017    SpO2 99%    BMI 34.84 kg/m     Wt Readings from Last 3 Encounters:  01/03/22 203 lb (92.1 kg)  11/27/21 206 lb (93.4 kg)  11/23/21 206 lb (93.4 kg)  GEN:  Well nourished, well developed in no acute distress HEENT: Normal NECK: No JVD; No carotid bruits LYMPHATICS: No lymphadenopathy CARDIAC: RRR, no murmurs, rubs, gallops RESPIRATORY:  Clear to auscultation without rales, wheezing or rhonchi  ABDOMEN: Soft, non-tender, non-distended MUSCULOSKELETAL:  No edema; No deformity  SKIN: Warm and dry NEUROLOGIC:  Alert and oriented x 3 PSYCHIATRIC:  Normal affect   ASSESSMENT:    #Dizziness/Vasovagal Syncope: her symptoms are not consistent with coronary disease or arrhythmia. She is very low risk for CVD. Her syncopal symptoms typically include triggers c/w possible vasovagal syncope. She notes dizziness with position changes in the bed which can be c/w BPPV. - low concern for cardiac cause of symptoms - can consider therapy for migraine and vertigo to see if these alleviates symptoms   PLAN:    In order of problems listed above:  Follow up PRN          Medication Adjustments/Labs and Tests Ordered: Current medicines are reviewed at length with the patient today.  Concerns regarding medicines are outlined above.  No orders of the defined types were placed in this encounter.  No orders of the defined types were placed in this encounter.   Patient Instructions  Medication Instructions:  No Changes In Medications at this time.  *If you need a refill on your cardiac medications before your next appointment, please call your pharmacy*  Follow-Up: At [facility], you and your health needs are our priority.  As part of our continuing mission to provide you with exceptional heart care, we have created  designated Provider Care Teams.  These Care Teams include your primary Cardiologist (physician) and Advanced Practice Providers (APPs -  Physician Assistants and Nurse Practitioners) who all work together to provide you with the care you need, when you need it.  Your next appointment:   AS NEEDED   The format for your next appointment:   In Person  Provider:   Janina Mayo, MD     Signed, Janina Mayo, MD  01/03/2022 4:49 PM    [place]

## 2022-02-25 ENCOUNTER — Encounter: Payer: Self-pay | Admitting: Neurology

## 2022-02-25 ENCOUNTER — Ambulatory Visit: Payer: Medicaid Other | Admitting: Neurology

## 2022-02-25 VITALS — BP 160/97 | HR 65 | Ht 64.0 in | Wt 208.5 lb

## 2022-02-25 DIAGNOSIS — G43709 Chronic migraine without aura, not intractable, without status migrainosus: Secondary | ICD-10-CM

## 2022-02-25 DIAGNOSIS — R404 Transient alteration of awareness: Secondary | ICD-10-CM | POA: Diagnosis not present

## 2022-02-25 DIAGNOSIS — R55 Syncope and collapse: Secondary | ICD-10-CM
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
## 2022-02-26 ENCOUNTER — Encounter: Payer: Self-pay | Admitting: Neurology

## 2022-02-26 ENCOUNTER — Telehealth: Payer: Self-pay | Admitting: Neurology

## 2022-02-26 LAB — CBC WITH DIFFERENTIAL/PLATELET
Basophils Absolute: 0 10*3/uL (ref 0.0–0.2)
Basos: 1 %
EOS (ABSOLUTE): 0.1 10*3/uL (ref 0.0–0.4)
Eos: 2 %
Hematocrit: 38.7 % (ref 34.0–46.6)
Hemoglobin: 12.9 g/dL (ref 11.1–15.9)
Immature Grans (Abs): 0 10*3/uL (ref 0.0–0.1)
Immature Granulocytes: 0 %
Lymphocytes Absolute: 2.6 10*3/uL (ref 0.7–3.1)
Lymphs: 41 %
MCH: 30.2 pg (ref 26.6–33.0)
MCHC: 33.3 g/dL (ref 31.5–35.7)
MCV: 91 fL (ref 79–97)
Monocytes Absolute: 0.5 10*3/uL (ref 0.1–0.9)
Monocytes: 8 %
Neutrophils Absolute: 3.2 10*3/uL (ref 1.4–7.0)
Neutrophils: 48 %
Platelets: 265 10*3/uL (ref 150–450)
RBC: 4.27 x10E6/uL (ref 3.77–5.28)
RDW: 13.1 % (ref 11.7–15.4)
WBC: 6.5 10*3/uL (ref 3.4–10.8)

## 2022-02-26 LAB — COMPREHENSIVE METABOLIC PANEL
ALT: 13 IU/L (ref 0–32)
AST: 7 IU/L (ref 0–40)
Albumin/Globulin Ratio: 1.6 (ref 1.2–2.2)
Albumin: 4.1 g/dL (ref 3.8–4.8)
Alkaline Phosphatase: 164 IU/L — ABNORMAL HIGH (ref 44–121)
BUN/Creatinine Ratio: 11 (ref 9–23)
BUN: 7 mg/dL (ref 6–24)
Bilirubin Total: 0.3 mg/dL (ref 0.0–1.2)
CO2: 24 mmol/L (ref 20–29)
Calcium: 9.2 mg/dL (ref 8.7–10.2)
Chloride: 105 mmol/L (ref 96–106)
Creatinine, Ser: 0.65 mg/dL (ref 0.57–1.00)
Globulin, Total: 2.5 g/dL (ref 1.5–4.5)
Glucose: 86 mg/dL (ref 70–99)
Potassium: 3.7 mmol/L (ref 3.5–5.2)
Sodium: 140 mmol/L (ref 134–144)
Total Protein: 6.6 g/dL (ref 6.0–8.5)
eGFR: 113 mL/min/{1.73_m2} (ref >59)

## 2022-02-26 LAB — HGB A1C W/O EAG: Hgb A1c MFr Bld: 5.2 % (ref 4.8–5.6)

## 2022-02-26 LAB — FERRITIN: Ferritin: 73 ng/mL (ref 15–150)

## 2022-02-26 LAB — TOPIRAMATE LEVEL: Topiramate Lvl: <1.5 ug/mL — ABNORMAL LOW (ref 2.0–25.0)

## 2022-02-26 LAB — TSH: TSH: 0.333 u[IU]/mL — ABNORMAL LOW (ref 0.450–4.500)

## 2022-02-26 LAB — FOLATE: Folate: 8.3 ng/mL (ref >3.0)

## 2022-02-26 LAB — VITAMIN B12: Vitamin B-12: 564 pg/mL (ref 232–1245)

## 2022-02-26 LAB — RPR: RPR Ser Ql: NONREACTIVE

## 2022-02-26 NOTE — Telephone Encounter (Signed)
Spoke with patient and informed of lab results. Informed her to f/u with PCP for TSH and alkaline phosphate levels. Informed pt Topamax level was low, patient inquired about the significance of Topamax level. The purpose of this medication and medication compliance was explained to her. Instructed patient to call back with any further questions or concerns. Patient verbalized understanding and expressed appreciation for the call. All questions answered. ?

## 2022-02-26 NOTE — Telephone Encounter (Signed)
Please call patient, laboratory evaluation showed mildly decreased TSH, this could indicate hyperthyroidism, but needs to confirm with repeat more comprehensive thyroid functional test, this can be coordinated by his primary care physician Nolene Ebbs, MD ? ?In addition, there is evidence of slight elevated alkaline phosphate of unknown clinical significance.  She can have repeat liver functional test at at next scheduled yearly checkup. ? ?Topamax level was subtherapeutic, please make sure she is compliant with her Topamax prescription ? ?

## 2022-03-12 ENCOUNTER — Telehealth: Payer: Self-pay | Admitting: Neurology

## 2022-03-12 DIAGNOSIS — R55 Syncope and collapse: Secondary | ICD-10-CM

## 2022-03-12 NOTE — Telephone Encounter (Signed)
Sent to [facility], they will reach out to the patient to schedule.  ?

## 2022-03-19 MED ORDER — TOPIRAMATE 100 MG PO TABS: ORAL_TABLET | ORAL | 11 refills | Status: DC

## 2022-03-19 NOTE — Telephone Encounter (Signed)
Pt asking does she need to continue taking topiramate (TOPAMAX) 100 MG tablet during the EEG. Have been taking extra dose to keep from being dizzy and passing out, the extra has calm the episodes down. Would like a call from the nurse to discuss. ?

## 2022-03-19 NOTE — Telephone Encounter (Signed)
Meds ordered this encounter  ?Medications  ? topiramate (TOPAMAX) 100 MG tablet  ?  Sig: One in the morning, 2 at night  ?  Dispense:  90 tablet  ?  Refill:  11  ?Please call patient, it is okay to take Topamax 3 tablets a day, she does not take it 3 times, it is okay to take 1 in the morning/2 at night, new prescription was called in to reflect the change ?

## 2022-03-19 NOTE — Addendum Note (Signed)
Addended by: Marcial Pacas on: 03/19/2022 10:19 AM ? ? Modules accepted: Orders ? ?

## 2022-03-19 NOTE — Telephone Encounter (Signed)
I called patient. She reports that she decided to increase her topamax '100mg'$  from BID to TID. The TID dosing seems to to help her dizzy spells. The video EEG should be set up this week. She is wondering if she should continue her topamax during the 72 hour EEG or stop it since topamax seems to control her episodes. ?

## 2022-03-19 NOTE — Telephone Encounter (Signed)
I called patient. I discussed this with her. She will take topamax '100mg'$  in AM and '200mg'$  in PM. We will call her with EEG results when available. Pt verbalized understanding. ? ?

## 2022-03-22 ENCOUNTER — Ambulatory Visit (INDEPENDENT_AMBULATORY_CARE_PROVIDER_SITE_OTHER): Payer: Medicaid Other | Admitting: Internal Medicine

## 2022-03-22 ENCOUNTER — Encounter: Payer: Self-pay | Admitting: Internal Medicine

## 2022-03-22 VITALS — BP 150/80 | HR 86 | Ht 64.0 in | Wt 206.8 lb

## 2022-03-22 DIAGNOSIS — E0590 Thyrotoxicosis, unspecified without thyrotoxic crisis or storm: Secondary | ICD-10-CM | POA: Diagnosis not present

## 2022-03-22 NOTE — Progress Notes (Signed)
? ? ?Name: [name]  ?MRN/ DOB: [id number], [birth year]    ?Age/ Sex: 41 y.o., female   ? ?PCP: Nolene Ebbs, MD   ?Reason for Endocrinology Evaluation: Hyperthyroidism  ?   ?Date of Initial Endocrinology Evaluation: 03/22/2022   ? ? ?HPI: ?[name] is a 41 y.o. female with a past medical history of Migraine headaches and asthma . The patient presented for initial endocrinology clinic visit on 03/22/2022 for consultative assistance with her Hyperthyroidism.  ? ?Pt has been not with slightly low TSh at 0.333uIU/mL during routine labs 02/2022 ? ? ? ?She was following with [facility]  ? ?She was noted with fluctuating weight  ?She has palpitations  ?She has irregular bowel movements ?She was on No biotin use  ?Denies local neck swelling ?She has anxiety  ?She is S/P hysterectomy  ? ?She was seen by Neurology for dizziness , this is triggered by motion  ? ? ? ?Cousin with thyroid disease  ? ? ? ? ?HISTORY:  ?Past Medical History:  ?Past Medical History:  ?Diagnosis Date  ? Anxiety   ? Arthritis   ? Asthma   ? meds as needed  ? Bronchitis   ? Chronic back pain   ? Ectopic pregnancy   ? requiring surgery  ? Elevated liver enzymes   ? GERD (gastroesophageal reflux disease)   ? Gonorrhea   ? Hypertension   ? Irritable bowel   ? Migraine headache   ? Peripheral vascular disease ([place])   ? Pneumonia   ? 2017  ? Preterm labor   ? Seizures ([place])   ? fever induced during childhood  ? ?Past Surgical History:  ?Past Surgical History:  ?Procedure Laterality Date  ? ABDOMINAL HYSTERECTOMY    ? DILITATION & CURRETTAGE/HYSTROSCOPY WITH NOVASURE ABLATION N/A 02/20/2016  ? Procedure: HYSTEROSCOPY WITH NOVASURE ABLATION;  Surgeon: [name], MD;  Location: [facility];  Service: Gynecology;  Laterality: N/A;  ? LAPAROSCOPIC VAGINAL HYSTERECTOMY WITH SALPINGECTOMY Left 11/18/2017  ? Procedure: LAPAROSCOPIC ASSISTED VAGINAL HYSTERECTOMY WITH SALPINGECTOMY;  Surgeon: Emily Filbert, MD;  Location: [facility];  Service: Gynecology;  Laterality: Left;  ? LAPAROSCOPY  05/30/2012  ? Procedure: LAPAROSCOPY OPERATIVE;  Surgeon: Osborne Oman, MD;  Location: [facility];  Service: Gynecology;  Laterality: N/A;  Operative laparoscopy right salpingectomy and removal of ectopic pregnancy  ? LAPAROSCOPY FOR ECTOPIC PREGNANCY    ? SALPINGECTOMY    ? TUBAL LIGATION  2005  ? WISDOM TOOTH EXTRACTION    ?  ?Social History:  reports that she has been smoking cigarettes. She has a 4.00 pack-year smoking history. She has never used smokeless tobacco. She reports that she does not currently use alcohol after a past usage of about 1.0 standard drink per week. She reports that she does not use drugs. ?Family History: family history includes Asthma in her brother; Breast cancer in her cousin; Prostate cancer in her father. ? ? ?HOME MEDICATIONS: ?Allergies as of 03/22/2022   ? ?   Reactions  ? Latex Hives  ? Raspberry Hives, Itching, Swelling  ? Vioxx [rofecoxib] Hives  ? ?  ? ?  ?Medication List  ?  ? ?  ? Accurate as of March 22, 2022  6:02 PM. If you have any questions, ask your nurse or doctor.  ?  ?  ? ?  ? ?acetaminophen 650 MG CR tablet ?Commonly known as: TYLENOL ?Take 1,300 mg by mouth every 8 (eight) hours as needed for pain. ?  ?  albuterol 108 (90 Base) MCG/ACT inhaler ?Commonly known as: VENTOLIN HFA ?Inhale 1-2 puffs into the lungs every 6 (six) hours as needed for wheezing or shortness of breath. ?  ?dicyclomine 20 MG tablet ?Commonly known as: BENTYL ?Take 1 tablet (20 mg total) by mouth 2 (two) times daily. ?  ?FAMOTIDINE PO ?Take by mouth. ?  ?meclizine 25 MG tablet ?Commonly known as: ANTIVERT ?Take 25 mg by mouth daily as needed. ?  ?ondansetron 4 MG disintegrating tablet ?Commonly known as: ZOFRAN-ODT ?Take 1 tablet (4 mg total) by mouth every 8 (eight) hours as needed for nausea or vomiting. ?  ?rizatriptan 10 MG disintegrating tablet ?Commonly known as: Maxalt-MLT ?Take 1 tablet (10 mg total) by mouth as needed. May repeat  in 2 hours if needed ?  ?topiramate 100 MG tablet ?Commonly known as: TOPAMAX ?One in the morning, 2 at night ?  ?triamterene-hydrochlorothiazide 37.5-25 MG tablet ?Commonly known as: MAXZIDE-25 ?Take 1 tablet by mouth daily. ?  ?Vitamin D (Ergocalciferol) 1.25 MG (50000 UNIT) Caps capsule ?Commonly known as: DRISDOL ?Take 50,000 Units by mouth once a week. ?  ? ?  ?  ? ? ?REVIEW OF SYSTEMS: ?A comprehensive ROS was conducted with the patient and is negative except as per HPI and below:  ?ROS  ? ? ? ?OBJECTIVE:  ?VS: BP (!) 150/80 (BP Location: Right Arm)   Pulse 86   Ht '5\' 4"'  (1.626 m)   Wt 206 lb 12.8 oz (93.8 kg)   LMP 11/14/2017   SpO2 98%   BMI 35.50 kg/m?   ? ?Wt Readings from Last 3 Encounters:  ?03/22/22 206 lb 12.8 oz (93.8 kg)  ?02/25/22 208 lb 8 oz (94.6 kg)  ?01/03/22 203 lb (92.1 kg)  ? ? ? ?EXAM: ?General: Pt appears well and is in NAD  ?Eyes: External eye exam normal without stare, lid lag or exophthalmos.  EOM intact.  PERRL.  ?Neck: General: Supple without adenopathy. ?Thyroid: Thyroid size normal.  No goiter or nodules appreciated. No thyroid bruit.  ?Lungs: Clear with good BS bilat with no rales, rhonchi, or wheezes  ?Heart: Auscultation: RRR.  ?Abdomen: Normoactive bowel sounds, soft, nontender, without masses or organomegaly palpable  ?Extremities:  ?BL LE: No pretibial edema normal ROM and strength.  ?Mental Status: Judgment, insight: Intact ?Orientation: Oriented to time, place, and person ?Mood and affect: No depression, anxiety, or agitation  ? ? ? ?DATA REVIEWED: ? ?  ? Latest Reference Range & Units 02/25/22 11:10  ?TSH 0.450 - 4.500 uIU/mL 0.333 (L)  ? ? Latest Reference Range & Units 02/25/22 11:10  ?Sodium 134 - 144 mmol/L 140  ?Potassium 3.5 - 5.2 mmol/L 3.7  ?Chloride 96 - 106 mmol/L 105  ?CO2 20 - 29 mmol/L 24  ?Glucose 70 - 99 mg/dL 86  ?BUN 6 - 24 mg/dL 7  ?Creatinine 0.57 - 1.00 mg/dL 0.65  ?Calcium 8.7 - 10.2 mg/dL 9.2  ?BUN/Creatinine Ratio 9 - 23  11  ?eGFR >59  mL/min/1.73 113  ?Alkaline Phosphatase 44 - 121 IU/L 164 (H)  ?Albumin 3.8 - 4.8 g/dL 4.1  ?Albumin/Globulin Ratio 1.2 - 2.2  1.6  ?AST 0 - 40 IU/L 7  ?ALT 0 - 32 IU/L 13  ?Total Protein 6.0 - 8.5 g/dL 6.6  ?Total Bilirubin 0.0 - 1.2 mg/dL 0.3  ? ? ?ASSESSMENT/PLAN/RECOMMENDATIONS:  ? ?Subclinical Hyperthyroidism ? ? ?-Patient is clinically euthyroid ?-No local neck symptoms ?-Discussed differential diagnosis of Graves' disease versus autonomous multinodular goiter ?-We did discuss  treatment options with thionamides therapy, but after discussing the side effects the patient opted to wait and watch and intervene when necessary ?-We will proceed with thyroid ultrasound ? ? ? ?Follow-up 6 months ? ? ? ?Signed electronically by: ?Abby Nena Jordan, MD ? ?[facility]  ?[facility] ?[address] ?[place], [place] [address] ?Phone: [phone] ?FAX: [phone] ? ? ?CC: ?Nolene Ebbs, MD ?[address] ?[place] [address] ?Phone: [phone] ?Fax: [phone] ? ? ?Return to Endocrinology clinic as below: ?Future Appointments  ?Date Time Provider [place]  ?09/27/2022  1:00 PM Taniqua Issa, Melanie Crazier, MD [facility] None  ? ?  ? ? ? ? ? ?

## 2022-03-27 DIAGNOSIS — R55 Syncope and collapse: Secondary | ICD-10-CM | POA: Insufficient documentation

## 2022-03-27 NOTE — Addendum Note (Signed)
Addended by: Marcial Pacas on: 03/27/2022 04:39 PM ? ? Modules accepted: Orders ? ?

## 2022-03-28 ENCOUNTER — Other Ambulatory Visit: Payer: Self-pay | Admitting: *Deleted

## 2022-03-28 DIAGNOSIS — R42 Dizziness and giddiness: Secondary | ICD-10-CM

## 2022-03-28 DIAGNOSIS — R55 Syncope and collapse: Secondary | ICD-10-CM

## 2022-04-03 ENCOUNTER — Ambulatory Visit (INDEPENDENT_AMBULATORY_CARE_PROVIDER_SITE_OTHER): Payer: Medicaid Other

## 2022-04-03 DIAGNOSIS — R55 Syncope and collapse: Secondary | ICD-10-CM | POA: Diagnosis not present

## 2022-04-03 DIAGNOSIS — R42 Dizziness and giddiness: Secondary | ICD-10-CM | POA: Diagnosis not present

## 2022-04-06 ENCOUNTER — Telehealth: Payer: Self-pay | Admitting: Student

## 2022-04-06 NOTE — Telephone Encounter (Signed)
? ?  Received page from present [employer] about critical EKG.  Called and spoke with rep.  Rep stated patient had a manual trigger of syncope and fast heart rate this morning at 8:23 AM (central time).  Monitor at that time showed sinus tachycardia with a rate of 105 bpm.  [employer] tried calling the patient but did not get an answer.  I called and spoke with patient. She denies any syncope this morning but did states she was feeling some lightheadedness/dizziness and fluttering in her chest.  She states she still has a little lightheadedness/dizziness but states it has improved.  No chest pain or new shortness of breath.  Advised patient to make sure she was staying well-hydrated.  We will wait for full monitor results for additional recommendations.  Advised patient to let us know if symptoms worsen. She voiced understanding and agreed. ? ?Darreld Mclean, PA-C ?04/06/2022 10:48 AM ? ? ?

## 2022-04-09 ENCOUNTER — Telehealth: Payer: Self-pay

## 2022-04-09 NOTE — Telephone Encounter (Signed)
Received monitor event from [employer] that occurred on 04/06/22 at 8:24 am. Patient stated she felt her heart racing at that time and her BP was in the 130s/70s. She stated she felt her heart racing again last night while laying in bed. Recommended that she stay laying or seated when she feels heart rate racing and to check BP at the time. Dr. Martinique (DOD) reviewed rhythm. No new orders. ?

## 2022-04-11 NOTE — Telephone Encounter (Signed)
It is ok to use R55. ?

## 2022-04-15 NOTE — Telephone Encounter (Signed)
Noted, thank you

## 2022-05-23 ENCOUNTER — Telehealth: Payer: Self-pay

## 2022-05-23 NOTE — Telephone Encounter (Signed)
-----   Message from Janina Mayo, MD sent at 05/21/2022  1:34 PM EDT ----- Please let [facility] know that her ziopatch did not show any concerning rhythm issues.  She had 43 triggered events which is a lot. Mainly for sinus rhythm. Dizziness may be related to BPPV and/or anxiety. Will CC her PCP. Thank you!

## 2022-05-23 NOTE — Telephone Encounter (Signed)
Spoke with patient regarding the following results. Patient made aware and patient verbalized understanding.   Advised patient to call back to office with any issues, questions, or concerns. Patient verbalized understanding.

## 2022-06-12 DIAGNOSIS — R404 Transient alteration of awareness: Secondary | ICD-10-CM | POA: Diagnosis not present

## 2022-07-02 ENCOUNTER — Encounter: Payer: Self-pay | Admitting: Neurology

## 2022-07-02 DIAGNOSIS — R404 Transient alteration of awareness: Secondary | ICD-10-CM

## 2022-07-02 DIAGNOSIS — G43709 Chronic migraine without aura, not intractable, without status migrainosus: Secondary | ICD-10-CM

## 2022-07-02 NOTE — Procedures (Signed)
Clinical History : This is a 41 y/o F who presents with episodes of headaches, dizziness, blurry vision, and passing out.  INTERMITTENT MONITORING with VIDEO TECHNICAL SUMMARY: This AVEEG was performed using equipment provided by [employer] utilizing Bluetooth ( Track[employer] ) amplifiers with continuous EEGT attended video collection using encrypted remote transmission via [place] secured cellular tower network with data rates for each AVEEG performed. 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 Data was acquired with a minimum of 21 bipolar connections and sampled at a minimum rate of 250 cycles per second per channel, maximum rate of 450 cycles per second per channel and two channels for EKG. The entire VEEG study was recorded through cable and or radio telemetry for subsequent analysis. Specified epochs of the AVEEG data were identified at the direction of the subject by the depression of a push button by the patient. Each patients event file included data acquired two minutes prior to the push button activation and continuing until two minutes afterwards. AVEEG files were reviewed on [place], Licensed Software provided by [employer] with a digital high frequency filter set at 70 Hz and a low frequency filter set at 1 Hz with a paper speed of 78m/s resulting in 10 seconds per digital page. This entire AVEEG was reviewed by the EEG Technologist. Random time samples, random sleep samples, clips, patient initiated push button files with included patient daily diary logs, EEG Technologist pruned data was reviewed and verified for accuracy and validity by the governing reading neurologist in full details. This AEEGV was fully compliant with all requirements for CPT 97500 for setup, patient education, take down and administered by an EEG  technologist.  Long-Term EEG with Video was monitored intermittently by a qualified EEG technologist for the entirety of the recording; quality check-ins were performed at a minimum of every two hours, checking and documenting real-time data and video to assure the integrity and quality of the recording (e.g., camera position, electrode integrity and impedance), and identify the need for maintenance. For intermittent monitoring, an EEG Technologist monitored no more than 12 patients concurrently. Diagnostic video was captured at least 80% of the time during the recording.  PATIENT EVENTS: A button press or notation was made 25 times. Patient noted events for top head pain, burning pain over R eye, back of head pain, and stabbing pain. No EEG changes were noted with patient events.  TECHNOLOGIST EVENTS: No clear epileptiform activity was detected by the reviewing neurodiagnostic technologist during the recording for further evaluation.  TIME SAMPLES: 10-minutes of every 2 hours recorded are reviewed as random time samples.  SLEEP SAMPLES: 5-minutes of every 24 hour recorded sleep cycle are reviewed as random sleep samples.  AWAKE: At maximal level of alertness, the posterior dominant background activity was continuous, reactive, low voltage rhythm of 10.5-11 Hz. This wassymmetric, well-modulated, and attenuated with eye opening. Diffuse, symmetric, frontocentral beta range activity was present.  SLEEP: N1 Sleep (Stage 1) was observed and characterized by the disappearance of alpha rhythm and the appearance of vertex activity.  N2 Sleep (Stage 2) was observed and characterized by vertex waves, K-complexes, and sleep spindles.  N3 (Stage 3) sleep was observed and characterized by high amplitude Delta activity of 20%.  REM sleep was observed.  EKG: There were no arrhythmias or abnormalities noted during this recording.   Impression: This is a normal ambulatory EEG. No seizures  and no  epileptiform discharges captured. There were more than 25 press button events for headaches, top of the head pain, back of the head pain, back pain and burning pain right eye without any EEG correlate.    Alric Ran, MD [facility]

## 2022-07-21 ENCOUNTER — Emergency Department (HOSPITAL_COMMUNITY): Payer: Medicaid Other

## 2022-07-21 ENCOUNTER — Emergency Department (HOSPITAL_COMMUNITY)
Admission: EM | Admit: 2022-07-21 | Discharge: 2022-07-21 | Disposition: A | Discharge status: Home / Self Care | Payer: Medicaid Other | Attending: Emergency Medicine | Admitting: Emergency Medicine

## 2022-07-21 ENCOUNTER — Encounter (HOSPITAL_COMMUNITY): Payer: Self-pay

## 2022-07-21 ENCOUNTER — Other Ambulatory Visit: Payer: Self-pay

## 2022-07-21 DIAGNOSIS — S0990XA Unspecified injury of head, initial encounter: Secondary | ICD-10-CM | POA: Insufficient documentation

## 2022-07-21 DIAGNOSIS — Z79899 Other long term (current) drug therapy: Secondary | ICD-10-CM | POA: Insufficient documentation

## 2022-07-21 DIAGNOSIS — R519 Headache, unspecified: Secondary | ICD-10-CM

## 2022-07-21 DIAGNOSIS — I10 Essential (primary) hypertension: Secondary | ICD-10-CM | POA: Diagnosis not present

## 2022-07-21 DIAGNOSIS — W228XXA Striking against or struck by other objects, initial encounter: Secondary | ICD-10-CM | POA: Insufficient documentation

## 2022-07-21 DIAGNOSIS — Z91040 Latex allergy status: Secondary | ICD-10-CM | POA: Insufficient documentation

## 2022-07-21 MED ORDER — DEXAMETHASONE SODIUM PHOSPHATE 10 MG/ML IJ SOLN
10.0000 mg | Freq: Once | INTRAMUSCULAR | Status: AC
  Administered 2022-07-21: 10 mg via INTRAVENOUS
  Filled 2022-07-21: qty 1

## 2022-07-21 MED ORDER — METOCLOPRAMIDE HCL 5 MG/ML IJ SOLN
10.0000 mg | Freq: Once | INTRAMUSCULAR | Status: AC
  Administered 2022-07-21: 10 mg via INTRAVENOUS
  Filled 2022-07-21: qty 2

## 2022-07-21 MED ORDER — KETOROLAC TROMETHAMINE 15 MG/ML IJ SOLN
15.0000 mg | Freq: Once | INTRAMUSCULAR | Status: AC
  Administered 2022-07-21: 15 mg via INTRAVENOUS
  Filled 2022-07-21: qty 1

## 2022-07-21 MED ORDER — LACTATED RINGERS IV BOLUS
1000.0000 mL | Freq: Once | INTRAVENOUS | Status: AC
  Administered 2022-07-21: 1000 mL via INTRAVENOUS

## 2022-07-21 NOTE — ED Triage Notes (Signed)
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 Patient states she had LOC after hitting her head. Patient states she had dizziness and feeling like she was going to pass out throughout the day.  Patient states she felt tired yesterday.  Today, she has a headache and light sensitivity. MAE.

## 2022-07-21 NOTE — Discharge Instructions (Addendum)
You were seen in the emergency department for evaluation of your headache.  Please follow-up with [facility] intact this discharge paperwork.  Please call to schedule an appointment on Monday.  Please take Tylenol or ibuprofen as needed for pain.  Please make sure you are staying well-hydrated with plenty of fluids, mainly water.  I have included return precautions into the discharge paperwork in addition to the ones we discussed.  If you have any concern, new or worsening symptoms, please return to the nearest emergency department for reevaluation.  Get help right away if: You have: A very bad headache that is not helped by medicine. Trouble walking or weakness in your arms and legs. Clear or bloody fluid coming from your nose or ears. Changes in how you see (vision). A seizure. More confusion or more grumpy moods. Your symptoms get worse. You are sleepier than normal and have trouble staying awake. You lose your balance. The black centers of your eyes (pupils) change in size. Your speech is slurred. Your dizziness gets worse. You vomit. These symptoms may be an emergency. Do not wait to see if the symptoms will go away. Get medical help right away. Call your local emergency services (911 in the [place] Do not drive yourself to the hospital.

## 2022-07-21 NOTE — ED Notes (Signed)
Patient given discharge instructions, all questions answered. Patient in possession of all belongings, directed to the discharge area  

## 2022-07-21 NOTE — ED Provider Triage Note (Signed)
Emergency Medicine Provider Triage Evaluation Note  [name] , a 41 y.o. female  was evaluated in triage.  Pt complains of head pain after an injury. The patient reports that she hit her head on a lift on the bus and had a syncopal episode onto a chair afterwards. She has been having head pain and photophobia since. Denies any blood thinner use.  Review of Systems  Positive:  Negative:   Physical Exam  BP (!) 148/91 (BP Location: Left Arm)   Pulse 80   Temp 98.7 F (37.1 C) (Oral)   Resp 16   LMP 11/14/2017   SpO2 100%  Gen:   Awake, no distress   Resp:  Normal effort  MSK:   Moves all extremities without difficulty  Other:  Cranial nerves grossly intact. Normal speech  Medical Decision Making  Medically screening exam initiated at 11:30 AM.  Appropriate orders placed.  [name] was informed that the remainder of the evaluation will be completed by another provider, this initial triage assessment does not replace that evaluation, and the importance of remaining in the ED until their evaluation is complete.  Will order Head and Neck CT   Sherrell Puller, PA-C 07/21/22 1131

## 2022-07-21 NOTE — ED Provider Notes (Signed)
[facility] DEPT Provider Note   CSN: [id number] Arrival date & time: 07/21/22  1115     History Chief Complaint  Patient presents with   Head Injury   Loss of Consciousness    [name] is a 41 y.o. female with history of anxiety, GERD, hypertension, migraine headaches, seizures presents the emergency department for evaluation of head injury with loss of consciousness.  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  She reports that she hit her head on the lip of the bus ceiling and lost consciousness over a bus seat.  She reports that she went to work yesterday but today she has been having some pain to her head as well as some photophobia.  Denies any unilateral weakness.  Denies any ringing in her ears.  Denies any chest pain or shortness of breath.   Head Injury Associated symptoms: headache   Associated symptoms: no nausea, no neck pain, no numbness, no seizures and no vomiting   Loss of Consciousness Associated symptoms: headaches   Associated symptoms: no chest pain, no dizziness, no fever, no nausea, no seizures, no shortness of breath, no vomiting and no weakness        Home Medications Prior to Admission medications   Medication Sig Start Date End Date Taking? Authorizing Provider  acetaminophen (TYLENOL) 650 MG CR tablet Take 1,300 mg by mouth every 8 (eight) hours as needed for pain.    [provider]  albuterol (VENTOLIN HFA) 108 (90 Base) MCG/ACT inhaler Inhale 1-2 puffs into the lungs every 6 (six) hours as needed for wheezing or shortness of breath. Patient not taking: Reported on 03/22/2022 11/09/19   Ok Edwards, PA-C  dicyclomine (BENTYL) 20 MG tablet Take 1 tablet (20 mg total) by mouth 2 (two) times daily. Patient not taking: Reported on 03/22/2022 10/15/20   [place] Lanius A, PA-C  FAMOTIDINE PO Take by  mouth. Patient not taking: Reported on 03/22/2022    [provider]  meclizine (ANTIVERT) 25 MG tablet Take 25 mg by mouth daily as needed. 11/09/21   [provider]  ondansetron (ZOFRAN-ODT) 4 MG disintegrating tablet Take 1 tablet (4 mg total) by mouth every 8 (eight) hours as needed for nausea or vomiting. 11/23/21   Marcial Pacas, MD  rizatriptan (MAXALT-MLT) 10 MG disintegrating tablet Take 1 tablet (10 mg total) by mouth as needed. May repeat in 2 hours if needed 11/23/21   Marcial Pacas, MD  topiramate (TOPAMAX) 100 MG tablet One in the morning, 2 at night 03/19/22   Marcial Pacas, MD  triamterene-hydrochlorothiazide (MAXZIDE-25) 37.5-25 MG tablet Take 1 tablet by mouth daily. 11/09/21   [provider]  Vitamin D, Ergocalciferol, (DRISDOL) 1.25 MG (50000 UNIT) CAPS capsule Take 50,000 Units by mouth once a week. 11/09/21   [provider]      Allergies    Latex, Raspberry, and Vioxx [rofecoxib]    Review of Systems   Review of Systems  Constitutional:  Negative for chills and fever.  Respiratory:  Negative for shortness of breath.   Cardiovascular:  Positive for syncope. Negative for chest pain.  Gastrointestinal:  Negative for abdominal pain, constipation, nausea and vomiting.  Musculoskeletal:  Negative for back pain and neck pain.  Neurological:  Positive for light-headedness and headaches. Negative for dizziness, seizures, syncope, speech difficulty, weakness and numbness.  Physical Exam Updated Vital Signs BP (!) 151/113 Comment: did not take BP meds today  Pulse 66   Temp 98.3 F (36.8 C) (Oral)   Resp 16   Ht 5' 4.5" (1.638 m)   Wt 90.7 kg   LMP 11/14/2017   SpO2 99%   BMI 33.80 kg/m  Physical Exam Vitals and nursing note reviewed.  Constitutional:      General: She is not in acute distress.    Appearance: Normal appearance. She is not ill-appearing or toxic-appearing.     Comments: Wearing sunglasses  HENT:     Head: Normocephalic  and atraumatic.     Nose: Nose normal.     Mouth/Throat:     Mouth: Mucous membranes are moist.  Eyes:     General: No scleral icterus.    Extraocular Movements: Extraocular movements intact.     Pupils: Pupils are equal, round, and reactive to light.  Neck:     Comments: No step-off or deformity.  No signs of trauma.  No tenderness. Cardiovascular:     Rate and Rhythm: Normal rate and regular rhythm.  Pulmonary:     Effort: Pulmonary effort is normal.     Breath sounds: Normal breath sounds.  Musculoskeletal:        General: No deformity.     Cervical back: Normal range of motion. No tenderness.  Skin:    General: Skin is warm and dry.  Neurological:     General: No focal deficit present.     Mental Status: She is alert. Mental status is at baseline.     GCS: GCS eye subscore is 4. GCS verbal subscore is 5. GCS motor subscore is 6.     Cranial Nerves: Cranial nerves 2-12 are intact. No cranial nerve deficit, dysarthria or facial asymmetry.     Sensory: No sensory deficit.     Motor: No weakness or pronator drift.     Coordination: Finger-Nose-Finger Test normal.     Comments: Patient is alert and oriented.  GCS 15.  Cranial nerves II through XII intact.  Patient is answering questions appropriate with appropriate speech.  No facial asymmetry.  Visual fields grossly intact.  Sensation intact throughout.  Patient strength is symmetric in patient's upper and lower bilateral extremities.  No pronator drift.  Normal finger-nose.  Normal gait.     ED Results / Procedures / Treatments   Labs (all labs ordered are listed, but only abnormal results are displayed) Labs Reviewed - No data to display  EKG None  Radiology CT Head Wo Contrast  Result Date: 07/21/2022 CLINICAL DATA:  Head trauma, moderate-severe; Neck trauma, dangerous injury mechanism (Age 21-64y) EXAM: CT HEAD WITHOUT CONTRAST CT CERVICAL SPINE WITHOUT CONTRAST TECHNIQUE: Multidetector CT imaging of the head and  cervical spine was performed following the standard protocol without intravenous contrast. Multiplanar CT image reconstructions of the cervical spine were also generated. RADIATION DOSE REDUCTION: This exam was performed according to the departmental dose-optimization program which includes automated exposure control, adjustment of the mA and/or kV according to patient size and/or use of iterative reconstruction technique. COMPARISON:  11/27/2021 FINDINGS: CT HEAD FINDINGS Brain: No evidence of acute infarction, hemorrhage, hydrocephalus, extra-axial collection or mass lesion/mass effect. Vascular: No hyperdense vessel or unexpected calcification. Skull: Normal. Negative for fracture or focal lesion. Sinuses/Orbits: No acute finding. Other: Negative for scalp hematoma. CT CERVICAL SPINE FINDINGS Alignment: Facet joints are aligned without dislocation or traumatic listhesis. Dens and lateral masses are aligned. Straightening of the  cervical lordosis. Skull base and vertebrae: No acute fracture. No primary bone lesion or focal pathologic process. Soft tissues and spinal canal: No prevertebral fluid or swelling. No visible canal hematoma. Disc levels: Intervertebral disc heights are preserved. Mild endplate spurring within the mid to lower cervical spine. No advanced facet joint arthropathy. Upper chest: Negative. Other: None. IMPRESSION: 1. No acute intracranial abnormality. 2. No acute cervical spine fracture or subluxation. 3. Straightening of the cervical lordosis, which may be secondary to positioning or muscle spasm. Electronically Signed   By: Davina Poke D.O.   On: 07/21/2022 12:01   CT Cervical Spine Wo Contrast  Result Date: 07/21/2022 CLINICAL DATA:  Head trauma, moderate-severe; Neck trauma, dangerous injury mechanism (Age 55-64y) EXAM: CT HEAD WITHOUT CONTRAST CT CERVICAL SPINE WITHOUT CONTRAST TECHNIQUE: Multidetector CT imaging of the head and cervical spine was performed following the standard  protocol without intravenous contrast. Multiplanar CT image reconstructions of the cervical spine were also generated. RADIATION DOSE REDUCTION: This exam was performed according to the departmental dose-optimization program which includes automated exposure control, adjustment of the mA and/or kV according to patient size and/or use of iterative reconstruction technique. COMPARISON:  11/27/2021 FINDINGS: CT HEAD FINDINGS Brain: No evidence of acute infarction, hemorrhage, hydrocephalus, extra-axial collection or mass lesion/mass effect. Vascular: No hyperdense vessel or unexpected calcification. Skull: Normal. Negative for fracture or focal lesion. Sinuses/Orbits: No acute finding. Other: Negative for scalp hematoma. CT CERVICAL SPINE FINDINGS Alignment: Facet joints are aligned without dislocation or traumatic listhesis. Dens and lateral masses are aligned. Straightening of the cervical lordosis. Skull base and vertebrae: No acute fracture. No primary bone lesion or focal pathologic process. Soft tissues and spinal canal: No prevertebral fluid or swelling. No visible canal hematoma. Disc levels: Intervertebral disc heights are preserved. Mild endplate spurring within the mid to lower cervical spine. No advanced facet joint arthropathy. Upper chest: Negative. Other: None. IMPRESSION: 1. No acute intracranial abnormality. 2. No acute cervical spine fracture or subluxation. 3. Straightening of the cervical lordosis, which may be secondary to positioning or muscle spasm. Electronically Signed   By: Davina Poke D.O.   On: 07/21/2022 12:01    Procedures Procedures   Medications Ordered in ED Medications  ketorolac (TORADOL) 15 MG/ML injection 15 mg (15 mg Intravenous Given 07/21/22 1422)  dexamethasone (DECADRON) injection 10 mg (10 mg Intravenous Given 07/21/22 1420)  metoCLOPramide (REGLAN) injection 10 mg (10 mg Intravenous Given 07/21/22 1422)  lactated ringers bolus 1,000 mL (0 mLs Intravenous Stopped  07/21/22 1611)    ED Course/ Medical Decision Making/ A&P                           Medical Decision Making Amount and/or Complexity of Data Reviewed Radiology: ordered.  Risk Prescription drug management.    41 year old female presents the emergency department for evaluation of head injury and headache.  Differential diagnosis includes was not limited to skull fracture, contusion, intracranial bleed, migraine, concussion.  Vital signs show mildly elevated blood pressure, otherwise normal.  Physical exam shows no neurological deficit.  Physical exam is reassuring.  Please see above.  Did order head CT and CT of neck to rule out any trauma.  CT imaging shows 1. No acute intracranial abnormality. 2. No acute cervical spine fracture or subluxation. 3. Straightening of the cervical lordosis, which may be secondary to positioning or muscle spasm.   This is likely a migraine onset from her head injury, will order  Patient declined labs.   Reassess neurological exam, unchanged.  Patient likely has a migraine onset from hitting her head given her history.  She denies any seizure-like activity reports she is compliant with her medications.  I likely think that this is a concussion.  I will have her follow-up with [facility] for further evaluation.  I gave her work note for a few days off of work. The patient asked for a note off through Friday since she will be on vacation, will give her one through Thursday. I gave her return precautions and a referral.  Discussed that she will need to call to schedule an appointment.  We discussed return precautions red flag symptoms.  Patient verbalized understanding and agrees to the plan.  Patient is stable and being discharged home in good condition.  I discussed this case with my attending physician who cosigned this note including patient's presenting symptoms, physical exam, and planned diagnostics and interventions.  Attending physician stated agreement with plan or made changes to plan which were implemented.   Final Clinical Impression(s) / ED Diagnoses Final diagnoses:  Injury of head, initial encounter  Bad headache    Rx / DC Orders ED Discharge Orders     None         Sherrell Puller, PA-C 07/21/22 1755    Fransico Meadow, MD 07/22/22 [phone]

## 2022-07-26 ENCOUNTER — Ambulatory Visit: Payer: Medicaid Other | Admitting: Family Medicine

## 2022-07-26 ENCOUNTER — Other Ambulatory Visit: Payer: Self-pay

## 2022-07-26 ENCOUNTER — Emergency Department (HOSPITAL_COMMUNITY)
Admission: EM | Admit: 2022-07-26 | Discharge: 2022-07-27 | Disposition: A | Discharge status: Home / Self Care | Payer: Medicaid Other | Attending: Emergency Medicine | Admitting: Emergency Medicine

## 2022-07-26 ENCOUNTER — Encounter (HOSPITAL_COMMUNITY): Payer: Self-pay

## 2022-07-26 DIAGNOSIS — R209 Unspecified disturbances of skin sensation: Secondary | ICD-10-CM | POA: Insufficient documentation

## 2022-07-26 DIAGNOSIS — M545 Low back pain, unspecified: Secondary | ICD-10-CM | POA: Insufficient documentation

## 2022-07-26 DIAGNOSIS — R42 Dizziness and giddiness: Secondary | ICD-10-CM | POA: Insufficient documentation

## 2022-07-26 DIAGNOSIS — M542 Cervicalgia: Secondary | ICD-10-CM | POA: Diagnosis not present

## 2022-07-26 DIAGNOSIS — F0781 Postconcussional syndrome: Secondary | ICD-10-CM | POA: Diagnosis not present

## 2022-07-26 DIAGNOSIS — Z91040 Latex allergy status: Secondary | ICD-10-CM | POA: Diagnosis not present

## 2022-07-26 LAB — BASIC METABOLIC PANEL
Anion gap: 4 — ABNORMAL LOW (ref 5–15)
BUN: 9 mg/dL (ref 6–20)
CO2: 20 mmol/L — ABNORMAL LOW (ref 22–32)
Calcium: 8.7 mg/dL — ABNORMAL LOW (ref 8.9–10.3)
Chloride: 113 mmol/L — ABNORMAL HIGH (ref 98–111)
Creatinine, Ser: 0.78 mg/dL (ref 0.44–1.00)
GFR, Estimated: >60 mL/min (ref >60)
Glucose, Bld: 104 mg/dL — ABNORMAL HIGH (ref 70–99)
Potassium: 3.8 mmol/L (ref 3.5–5.1)
Sodium: 137 mmol/L (ref 135–145)

## 2022-07-26 LAB — CBC
HCT: 42 % (ref 36.0–46.0)
Hemoglobin: 13.8 g/dL (ref 12.0–15.0)
MCH: 29.8 pg (ref 26.0–34.0)
MCHC: 32.9 g/dL (ref 30.0–36.0)
MCV: 90.7 fL (ref 80.0–100.0)
Platelets: 243 10*3/uL (ref 150–400)
RBC: 4.63 MIL/uL (ref 3.87–5.11)
RDW: 12.8 % (ref 11.5–15.5)
WBC: 10.4 10*3/uL (ref 4.0–10.5)
nRBC: 0 % (ref 0.0–0.2)

## 2022-07-26 MED ORDER — DROPERIDOL 2.5 MG/ML IJ SOLN
1.2500 mg | Freq: Once | INTRAMUSCULAR | Status: AC
Start: 2022-07-26 — End: 2022-07-26
  Administered 2022-07-26: 1.25 mg via INTRAVENOUS
  Filled 2022-07-26: qty 2

## 2022-07-26 NOTE — Progress Notes (Deleted)
Subjective:   I, Peterson Lombard, LAT, ATC acting as a scribe for Lynne Leader, MD.  Chief Complaint: [name],  is a 41 y.o. female who presents for initial evaluation of a head injury. 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 + LOC. Pt was seen at the [facility] ED on 07/21/22 c/o HA, photophobia, and lightheadedness. Today, pt reports  Dx imaging: 07/21/22 Head & C-spine CT  Injury date : 07/19/22 Visit #: 1  History of Present Illness:   Concussion Self-Reported Symptom Score Symptoms rated on a scale 1-6, in last 24 hours   Headache: ***    Nausea: ***  Dizziness: ***  Vomiting: ***  Balance Difficulty: ***   Trouble Falling Asleep: ***   Fatigue: ***  Sleep Less Than Usual: ***  Daytime Drowsiness: ***  Sleep More Than Usual: ***  Photophobia: ***  Phonophobia: ***  Irritability: ***  Sadness: ***  Numbness or Tingling: ***  Nervousness: ***  Feeling More Emotional: ***  Feeling Mentally Foggy: ***  Feeling Slowed Down: ***  Memory Problems: ***  Difficulty Concentrating: ***  Visual Problems: ***  Total # of Symptoms: /22 Total Symptom Score: ***  Neck Pain: Yes/No Tinnitus: Yes/No  Review of Systems:  ***    Review of History: ***  Objective:    Physical Examination There were no vitals filed for this visit. MSK:  *** Neuro: *** Psych: ***     Imaging:  ***  Assessment and Plan   41 y.o. female with ***    ***    Action/Discussion: Reviewed diagnosis, management options, expected outcomes, and the reasons for scheduled and emergent follow-up. Questions were adequately answered. Patient expressed verbal understanding and agreement with the following plan.     Patient Education: Reviewed with patient the risks (i.e, a repeat concussion, post-concussion syndrome, second-impact syndrome) of returning to play prior to complete resolution,  and thoroughly reviewed the signs and symptoms of concussion.Reviewed need for complete resolution of all symptoms, with rest AND exertion, prior to return to play. Reviewed red flags for urgent medical evaluation: worsening symptoms, nausea/vomiting, intractable headache, musculoskeletal changes, focal neurological deficits. [facility]'s Concussion Care Plan, which clearly outlines the plans stated above, was given to patient.   Level of service: ***     After Visit Summary printed out and provided to patient as appropriate.  The above documentation has been reviewed and is accurate and complete Mare Ferrari

## 2022-07-26 NOTE — Discharge Instructions (Addendum)
Your symptoms is likely due to postconcussive symptoms.  Please follow-up with your neurologist for outpatient management of your condition.

## 2022-07-26 NOTE — ED Provider Notes (Signed)
[facility] EMERGENCY DEPARTMENT Provider Note   CSN: [id number] Arrival date & time: 07/26/22  1912     History  Chief Complaint  Patient presents with   Dizziness    [name] is a 41 y.o. female.  The history is provided by the patient and medical records. No language interpreter was used.  Dizziness  [name] is a 41 yo F w/ hx of syncopes and multiple concussions presents to ED for headache, tingling and numbness of the extemities. Patient was seen at [facility] long on 7/30 after her head injury w/ LOC. Her CT scan of head and spine during that visit were without abnormal findings. Today she presents with generalized head pain that is sharp. She endorses dizziness, blurry and vision loss. She is feeling numbness and tingling along her upper and lower extremities. At times, she reports she can't move them, like she's paralyzed. She has had symptoms like this before with her first concussion at 41 yo. She denies chest pain, SOB, vomitting but does report nausea that comes on with the head ache. She also complains of sharp burning radicular pain from her neck to her back. She reports that she has a known pinched nerve in that area. Tearfully, she reports being unable to sleep and get comfortable d/t the pain and discomfort.  At the time of exam, she is neurologically intact and is able to move all extremities; all strength and sensation is intact. She endorses sharp pain around her head on palpation. She reports pain along the cervical, thoracic, and lumbar region of her spine as well as paraspinal muscles on palpation.    Home Medications Prior to Admission medications   Medication Sig Start Date End Date Taking? Authorizing Provider  acetaminophen (TYLENOL) 650 MG CR tablet Take 1,300 mg by mouth every 8 (eight) hours as needed for pain.    [provider]  albuterol (VENTOLIN HFA) 108 (90 Base) MCG/ACT inhaler Inhale 1-2 puffs into the lungs every 6  (six) hours as needed for wheezing or shortness of breath. Patient not taking: Reported on 03/22/2022 11/09/19   Ok Edwards, PA-C  dicyclomine (BENTYL) 20 MG tablet Take 1 tablet (20 mg total) by mouth 2 (two) times daily. Patient not taking: Reported on 03/22/2022 10/15/20   [place]idence Lanius A, PA-C  FAMOTIDINE PO Take by mouth. Patient not taking: Reported on 03/22/2022    [provider]  meclizine (ANTIVERT) 25 MG tablet Take 25 mg by mouth daily as needed. 11/09/21   [provider]  ondansetron (ZOFRAN-ODT) 4 MG disintegrating tablet Take 1 tablet (4 mg total) by mouth every 8 (eight) hours as needed for nausea or vomiting. 11/23/21   Marcial Pacas, MD  rizatriptan (MAXALT-MLT) 10 MG disintegrating tablet Take 1 tablet (10 mg total) by mouth as needed. May repeat in 2 hours if needed 11/23/21   Marcial Pacas, MD  topiramate (TOPAMAX) 100 MG tablet One in the morning, 2 at night 03/19/22   Marcial Pacas, MD  triamterene-hydrochlorothiazide (MAXZIDE-25) 37.5-25 MG tablet Take 1 tablet by mouth daily. 11/09/21   [provider]  Vitamin D, Ergocalciferol, (DRISDOL) 1.25 MG (50000 UNIT) CAPS capsule Take 50,000 Units by mouth once a week. 11/09/21   [provider]      Allergies    Latex, Raspberry, and Vioxx [rofecoxib]    Review of Systems   Review of Systems  Neurological:  Positive for dizziness.  All other systems reviewed and are negative.  Physical Exam Updated Vital Signs BP (!) 147/93 (BP Location: Left Arm)   Pulse 68   Temp 98.4 F (36.9 C) (Oral)   Resp 16   Ht 5' 4.5" (1.638 m)   Wt 90.7 kg   LMP 11/14/2017   SpO2 99%   BMI 33.80 kg/m  Physical Exam Vitals and nursing note reviewed.  Constitutional:      General: She is not in acute distress.    Appearance: She is well-developed.  HENT:     Head: Atraumatic.  Eyes:     Extraocular Movements: Extraocular movements intact.     Conjunctiva/sclera: Conjunctivae normal.     Pupils: Pupils  are equal, round, and reactive to light.  Cardiovascular:     Rate and Rhythm: Normal rate.  Pulmonary:     Effort: Pulmonary effort is normal.  Abdominal:     Palpations: Abdomen is soft.  Musculoskeletal:     Cervical back: Normal range of motion and neck supple. No rigidity or tenderness.  Skin:    Findings: No rash.  Neurological:     Mental Status: She is alert and oriented to person, place, and time.     GCS: GCS eye subscore is 4. GCS verbal subscore is 5. GCS motor subscore is 6.     Cranial Nerves: Cranial nerves 2-12 are intact.     Sensory: Sensation is intact.     Motor: Motor function is intact.     Coordination: Coordination is intact.  Psychiatric:        Mood and Affect: Mood normal.     ED Results / Procedures / Treatments   Labs (all labs ordered are listed, but only abnormal results are displayed) Labs Reviewed  BASIC METABOLIC PANEL - Abnormal; Notable for the following components:      Result Value   Chloride 113 (*)    CO2 20 (*)    Glucose, Bld 104 (*)    Calcium 8.7 (*)    Anion gap 4 (*)    All other components within normal limits  CBC  URINALYSIS, ROUTINE W REFLEX MICROSCOPIC  CBG MONITORING, ED    EKG EKG Interpretation  Date/Time:  Friday July 26 2022 20:03:37 EDT Ventricular Rate:  74 PR Interval:  146 QRS Duration: 70 QT Interval:  366 QTC Calculation: 406 R Axis:   6 Text Interpretation: Normal sinus rhythm Minimal voltage criteria for LVH, may be normal variant ( R in aVL ) Borderline ECG When compared with ECG of 27-Nov-2021 19:59, PREVIOUS ECG IS PRESENT Confirmed by Georgina Snell ([id number]) on 07/26/2022 8:28:53 PM  Radiology No results found.  Procedures Procedures    Medications Ordered in ED Medications - No data to display  ED Course/ Medical Decision Making/ A&P                           Medical Decision Making Amount and/or Complexity of Data Reviewed Labs: ordered.  Risk Prescription drug  management.   BP (!) 147/93 (BP Location: Left Arm)   Pulse 68   Temp 98.4 F (36.9 C) (Oral)   Resp 16   Ht 5' 4.5" (1.638 m)   Wt 90.7 kg   LMP 11/14/2017   SpO2 99%   BMI 33.80 kg/m   48:14 PM 41 year old female with significant history of migraine, hypertension, anxiety, chronic back pain brought here via EMS with complaints of headache and dizziness.  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  Since then she has had throbbing headache, feeling more emotional than usual, having crying spells, intermittently noticed vision would go in and now, also experiencing numbness and weakness to her upper and lower extremities that comes and goes.  She was seen in the ED 2 days after the accident and had had CT scan of the head and cervical spine done and subsequently was treated and give referral to be seen by concussion clinic.  She did reach out to the concussion clinic however she mention Medicaid does not allow patient to have 2 "doctors" to care for the same symptoms at the same time.  She does have a neurologist.  She mention she has been feeling more emotional having crying spells, having change in her vision and thus prompting this ER visit.  She endorsed nausea without vomiting.  She does not endorse any fever or chills.  On exam patient appears to be in no acute discomfort.  She was talking to her daughter on the phone.  She is able to move all 4 extremities.  She does not have any focal neurodeficit.  She does not have any significant signs of trauma about her head and cervical spine.  She is mentating appropriately.  She is alert and oriented x3.  Her symptoms are suggestive of postconcussive syndrome.  Will provide droperidol to help alleviate her headache.  At this time I do not think repeat imaging such as head and  cervical spine CT will be helpful or would you any additional information.  I do not think other advanced imaging such as MRI is indicated.  I have low suspicion for stroke or cord compression.  Labs and EKG independently viewed interpreted by me and are reassuring.  11:54 PM After receiving treatment patient reportedly much better.  Headache resolved, and sensation is back to normal.  At this time patient is stable to be discharged.  Recommend outpatient follow-up with neurology or with concussion clinic for further care.        Final Clinical Impression(s) / ED Diagnoses Final diagnoses:  Post concussion syndrome    Rx / DC Orders ED Discharge Orders     None         Domenic Moras, PA-C 07/26/22 2354    Elgie Congo, MD 07/27/22 [phone]

## 2022-07-26 NOTE — ED Triage Notes (Signed)
Pt BIB [facility] from home c/o dizziness and "feeling paralyzed". Pt recently discharged from [facility] with a concussion. A&Ox4, VSS

## 2022-07-27 NOTE — ED Notes (Signed)
Patient verbalizes understanding of d/c instructions. Opportunities for questions and answers were provided. Pt d/c from ED and ambulated to lobby where family will be picking pt up.

## 2022-07-30 NOTE — Progress Notes (Unsigned)
Subjective:   I, Peterson Lombard, LAT, ATC acting as a scribe for Lynne Leader, MD.  Chief Complaint: [name],  is a 41 y.o. female who presents for initial evaluation of a head injury. 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 + LOC. Pt was seen at the [facility] ED on 07/21/22 c/o HA, photophobia, and lightheadedness. Pt was seen again at the ED [facility]) on 8/4, transported via EMS, c/o HA, numbness/tingling in both UE and LE, emotional, nausea, and blurred vision. Pt was given droperidol and labs and EKG were obtained. Today, pt reports  Dx imaging: 07/21/22 Head & C-spine CT  Injury date : 07/19/22 Visit #: 1  History of Present Illness:   Concussion Self-Reported Symptom Score Symptoms rated on a scale 1-6, in last 24 hours   Headache: ***    Nausea: ***  Dizziness: ***  Vomiting: ***  Balance Difficulty: ***   Trouble Falling Asleep: ***   Fatigue: ***  Sleep Less Than Usual: ***  Daytime Drowsiness: ***  Sleep More Than Usual: ***  Photophobia: ***  Phonophobia: ***  Irritability: ***  Sadness: ***  Numbness or Tingling: ***  Nervousness: ***  Feeling More Emotional: ***  Feeling Mentally Foggy: ***  Feeling Slowed Down: ***  Memory Problems: ***  Difficulty Concentrating: ***  Visual Problems: ***  Total # of Symptoms: /22  Total Symptom Score: ***  Neck Pain: Yes/No Tinnitus: Yes/No  Review of Systems:  ***    Review of History: ***  Objective:    Physical Examination There were no vitals filed for this visit. MSK:  *** Neuro: *** Psych: ***     Imaging:  ***  Assessment and Plan   41 y.o. female with ***    ***    Action/Discussion: Reviewed diagnosis, management options, expected outcomes, and the reasons for scheduled and emergent follow-up. Questions were adequately answered. Patient expressed verbal understanding and agreement  with the following plan.     Patient Education: Reviewed with patient the risks (i.e, a repeat concussion, post-concussion syndrome, second-impact syndrome) of returning to play prior to complete resolution, and thoroughly reviewed the signs and symptoms of concussion.Reviewed need for complete resolution of all symptoms, with rest AND exertion, prior to return to play. Reviewed red flags for urgent medical evaluation: worsening symptoms, nausea/vomiting, intractable headache, musculoskeletal changes, focal neurological deficits. [facility]'s Concussion Care Plan, which clearly outlines the plans stated above, was given to patient.   Level of service: ***     After Visit Summary printed out and provided to patient as appropriate.  The above documentation has been reviewed and is accurate and complete Mare Ferrari

## 2022-07-31 ENCOUNTER — Other Ambulatory Visit: Payer: Self-pay | Admitting: Family Medicine

## 2022-07-31 ENCOUNTER — Ambulatory Visit (INDEPENDENT_AMBULATORY_CARE_PROVIDER_SITE_OTHER): Payer: Medicaid Other | Admitting: Family Medicine

## 2022-07-31 VITALS — BP 150/92 | HR 70 | Ht 64.5 in | Wt 203.6 lb

## 2022-07-31 DIAGNOSIS — M542 Cervicalgia: Secondary | ICD-10-CM

## 2022-07-31 DIAGNOSIS — G43719 Chronic migraine without aura, intractable, without status migrainosus: Secondary | ICD-10-CM

## 2022-07-31 DIAGNOSIS — S060X0A Concussion without loss of consciousness, initial encounter: Secondary | ICD-10-CM | POA: Diagnosis not present

## 2022-07-31 DIAGNOSIS — F0781 Postconcussional syndrome: Secondary | ICD-10-CM

## 2022-07-31 DIAGNOSIS — R29898 Other symptoms and signs involving the musculoskeletal system: Secondary | ICD-10-CM | POA: Diagnosis not present

## 2022-07-31 MED ORDER — TOPIRAMATE ER 200 MG PO CAP24
200.0000 mg | ORAL_CAPSULE | Freq: Every day | ORAL | 2 refills | Status: DC
Start: 2022-07-31 — End: 2022-07-31

## 2022-07-31 NOTE — Telephone Encounter (Signed)
Looks like we will have to do a prior authorization for Trokendi.  Phone number to call is [phone].  Patient is already tried amitriptyline and Topamax immediate release.  Daily migraine headaches.  Intolerable side effects with immediate release Topamax.

## 2022-07-31 NOTE — Telephone Encounter (Signed)
Faxed with clinicals

## 2022-07-31 NOTE — Patient Instructions (Addendum)
Thank you for coming in today.   I've referred you to Vestibular Therapy.  Let us know if you don't hear from them in one week.   You should hear from MRI scheduling within 1 week. If you do not hear please let me know.    Check back in 2 weeks

## 2022-08-01 ENCOUNTER — Telehealth: Payer: Self-pay | Admitting: Family Medicine

## 2022-08-01 MED ORDER — LORAZEPAM 0.5 MG PO TABS: ORAL_TABLET | ORAL | 0 refills | Status: DC

## 2022-08-01 NOTE — Telephone Encounter (Signed)
Received notification from MRI scheduling that patient requests antianxiety medication for claustrophobia with MRI.  Prescribed Ativan.

## 2022-08-02 ENCOUNTER — Encounter: Payer: Self-pay | Admitting: Physical Therapy

## 2022-08-03 ENCOUNTER — Ambulatory Visit (INDEPENDENT_AMBULATORY_CARE_PROVIDER_SITE_OTHER): Payer: Medicaid Other

## 2022-08-03 DIAGNOSIS — M542 Cervicalgia: Secondary | ICD-10-CM | POA: Diagnosis not present

## 2022-08-05 NOTE — Progress Notes (Signed)
MRI cervical spine shows bulging disc that caused a little bit of spinal cord stenosis.  It is hard to tell how long this bulging disc has been there but if the spinal cord was already a little bit pinched and then you had a trauma to your neck that could explain some of the temporary weakness that you experience.  You do not need surgery right now.  The symptoms from your neck will very likely get better on treatment.  We will talk about this in full detail when you come back on August 23.

## 2022-08-07 ENCOUNTER — Ambulatory Visit: Payer: Medicaid Other

## 2022-08-07 DIAGNOSIS — R42 Dizziness and giddiness: Secondary | ICD-10-CM

## 2022-08-07 NOTE — Progress Notes (Signed)
OUTPATIENT PHYSICAL THERAPY VESTIBULAR EVALUATION ARRIVED - NO CHARGE     Patient Name: [name] MRN: [id number] DOB:Dec 03, 1981, 41 y.o., female Today's Date: 08/07/2022  PCP: No PCP on File REFERRING PROVIDER: Gregor Hams, MD   PT End of Session - 08/07/22 1026     Visit Number 1    Number of Visits 1    Date for PT Re-Evaluation 08/08/22    Authorization Type [employer] Medicaid (VL: 27)    PT Start Time 1026   Pt arriving late   PT Stop Time 1050    PT Time Calculation (min) 24 min    Activity Tolerance Patient limited by pain;Other (comment)   Nausea/Vomitting   Behavior During Therapy WFL for tasks assessed/performed             Past Medical History:  Diagnosis Date   Anxiety    Arthritis    Asthma    meds as needed   Bronchitis    Chronic back pain    Ectopic pregnancy    requiring surgery   Elevated liver enzymes    GERD (gastroesophageal reflux disease)    Gonorrhea    Hypertension    Irritable bowel    Migraine headache    Peripheral vascular disease ([place])    Pneumonia    2017   Preterm labor    Seizures (HCC)    fever induced during childhood   Past Surgical History:  Procedure Laterality Date   ABDOMINAL HYSTERECTOMY     DILITATION & CURRETTAGE/HYSTROSCOPY WITH NOVASURE ABLATION N/A 02/20/2016   Procedure: HYSTEROSCOPY WITH NOVASURE ABLATION;  Surgeon: [name], MD;  Location: [facility];  Service: Gynecology;  Laterality: N/A;   LAPAROSCOPIC VAGINAL HYSTERECTOMY WITH SALPINGECTOMY Left 11/18/2017   Procedure: LAPAROSCOPIC ASSISTED VAGINAL HYSTERECTOMY WITH SALPINGECTOMY;  Surgeon: Emily Filbert, MD;  Location: [facility];  Service: Gynecology;  Laterality: Left;   LAPAROSCOPY  05/30/2012   Procedure: LAPAROSCOPY OPERATIVE;  Surgeon: Osborne Oman, MD;  Location: [facility];  Service: Gynecology;  Laterality: N/A;  Operative laparoscopy right salpingectomy and removal of ectopic pregnancy   LAPAROSCOPY FOR ECTOPIC PREGNANCY      SALPINGECTOMY     TUBAL LIGATION  2005   WISDOM TOOTH EXTRACTION     Patient Active Problem List   Diagnosis Date Noted   Syncope and collapse 03/27/2022   Alteration consciousness 11/23/2021   Chronic migraine w/o aura w/o status migrainosus, not intractable 11/23/2021   Vitamin D deficiency 09/07/2019   Lower abdominal pain 01/22/2019   Post-operative state 11/18/2017   Fibroid uterus 11/02/2015   Abnormal uterine bleeding (AUB) 11/02/2015   Asthma exacerbation 03/23/2015   Tobacco abuse 03/23/2015   History of migraine 03/23/2015   Acute asthma exacerbation    Cough    Acute bronchitis    Asthma 03/22/2015   Migraine headache 03/07/2014    ONSET DATE: 07/19/22 (Date of Injury)  REFERRING DIAG: F07.81 (ICD-10-CM) - Post concussion syndrome  THERAPY DIAG:  Dizziness and giddiness  Rationale for Evaluation and Treatment Rehabilitation  SUBJECTIVE:   SUBJECTIVE STATEMENT: Patient presented to ED on 7/32 after sustaining a 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 Brought into ED via EMS on 07/26/22 for complaints of headache, dizziness and "feeling paralyzed". Has been following up with Dr. Georgina Snell. Patient reports she is very dizzy. Has severe headaches that wakes her up from sleep and feels like her head is going to explode. Reports medicaid denied her  medication for headaches. Also wakes up and she can feels paralyzed, reports she feels it from the shoulders down. Has tingling/numbness and cold sensation. Reports bulging disc in the cervical spine. Reports that she has had difficulty breathing noticeable when she is sleeping, "reports it shakes the bed". Walked in with a RW today. Reports has not taken meclizine. Patient reports she is taken a large dose of tylenol.   Pt accompanied by: daughter  PERTINENT HISTORY: Anxiety, Arthritis, Bronchitis, Asthma, Chronic Back Pain, GERD, HTN, Miraines, PVD, Seizures  PAIN:  Are you having pain? Yes: NPRS  scale: 10/10 Pain location: Frontal Head/Spine Pain description: Headache; Sharp  PRECAUTIONS: Fall  WEIGHT BEARING RESTRICTIONS No  FALLS: Has patient fallen in last 6 months? Yes. 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 2. Additional minimal noncompressive disc bulging at C4-5 and C6-7 without stenosis.  CT Cervical/Head: 1. No acute intracranial abnormality. 2. No acute cervical spine fracture or subluxation  COGNITION: Overall cognitive status: No family/caregiver present to determine baseline cognitive functioning   SENSATION: Reports increased numbness/tingling in BLE/BUE  POSTURE: rounded shoulders and forward head   Cervical ROM: To Be Assessed at Future Visit   TRANSFERS: Assistive device utilized: Environmental consultant - 2 wheeled  Sit to stand: SBA Stand to sit: SBA  GAIT: Gait pattern: step through pattern Distance walked: clinic distance Assistive device utilized: Environmental consultant - 2 wheeled Level of assistance: SBA Comments: reports she started using walker more recently; veering into wall on L when exiting evaluation  FUNCTIONAL TESTs:  Unable to test  PATIENT SURVEYS:  Staff Did Not Capture   VESTIBULAR ASSESSMENT    SYMPTOM BEHAVIOR:   Subjective history: Dizziness since the concussion, notices it with quick head/body movements. Reports it is also worse with the eyes closed.    Non-Vestibular symptoms: neck pain, headaches, and nausea/vomiting; light sensitivity; blurred vision; tinnitus    Type of dizziness: Imbalance (Disequilibrium), Spinning/Vertigo, Unsteady with  head/body turns, and Lightheadedness/Faint   Frequency: daily    Duration: constant dizziness   Aggravating factors: Spontaneous, Induced by position change: lying supine, supine to sit, and sit to stand, Induced by motion: looking up at the ceiling, turning body quickly, turning head quickly, and activity in general, and Moving eyes   Relieving factors: no known relieving factors   Progression of symptoms: worse   OCULOMOTOR EXAM:   Ocular Alignment: normal   Ocular ROM: No Limitations   Spontaneous Nystagmus: absent   Gaze-Induced Nystagmus: absent   Smooth Pursuits: intact; reports burning sensation in eyes and brain, severe dizziness. Had to close eyes and lean back against the wall. Reports nausea   Saccades:  patient unable to tolerate    VESTIBULAR - OCULAR REFLEX:    Unable to tolerate any additional testing due to significant symptoms, pain and nausea     PATIENT EDUCATION: Education details: Plan to follow up with MD; Will not be able to tolerate therapy at this time. Person educated: Patient Education method: Explanation Education comprehension: verbalized understanding  HOME EXERCISE PROGRAM:  GOALS: Goals reviewed with patient? Yes  SHORT TERM GOALS and LONG TERM GOALS:  To be Set upon return  to PT session  ASSESSMENT:  CLINICAL IMPRESSION: Patient is a 41 y.o. female referred to Neuro OPPT services for Post Concussion Syndrome. Patient's PMH significant for the following: Anxiety, Arthritis, Bronchitis, Asthma, Chronic Back Pain, GERD, HTN, Miraines, PVD, Seizures. Upon evaluation, patient presents with the following impairments: abnormal gait, impaired sensation, increased motion sensitivity, decreased activity tolerance, dizziness, significant pain. Patient unable to tolerate any vestibular testing due to severe symptoms and nausea with smooth pursuits. Due to VL and unable to tolerate testing and therefore treatment, PT educating will need to get pain under  control prior to return to PT services.    OBJECTIVE IMPAIRMENTS Abnormal gait, decreased activity tolerance, decreased balance, decreased coordination, decreased knowledge of use of DME, difficulty walking, decreased ROM, dizziness, impaired sensation, postural dysfunction, and pain.   ACTIVITY LIMITATIONS lifting, bending, sitting, standing, bed mobility, hygiene/grooming, and locomotion level  PARTICIPATION LIMITATIONS: community activity and occupation  PERSONAL FACTORS Behavior pattern, Time since onset of injury/illness/exacerbation, and 3+ comorbidities: Anxiety, Arthritis, Bronchitis, Asthma, Chronic Back Pain, GERD, HTN, Miraines, PVD, Seizures  are also affecting patient's functional outcome.   REHAB POTENTIAL: Poor lack of ability to participate in  [place]: Unstable/unpredictable  EVALUATION COMPLEXITY: High   PLAN:  PLANNED INTERVENTIONS: Therapeutic exercises, Therapeutic activity, Neuromuscular re-education, Balance training, Gait training, Patient/Family education, Self Care, Joint mobilization, Joint manipulation, Stair training, Vestibular training, Canalith repositioning, Orthotic/Fit training, Prosthetic training, DME instructions, Aquatic Therapy, Cryotherapy, Moist heat, Manual therapy, and Re-evaluation  PLAN FOR NEXT SESSION: Need to be reassessed upon return if can tolerate PT   Kirtland Bouchard, PT, DPT 08/07/2022, 12:11 PM

## 2022-08-14 ENCOUNTER — Ambulatory Visit: Payer: Medicaid Other | Admitting: Family Medicine

## 2022-08-15 ENCOUNTER — Other Ambulatory Visit: Payer: Self-pay | Admitting: Family Medicine

## 2022-08-15 ENCOUNTER — Ambulatory Visit (INDEPENDENT_AMBULATORY_CARE_PROVIDER_SITE_OTHER): Payer: Medicaid Other | Admitting: Family Medicine

## 2022-08-15 VITALS — BP 140/88 | HR 58 | Ht 64.5 in | Wt 202.0 lb

## 2022-08-15 DIAGNOSIS — R42 Dizziness and giddiness: Secondary | ICD-10-CM

## 2022-08-15 DIAGNOSIS — F0781 Postconcussional syndrome: Secondary | ICD-10-CM

## 2022-08-15 DIAGNOSIS — G43719 Chronic migraine without aura, intractable, without status migrainosus: Secondary | ICD-10-CM | POA: Diagnosis not present

## 2022-08-15 MED ORDER — AJOVY 225 MG/1.5ML ~~LOC~~ SOAJ
225.0000 mg | SUBCUTANEOUS | 12 refills | Status: DC
Start: 2022-08-15 — End: 2022-08-15

## 2022-08-15 NOTE — Patient Instructions (Signed)
Thank you for coming in today.   We will try to get Ajovy approved for headache prevention.   Continue vestinular PT.   Recheck in 1 month.
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
## 2022-08-15 NOTE — Telephone Encounter (Signed)
Looks like we need to do a PA for Ajovy. See Neuro notes. She has tried amitriptyline/nortriptyline, propranolol and Topamax and has daily migraine headaches. This looks like the preferred medicine for her insurance.

## 2022-08-16 NOTE — Telephone Encounter (Signed)
This has been sent to insurance awaiting their response on coverage

## 2022-08-21 ENCOUNTER — Telehealth: Payer: Medicaid Other | Admitting: Family Medicine

## 2022-08-27 ENCOUNTER — Inpatient Hospital Stay: Payer: Medicaid Other | Admitting: Neurology

## 2022-08-28 ENCOUNTER — Other Ambulatory Visit: Payer: Self-pay | Admitting: Family Medicine

## 2022-08-28 ENCOUNTER — Telehealth: Payer: Self-pay | Admitting: Family Medicine

## 2022-08-28 NOTE — Telephone Encounter (Signed)
Pharmacy stating PA needed for Ajovy, pt calling for status of this.

## 2022-08-28 NOTE — Telephone Encounter (Signed)
I already tried doing an authorization on this drug and it was denied by her insurance. I do not know what the next step is after that

## 2022-08-28 NOTE — Telephone Encounter (Signed)
Looks like we need to auth this drug. It should be on her plan.  Please help me here.  [name]

## 2022-08-29 ENCOUNTER — Ambulatory Visit (INDEPENDENT_AMBULATORY_CARE_PROVIDER_SITE_OTHER): Payer: Medicaid Other | Admitting: Neurology

## 2022-08-29 ENCOUNTER — Encounter: Payer: Self-pay | Admitting: Neurology

## 2022-08-29 ENCOUNTER — Encounter: Payer: Self-pay | Admitting: Family Medicine

## 2022-08-29 VITALS — BP 120/80 | HR 62 | Ht 65.0 in | Wt 196.0 lb

## 2022-08-29 DIAGNOSIS — R159 Full incontinence of feces: Secondary | ICD-10-CM

## 2022-08-29 DIAGNOSIS — R269 Unspecified abnormalities of gait and mobility: Secondary | ICD-10-CM

## 2022-08-29 DIAGNOSIS — G43709 Chronic migraine without aura, not intractable, without status migrainosus: Secondary | ICD-10-CM

## 2022-08-29 MED ORDER — EMGALITY 120 MG/ML ~~LOC~~ SOAJ: SUBCUTANEOUS | 11 refills | Status: AC

## 2022-08-29 NOTE — Progress Notes (Signed)
I am having difficultly getting CGRP authorized. She has tried several conservative management options. Hopefully neurology may be able to help.
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
## 2022-08-30 ENCOUNTER — Telehealth: Payer: Self-pay | Admitting: Neurology

## 2022-08-30 LAB — THYROID PANEL WITH TSH
Free Thyroxine Index: 1.8 (ref 1.2–4.9)
T3 Uptake Ratio: 24 % (ref 24–39)
T4, Total: 7.4 ug/dL (ref 4.5–12.0)
TSH: 0.374 u[IU]/mL — ABNORMAL LOW (ref 0.450–4.500)

## 2022-08-30 LAB — CK: Total CK: 177 U/L (ref 32–182)

## 2022-08-30 NOTE — Telephone Encounter (Signed)
MRI thoracic [employer] medicaid Josem Kaufmann: [id number] exp. 08/30/22-10/14/22 sent to GI MRI lumbar: The medical record for this patient is required to complete medical necessity review. This request will be pended until relevant medical records are uploaded at [email or web address] or sent via fax to [phone]. Case Number: [id number]

## 2022-09-04 NOTE — Telephone Encounter (Signed)
Office notes faxed to [facility] 09/03/22

## 2022-09-09 NOTE — Telephone Encounter (Signed)
MRI lumbar spine [employer] medicaid auth: [id number] exp. 09/06/22-10/21/22  Both sent to sent to GI

## 2022-09-11 ENCOUNTER — Encounter: Payer: Self-pay | Admitting: Physical Therapy

## 2022-09-11 ENCOUNTER — Ambulatory Visit: Payer: Medicaid Other | Attending: Neurology | Admitting: Physical Therapy

## 2022-09-11 DIAGNOSIS — R159 Full incontinence of feces: Secondary | ICD-10-CM | POA: Diagnosis not present

## 2022-09-11 DIAGNOSIS — G43709 Chronic migraine without aura, not intractable, without status migrainosus: Secondary | ICD-10-CM | POA: Insufficient documentation

## 2022-09-11 DIAGNOSIS — R2681 Unsteadiness on feet: Secondary | ICD-10-CM | POA: Diagnosis present

## 2022-09-11 DIAGNOSIS — R269 Unspecified abnormalities of gait and mobility: Secondary | ICD-10-CM | POA: Insufficient documentation

## 2022-09-11 DIAGNOSIS — R42 Dizziness and giddiness: Secondary | ICD-10-CM | POA: Insufficient documentation
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
## 2022-09-17 ENCOUNTER — Ambulatory Visit: Payer: Medicaid Other

## 2022-09-19 ENCOUNTER — Encounter: Payer: Self-pay | Admitting: Neurology

## 2022-09-19 ENCOUNTER — Encounter: Payer: Self-pay | Admitting: Physical Therapy

## 2022-09-19 ENCOUNTER — Ambulatory Visit: Payer: Medicaid Other | Admitting: Physical Therapy

## 2022-09-19 ENCOUNTER — Telehealth: Payer: Self-pay

## 2022-09-19 DIAGNOSIS — R2681 Unsteadiness on feet: Secondary | ICD-10-CM

## 2022-09-19 DIAGNOSIS — R42 Dizziness and giddiness: Secondary | ICD-10-CM

## 2022-09-19 NOTE — Telephone Encounter (Signed)
PA for [facility] INJ '120MG'$ /ML was denied by insurance. (Key: [id number])  Per your health plan's criteria, this drug is covered if you meet the following: (1) You have tried one of the following drugs for at least one month: (A) Antidepressants (for example, amitriptyline, venlafaxine). (B) Beta blockers (for example, propranolol, metoprolol, timolol, atenolol). (C) Angiotensin converting enzyme inhibitors/angiotensin II receptor blockers (for example, lisinopril, candesartan). (D) Calcium channel blockers (for example, verapamil, nimodipine). The information provided does not show that you meet the criteria listed above. Please speak with your doctor about your choices. This decision was made per the [facility] Guideline
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
## 2022-09-23 ENCOUNTER — Ambulatory Visit: Payer: Medicaid Other | Attending: Neurology

## 2022-09-23 DIAGNOSIS — R42 Dizziness and giddiness: Secondary | ICD-10-CM | POA: Insufficient documentation

## 2022-09-23 DIAGNOSIS — R2681 Unsteadiness on feet: Secondary | ICD-10-CM | POA: Insufficient documentation

## 2022-09-23 NOTE — Therapy (Signed)
OUTPATIENT PHYSICAL THERAPY VESTIBULAR TREATMENT NOTE     Patient Name: [name] MRN: [id number] DOB:04/23/1981, 41 y.o., female Today's Date: 09/23/2022  PCP: No PCP on File REFERRING PROVIDER: Marcial Pacas, MD    PT End of Session - 09/23/22 1449     Visit Number 3    Number of Visits 13    Date for PT Re-Evaluation 11/10/22    Authorization Type UHC Medicaid (VL: 27)    PT Start Time 0109    PT Stop Time 1530    PT Time Calculation (min) 45 min    Equipment Utilized During Treatment Gait belt    Activity Tolerance Patient tolerated treatment well   increased dizziness at times   Behavior During Therapy WFL for tasks assessed/performed              Past Medical History:  Diagnosis Date   Anxiety    Arthritis    Asthma    meds as needed   Bronchitis    Chronic back pain    Ectopic pregnancy    requiring surgery   Elevated liver enzymes    GERD (gastroesophageal reflux disease)    Gonorrhea    Hypertension    Irritable bowel    Migraine headache    Peripheral vascular disease ([place])    Pneumonia    2017   Preterm labor    Seizures ([place])    fever induced during childhood   Past Surgical History:  Procedure Laterality Date   ABDOMINAL HYSTERECTOMY     DILITATION & CURRETTAGE/HYSTROSCOPY WITH NOVASURE ABLATION N/A 02/20/2016   Procedure: HYSTEROSCOPY WITH NOVASURE ABLATION;  Surgeon: [name], MD;  Location: [facility];  Service: Gynecology;  Laterality: N/A;   LAPAROSCOPIC VAGINAL HYSTERECTOMY WITH SALPINGECTOMY Left 11/18/2017   Procedure: LAPAROSCOPIC ASSISTED VAGINAL HYSTERECTOMY WITH SALPINGECTOMY;  Surgeon: Emily Filbert, MD;  Location: [facility];  Service: Gynecology;  Laterality: Left;   LAPAROSCOPY  05/30/2012   Procedure: LAPAROSCOPY OPERATIVE;  Surgeon: Osborne Oman, MD;  Location: [facility];  Service: Gynecology;  Laterality: N/A;  Operative laparoscopy right salpingectomy and removal of ectopic pregnancy   LAPAROSCOPY FOR  ECTOPIC PREGNANCY     SALPINGECTOMY     TUBAL LIGATION  2005   WISDOM TOOTH EXTRACTION     Patient Active Problem List   Diagnosis Date Noted   Incontinence of feces 08/29/2022   Gait abnormality 08/29/2022   Syncope and collapse 03/27/2022   Alteration consciousness 11/23/2021   Chronic migraine w/o aura w/o status migrainosus, not intractable 11/23/2021   Vitamin D deficiency 09/07/2019   Lower abdominal pain 01/22/2019   Post-operative state 11/18/2017   Fibroid uterus 11/02/2015   Abnormal uterine bleeding (AUB) 11/02/2015   Asthma exacerbation 03/23/2015   Tobacco abuse 03/23/2015   History of migraine 03/23/2015   Acute asthma exacerbation    Cough    Acute bronchitis    Asthma 03/22/2015   Migraine headache 03/07/2014    ONSET DATE: 08/29/2022 (date of referral)  REFERRING DIAG: G43.709 (ICD-10-CM) - Chronic migraine w/o aura w/o status migrainosus, not intractable R26.9 (ICD-10-CM) - Gait abnormality   THERAPY DIAG:  Dizziness and giddiness  Unsteadiness on feet  Rationale for Evaluation and Treatment Rehabilitation  SUBJECTIVE:   SUBJECTIVE STATEMENT: Patient reports doing fair. States that HEP is difficult because it makes her dizzy. States she is able to mow her lawn with mild dizziness. She did take Meclazine last night and states she typically does take it everyday.  Pt accompanied by: self  PERTINENT HISTORY: Anxiety, Arthritis, Bronchitis, Asthma, Chronic Back Pain, GERD, HTN, Migraines, PVD, Seizures   PAIN:    Are you having pain? Yes: NPRS scale: 7/10 Pain location: Back & Neck; primarily L side Pain description: Throbbing & sharp  Aggravating factors: Movement Relieving factors: Some exercises that she has previously learned from the chiropractor; heat/ice; back brace    PRECAUTIONS: Fall, No driving.   WEIGHT BEARING RESTRICTIONS No  FALLS: Has patient fallen in last 6 months? Yes. Number of falls ~10  LIVING ENVIRONMENT: Lives  with: lives with their family Lives in: House/apartment Has following equipment at home: Gilford Rile - 2 wheeled  PLOF: Independent Hx of being a truck driver, pt currently out of work.   PATIENT GOALS Wants to stop being dizzy. Wants to be be able to bend down and pick something up without being dizzy.      VESTIBULAR TREATMENT:  [facility] PT Assessment - 09/23/22 0001       Functional Gait  Assessment   Gait assessed  Yes    Gait Level Surface Walks 20 ft in less than 7 sec but greater than 5.5 sec, uses assistive device, slower speed, mild gait deviations, or deviates 6-10 in outside of the 12 in walkway width.    Change in Gait Speed Able to smoothly change walking speed without loss of balance or gait deviation. Deviate no more than 6 in outside of the 12 in walkway width.    Gait with Horizontal Head Turns Performs head turns smoothly with slight change in gait velocity (eg, minor disruption to smooth gait path), deviates 6-10 in outside 12 in walkway width, or uses an assistive device.    Gait with Vertical Head Turns Performs task with slight change in gait velocity (eg, minor disruption to smooth gait path), deviates 6 - 10 in outside 12 in walkway width or uses assistive device    Gait and Pivot Turn Pivot turns safely within 3 sec and stops quickly with no loss of balance.    Step Over Obstacle Is able to step over one shoe box (4.5 in total height) without changing gait speed. No evidence of imbalance.    Gait with Narrow Base of Support Ambulates 7-9 steps.    Gait with Eyes Closed Walks 20 ft, uses assistive device, slower speed, mild gait deviations, deviates 6-10 in outside 12 in walkway width. Ambulates 20 ft in less than 9 sec but greater than 7 sec.    Ambulating Backwards Walks 20 ft, no assistive devices, good speed, no evidence for imbalance, normal gait    Steps Alternating feet, must use rail.    Total Score 23           *reports increased dizziness with going down  stairs due to need to look down- states there may be an anxiety component  -habituation: bending down picking up objects from the floor 3x5  -reports increased frontal/sinus pressure with head in dependent position, but no increase in dizziness noted  -BP afterward: 152/88 -seated VOR x1 horizontal (4/5 dizziness taking ~45s to subside)  -seated VOR x1 vertical (3/5 dizziness taking ~1 min to subside and sharp shooting pain around hairline (initial injury site))   PATIENT EDUCATION: Education details:  continue HEP Person educated: Patient Education method: Explanation, Demonstration and Handout Education comprehension: verbalized understanding, returned demonstration  HOME EXERCISE PROGRAM:  Medbridge HEP: Access Code: [email or web address] URL: [email or web address] Date: 09/19/2022 Prepared by: Ethelene Browns  Exercises - Standing Gaze  Stabilization with Head Rotation  - 3-5 x daily - 7 x weekly - 1 sets - 1 reps - 30 secs hold - Romberg Stance with Eyes Closed  - 1 x daily - 7 x weekly - 3 sets - 10 reps  GOALS: Goals reviewed with patient? Yes  SHORT TERM GOALS: Target date: 10/02/2022   Pt will be independent with initial HEP in order to build upon functional gains made in therapy. Baseline: dependent with HEP.  Goal status: INITIAL  2.  Pt will rate items on MSQ as a 3/5 or less in order to demo improved motion sensitivity.  Baseline: did not get to finish assessing at eval, starting off at 4/5.  Goal status: INITIAL  3.  mCTSIB to be assessed with LTG written.  Baseline: assessed; goal not needed Goal status: MET  4.  FGA to be assessed with LTG writen. Baseline: 23/30 Goal status: MET   LONG TERM GOALS: Target date: 10/23/2022    Pt will be independent with final HEP in order to build upon functional gains made in therapy. Baseline: dependent Goal status: INITIAL  2.  Pt will rate items on MSQ as a 2/5 or less in order to demo improved motion sensitivity.   Baseline: did not get to finish assessing at eval, starting off at 4/5.  Goal status: INITIAL  3.  mCTSIB goal to be written.  Baseline: discontinued; goal not needed Goal status: MET  4.  Pt will improve FGA to >/= 28/30 to demonstrate improved balance and reduced fall risk Baseline: 23/30 Goal status: INITIAL   ASSESSMENT:  CLINICAL IMPRESSION: Patient seen for skilled PT session with emphasis on vestibular retraining. Patient demonstrates increased fall risk as noted by score of 23/30 on  Functional Gait Assessment.   <22/30 = predictive of falls, <20/30 = fall in 6 months, <18/30 = predictive of falls in PD MCID: 5 points stroke population, 4 points geriatric population (ANPTA Core Set of Outcome Measures for Adults with Neurologic Conditions, 2018) with noted increase in dizziness with walking down stairs due to need to look down. Patient reports that there may be an anxiety component with this. Tolerating VOR exercises well, but reports up to 4/5 dizziness with increased blurred vision afterward. Vertical head nods exacerbating neck pain. Discussed with patient that she's reporting sinus- pressure- like pain with head in dependent position and she reports previous sinus problems that may be contributing to her experience of dizziness and pain. Recommended that patient talk to MD or pharmacist regarding rx to help minimize sinus pressure. She verbalized understanding. Continue POC.     OBJECTIVE IMPAIRMENTS decreased activity tolerance, decreased balance, decreased mobility, difficulty walking, decreased ROM, dizziness, impaired sensation, postural dysfunction, and pain.   ACTIVITY LIMITATIONS bending, squatting, stairs, transfers, and locomotion level  PARTICIPATION LIMITATIONS: driving, community activity, and occupation  PERSONAL FACTORS Age, Behavior pattern, Past/current experiences, Time since onset of injury/illness/exacerbation, and 3+ comorbidities: Anxiety, Arthritis,  Bronchitis, Asthma, Chronic Back Pain, GERD, HTN, Migraines, PVD, Seizures  are also affecting patient's functional outcome.   REHAB POTENTIAL: Good  CLINICAL DECISION MAKING: Evolving/moderate complexity  EVALUATION COMPLEXITY: Moderate   PLAN: PT FREQUENCY: 2x/week  PT DURATION: 6 weeks  PLANNED INTERVENTIONS: Therapeutic exercises, Therapeutic activity, Neuromuscular re-education, Balance training, Gait training, Patient/Family education, Self Care, Joint mobilization, Stair training, Vestibular training, Canalith repositioning, Manual therapy, and Re-evaluation  PLAN FOR NEXT SESSION: Check HEP issued on 09-19-22 for any questions/problems:  progress standing with EC on floor to pillows   for HEP if pt able to tolerate: dynamic balance with vestibular input incorporated - habituation - bending down to floor, etc.    Debbora Dus, PT Debbora Dus, PT, DPT, CBIS  09/23/2022, 3:32 PM   Managed medicaid CPT codes: [phone] - PT Re-evaluation, [phone]- Therapeutic Exercise, [phone]- Neuro Re-education, [phone] - Gait Training, [place], [address] - Therapeutic Activities, [address] - Self Care, and [phone] - Canalith Repositioning

## 2022-09-24 ENCOUNTER — Ambulatory Visit
Admission: RE | Admit: 2022-09-24 | Discharge: 2022-09-24 | Disposition: A | Discharge status: Home / Self Care | Payer: Medicaid Other | Source: Ambulatory Visit | Attending: Neurology | Admitting: Neurology

## 2022-09-24 DIAGNOSIS — R159 Full incontinence of feces: Secondary | ICD-10-CM

## 2022-09-24 DIAGNOSIS — G43709 Chronic migraine without aura, not intractable, without status migrainosus: Secondary | ICD-10-CM | POA: Diagnosis not present

## 2022-09-25 ENCOUNTER — Ambulatory Visit: Payer: Medicaid Other

## 2022-09-27 ENCOUNTER — Ambulatory Visit (INDEPENDENT_AMBULATORY_CARE_PROVIDER_SITE_OTHER): Payer: Medicaid Other | Admitting: Internal Medicine

## 2022-09-27 ENCOUNTER — Ambulatory Visit: Payer: Medicaid Other

## 2022-09-27 ENCOUNTER — Encounter: Payer: Self-pay | Admitting: Internal Medicine

## 2022-09-27 VITALS — BP 134/86 | HR 98 | Ht 65.0 in | Wt 200.6 lb

## 2022-09-27 DIAGNOSIS — E559 Vitamin D deficiency, unspecified: Secondary | ICD-10-CM | POA: Diagnosis not present

## 2022-09-27 DIAGNOSIS — R748 Abnormal levels of other serum enzymes: Secondary | ICD-10-CM | POA: Diagnosis not present

## 2022-09-27 DIAGNOSIS — E0590 Thyrotoxicosis, unspecified without thyrotoxic crisis or storm: Secondary | ICD-10-CM

## 2022-09-27 DIAGNOSIS — R42 Dizziness and giddiness: Secondary | ICD-10-CM | POA: Diagnosis not present

## 2022-09-27 DIAGNOSIS — Z23 Encounter for immunization: Secondary | ICD-10-CM

## 2022-09-27 DIAGNOSIS — R2681 Unsteadiness on feet: Secondary | ICD-10-CM

## 2022-09-27 LAB — T4, FREE: Free T4: 0.88 ng/dL (ref 0.60–1.60)

## 2022-09-27 LAB — VITAMIN D 25 HYDROXY (VIT D DEFICIENCY, FRACTURES): VITD: 16.03 ng/mL — ABNORMAL LOW (ref 30.00–100.00)

## 2022-09-27 LAB — TSH: TSH: 0.49 u[IU]/mL (ref 0.35–5.50)

## 2022-09-27 NOTE — Therapy (Signed)
OUTPATIENT PHYSICAL THERAPY VESTIBULAR TREATMENT NOTE     Patient Name: [name] MRN: [id number] DOB:Sep 08, 1981, 41 y.o., female Today's Date: 09/27/2022  PCP: No PCP on File REFERRING PROVIDER: Marcial Pacas, MD    PT End of Session - 09/27/22 1107     Visit Number 4    Number of Visits 13    Date for PT Re-Evaluation 11/10/22    Authorization Type UHC Medicaid (VL: 27)    PT Start Time 1105    PT Stop Time 1145    PT Time Calculation (min) 40 min    Equipment Utilized During Treatment Gait belt    Activity Tolerance Patient tolerated treatment well    Behavior During Therapy WFL for tasks assessed/performed              Past Medical History:  Diagnosis Date   Anxiety    Arthritis    Asthma    meds as needed   Bronchitis    Chronic back pain    Ectopic pregnancy    requiring surgery   Elevated liver enzymes    GERD (gastroesophageal reflux disease)    Gonorrhea    Hypertension    Irritable bowel    Migraine headache    Peripheral vascular disease ([place])    Pneumonia    2017   Preterm labor    Seizures ([place])    fever induced during childhood   Past Surgical History:  Procedure Laterality Date   ABDOMINAL HYSTERECTOMY     DILITATION & CURRETTAGE/HYSTROSCOPY WITH NOVASURE ABLATION N/A 02/20/2016   Procedure: HYSTEROSCOPY WITH NOVASURE ABLATION;  Surgeon: [name], MD;  Location: [facility];  Service: Gynecology;  Laterality: N/A;   LAPAROSCOPIC VAGINAL HYSTERECTOMY WITH SALPINGECTOMY Left 11/18/2017   Procedure: LAPAROSCOPIC ASSISTED VAGINAL HYSTERECTOMY WITH SALPINGECTOMY;  Surgeon: Emily Filbert, MD;  Location: [facility];  Service: Gynecology;  Laterality: Left;   LAPAROSCOPY  05/30/2012   Procedure: LAPAROSCOPY OPERATIVE;  Surgeon: Osborne Oman, MD;  Location: [facility];  Service: Gynecology;  Laterality: N/A;  Operative laparoscopy right salpingectomy and removal of ectopic pregnancy   LAPAROSCOPY FOR ECTOPIC PREGNANCY     SALPINGECTOMY      TUBAL LIGATION  2005   WISDOM TOOTH EXTRACTION     Patient Active Problem List   Diagnosis Date Noted   Incontinence of feces 08/29/2022   Gait abnormality 08/29/2022   Syncope and collapse 03/27/2022   Alteration consciousness 11/23/2021   Chronic migraine w/o aura w/o status migrainosus, not intractable 11/23/2021   Vitamin D deficiency 09/07/2019   Lower abdominal pain 01/22/2019   Post-operative state 11/18/2017   Fibroid uterus 11/02/2015   Abnormal uterine bleeding (AUB) 11/02/2015   Asthma exacerbation 03/23/2015   Tobacco abuse 03/23/2015   History of migraine 03/23/2015   Acute asthma exacerbation    Cough    Acute bronchitis    Asthma 03/22/2015   Migraine headache 03/07/2014    ONSET DATE: 08/29/2022 (date of referral)  REFERRING DIAG: G43.709 (ICD-10-CM) - Chronic migraine w/o aura w/o status migrainosus, not intractable R26.9 (ICD-10-CM) - Gait abnormality   THERAPY DIAG:  Dizziness and giddiness  Unsteadiness on feet  Rationale for Evaluation and Treatment Rehabilitation  SUBJECTIVE:   SUBJECTIVE STATEMENT: Patient reports doing fair. HEP going well. Does have significant back pain today, which is not unusual for her. Did have an ECHO early this AM and so has not been able to take any medications. Did not take meclazine last night.  Pt accompanied by: self  PERTINENT HISTORY: Anxiety, Arthritis, Bronchitis, Asthma, Chronic Back Pain, GERD, HTN, Migraines, PVD, Seizures   PAIN:    Are you having pain? Yes: NPRS scale: 10/10 Pain location: Back & Neck; primarily L side Pain description: Throbbing & sharp  Aggravating factors: Movement Relieving factors: Some exercises that she has previously learned from the chiropractor; heat/ice; back brace    PRECAUTIONS: Fall, No driving.   WEIGHT BEARING RESTRICTIONS No  FALLS: Has patient fallen in last 6 months? Yes. Number of falls ~10   PATIENT GOALS Wants to stop being dizzy. Wants to be be  able to bend down and pick something up without being dizzy.    VESTIBULAR TREATMENT: -habituation bending down to floor x5 reps B  -walking forward/backward VOR x1 x56f (horizontal/vertical)  -seated busy background Hart Chart on the ground  -fwd/backward walk over busy background rug 2x10 ft    PATIENT EDUCATION: Education details:  continue HEP, scheduling mental breaks when doing school work  Person educated: Patient Education method: EConsulting civil engineer DFacilities managerEducation comprehension: verbalized understanding, returned demonstration  HOME EXERCISE PROGRAM:  [employer] HEP: Access Code: [email or web address]: [email or web address] Date: 09/19/2022 Prepared by: LEthelene Browns Exercises - Standing Gaze Stabilization with Head Rotation  - 3-5 x daily - 7 x weekly - 1 sets - 1 reps - 30 secs hold - Romberg Stance with Eyes Closed  - 1 x daily - 7 x weekly - 3 sets - 10 reps  GOALS: Goals reviewed with patient? Yes  SHORT TERM GOALS: Target date: 10/02/2022   Pt will be independent with initial HEP in order to build upon functional gains made in therapy. Baseline: dependent with HEP.  Goal status: INITIAL  2.  Pt will rate items on MSQ as a 3/5 or less in order to demo improved motion sensitivity.  Baseline: did not get to finish assessing at eval, starting off at 4/5.  Goal status: INITIAL  3.  mCTSIB to be assessed with LTG written.  Baseline: assessed; goal not needed Goal status: MET  4.  FGA to be assessed with LTG writen. Baseline: 23/30 Goal status: MET   LONG TERM GOALS: Target date: 10/23/2022    Pt will be independent with final HEP in order to build upon functional gains made in therapy. Baseline: dependent Goal status: INITIAL  2.  Pt will rate items on MSQ as a 2/5 or less in order to demo improved motion sensitivity.  Baseline: did not get to finish assessing at eval, starting off at 4/5.  Goal status: INITIAL  3.  mCTSIB goal to  be written.  Baseline: discontinued; goal not needed Goal status: MET  4.  Pt will improve FGA to >/= 28/30 to demonstrate improved balance and reduced fall risk Baseline: 23/30 Goal status: INITIAL   ASSESSMENT:  CLINICAL IMPRESSION: Patient seen for skilled PT session with emphasis on vestibular retraining. She tolerated progressions to VOR well with only mild increase in dizziness. Major limiting factor this session was increase in LBP and neck pain with minimal activity requiring frequent rest breaks. Patient does report that her vision becomes "fuzzy" with prolonged gaze stabilization exercises, but no significant increase in dizziness. Continue POC.     OBJECTIVE IMPAIRMENTS decreased activity tolerance, decreased balance, decreased mobility, difficulty walking, decreased ROM, dizziness, impaired sensation, postural dysfunction, and pain.   ACTIVITY LIMITATIONS bending, squatting, stairs, transfers, and locomotion level  PARTICIPATION LIMITATIONS: driving, community activity, and occupation  PERSONAL FACTORS Age, Behavior pattern,  Past/current experiences, Time since onset of injury/illness/exacerbation, and 3+ comorbidities: Anxiety, Arthritis, Bronchitis, Asthma, Chronic Back Pain, GERD, HTN, Migraines, PVD, Seizures  are also affecting patient's functional outcome.   REHAB POTENTIAL: Good  CLINICAL DECISION MAKING: Evolving/moderate complexity  EVALUATION COMPLEXITY: Moderate   PLAN: PT FREQUENCY: 2x/week  PT DURATION: 6 weeks  PLANNED INTERVENTIONS: Therapeutic exercises, Therapeutic activity, Neuromuscular re-education, Balance training, Gait training, Patient/Family education, Self Care, Joint mobilization, Stair training, Vestibular training, Canalith repositioning, Manual therapy, and Re-evaluation  PLAN FOR NEXT SESSION: Check HEP issued on 09-19-22 for any questions/problems:  progress standing with EC on floor to pillows for HEP if pt able to tolerate: dynamic  balance with vestibular input incorporated - habituation - bending down to floor, etc; busy background tasks as she tolerates   Debbora Dus, PT Debbora Dus, PT, DPT, CBIS  09/27/2022, 12:04 PM   Managed medicaid CPT codes: [phone] - PT Re-evaluation, [phone]- Therapeutic Exercise, [phone]- Neuro Re-education, [phone] - Gait Training, [phone] - Manual Therapy, [address] - Therapeutic Activities, [id number] - Self Care, and [phone] - Canalith Repositioning

## 2022-09-27 NOTE — Progress Notes (Signed)
Name: [name]  MRN/ DOB: [id number], 08-20-1981    Age/ Sex: 41 y.o., female    PCP: Beverley Fiedler, FNP   Reason for Endocrinology Evaluation: Hyperthyroidism     Date of Initial Endocrinology Evaluation: 03/22/2022    HPI: [name] is a 41 y.o. female with a past medical history of Migraine headaches and asthma . The patient presented for initial endocrinology clinic visit on 03/22/2022 for consultative assistance with her Hyperthyroidism.   Pt has been not with slightly low TSh at 0.333uIU/mL during routine labs 02/2022  TRAb undetectable Thyroid ultrasound was ordered in March 2023 but by her visit October 2020.  This was not done, we did not pursue this anymore due to normalization of her TFTs  She is S/P hysterectomy   Cousin with thyroid disease    SUBJECTIVE:    Today (09/27/22):  [name] is here for follow-up on hyperthyroid  Weight continues to fluctuate  She continues  palpitations , had an echo  She continues with irregular bowel movements No biotin use  Denies local neck swelling but has occasional tender lymph nodes Anxiety has been stable    HISTORY:  Past Medical History:  Past Medical History:  Diagnosis Date   Anxiety    Arthritis    Asthma    meds as needed   Bronchitis    Chronic back pain    Ectopic pregnancy    requiring surgery   Elevated liver enzymes    GERD (gastroesophageal reflux disease)    Gonorrhea    Hypertension    Irritable bowel    Migraine headache    Peripheral vascular disease ([place])    Pneumonia    2017   Preterm labor    Seizures ([facility])    fever induced during childhood   Past Surgical History:  Past Surgical History:  Procedure Laterality Date   ABDOMINAL HYSTERECTOMY     DILITATION & CURRETTAGE/HYSTROSCOPY WITH NOVASURE ABLATION N/A 02/20/2016   Procedure: HYSTEROSCOPY WITH NOVASURE ABLATION;  Surgeon: [name], MD;  Location: [facility];  Service:  Gynecology;  Laterality: N/A;   LAPAROSCOPIC VAGINAL HYSTERECTOMY WITH SALPINGECTOMY Left 11/18/2017   Procedure: LAPAROSCOPIC ASSISTED VAGINAL HYSTERECTOMY WITH SALPINGECTOMY;  Surgeon: Emily Filbert, MD;  Location: [facility];  Service: Gynecology;  Laterality: Left;   LAPAROSCOPY  05/30/2012   Procedure: LAPAROSCOPY OPERATIVE;  Surgeon: Osborne Oman, MD;  Location: [facility];  Service: Gynecology;  Laterality: N/A;  Operative laparoscopy right salpingectomy and removal of ectopic pregnancy   LAPAROSCOPY FOR ECTOPIC PREGNANCY     SALPINGECTOMY     TUBAL LIGATION  2005   WISDOM TOOTH EXTRACTION      Social History:  reports that she quit smoking about 6 years ago. Her smoking use included cigarettes. She has a 4.00 pack-year smoking history. She has never used smokeless tobacco. She reports that she does not currently use alcohol after a past usage of about 1.0 standard drink of alcohol per week. She reports that she does not use drugs. Family History: family history includes Asthma in her brother; Breast cancer in her cousin; Prostate cancer in her father.   HOME MEDICATIONS: Allergies as of 09/27/2022       Reactions   Latex Hives   Raspberry Hives, Itching, Swelling   Vioxx [rofecoxib] Hives        Medication List        Accurate as of September 27, 2022 11:10 AM. If you  have any questions, ask your nurse or doctor.          acetaminophen 650 MG CR tablet Commonly known as: TYLENOL Take 1,300 mg by mouth every 8 (eight) hours as needed for pain.   Ajovy 225 MG/1.5ML Soaj Generic drug: Fremanezumab-vfrm INJECT 225 MG INTO THE SKIN EVERY 30 (THIRTY) DAYS.   albuterol 108 (90 Base) MCG/ACT inhaler Commonly known as: VENTOLIN HFA Inhale 1-2 puffs into the lungs every 6 (six) hours as needed for wheezing or shortness of breath.   Emgality 120 MG/ML Soaj Generic drug: Galcanezumab-gnlm Inject 240 mg into the skin every 30 (thirty) days AND 120 mg every 30 (thirty) days.    meclizine 25 MG tablet Commonly known as: ANTIVERT Take 25 mg by mouth daily as needed.   ondansetron 4 MG disintegrating tablet Commonly known as: ZOFRAN-ODT Take 1 tablet (4 mg total) by mouth every 8 (eight) hours as needed for nausea or vomiting.   rizatriptan 10 MG disintegrating tablet Commonly known as: Maxalt-MLT Take 1 tablet (10 mg total) by mouth as needed. May repeat in 2 hours if needed   topiramate 100 MG tablet Commonly known as: TOPAMAX One in the morning, 2 at night   triamterene-hydrochlorothiazide 37.5-25 MG tablet Commonly known as: MAXZIDE-25 Take 1 tablet by mouth daily.   Vitamin D (Ergocalciferol) 1.25 MG (50000 UNIT) Caps capsule Commonly known as: DRISDOL Take 50,000 Units by mouth once a week.          REVIEW OF SYSTEMS: A comprehensive ROS was conducted with the patient and is negative except as per HPI and below:  ROS     OBJECTIVE:  VS: BP 134/86 (BP Location: Left Arm, Patient Position: Sitting, Cuff Size: Large)   Pulse 98   Ht '5\' 5"'$  (1.651 m)   Wt 200 lb 9.6 oz (91 kg)   LMP 11/14/2017   SpO2 99%   BMI 33.38 kg/m    Wt Readings from Last 3 Encounters:  08/29/22 196 lb (88.9 kg)  08/15/22 202 lb (91.6 kg)  07/31/22 203 lb 9.6 oz (92.4 kg)     EXAM: General: Pt appears well and is in NAD  Eyes: External eye exam normal without stare, lid lag or exophthalmos.  EOM intact.   Neck: General: Supple without adenopathy. Thyroid: Thyroid size normal.  No goiter or nodules appreciated.   Lungs: Clear with good BS bilat with no rales, rhonchi, or wheezes  Heart: Auscultation: RRR.  Abdomen: Normoactive bowel sounds, soft, nontender, without masses or organomegaly palpable  Extremities:  BL LE: No pretibial edema normal ROM and strength.  Mental Status: Judgment, insight: Intact Orientation: Oriented to time, place, and person Mood and affect: No depression, anxiety, or agitation     DATA REVIEWED:  Latest Reference Range &  Units 09/27/22 13:33  Calcium 8.7 - 10.2 mg/dL 8.7  Alkaline Phosphatase 44 - 121 IU/L 143 (H)  BONE FRACTION  WILL FOLLOW (P)  INTESTINAL FRAC.  WILL FOLLOW (P)  LIVER FRACTION  WILL FOLLOW (P)  VITD 30.00 - 100.00 ng/mL 16.03 (L)    Latest Reference Range & Units 09/27/22 13:33  PTH, Intact 15 - 65 pg/mL 34  PTH Interp  Comment  TSH 0.35 - 5.50 uIU/mL 0.49  Triiodothyronine (T3) 71 - 180 ng/dL 125  T4,Free(Direct) 0.60 - 1.60 ng/dL 0.88     ASSESSMENT/PLAN/RECOMMENDATIONS:   Subclinical Hyperthyroidism   -Patient  with palpitations and anxiety, we initially opted to treat given these symptoms but her TFTs  have come back normal and there is no indication to start thionamides therapy -No local neck symptoms -Discussed differential diagnosis of Graves' disease versus autonomous multinodular goiter    2. Vitamin D Deficiency :  -We will replenish with ergocalciferol 50,000 IU weekly -Patient will be advised after completing prescription to go to over-the-counter vitamin D3 2000 IU daily   3. Elevated Alkaline phosphatase:   - Elevated Alkaline phosphatase could be of hepatic, intestinal or bone origin. - Elevated Alkaline phosphatase of bone origin is due to increase osteoblastic activity.  - Causes include : hyperthyroidism, hyperparathyroidism, osteomalacia , recent fractures, paget's disease or familial causes.  -Awaiting on alkaline phosphatase assess times   Follow-up 4 months    Signed electronically by: Mack Guise, MD  [facility] End[facility]in[facility]  [facility] [address], [place], [place] [address] Phone: [phone] FAX: [phone]   CC: Beverley Fiedler, [place] [address] Phone: [phone] Fax: [phone]   Return to Endocrinology clinic as below: Future Appointments  Date Time Provider [place]  09/27/2022  1:00 PM Dashanae Longfield, Melanie Crazier, MD [facility] None   09/30/2022 10:15 AM Guido Sander, PT [facility] [place]  10/02/2022 11:00 AM Arliss Journey, PT [facility]  10/07/2022 10:15 AM Guido Sander, PT [facility]  10/09/2022 11:00 AM Debbora Dus, PT [facility]  10/14/2022 11:00 AM Arliss Journey, PT [facility]  10/16/2022 11:00 AM Debbora Dus, PT [facility]  10/21/2022 11:00 AM Guido Sander, PT [facility]  10/23/2022 11:00 AM [place], Vashti Hey, PT [facility]

## 2022-09-29 LAB — TRAB (TSH RECEPTOR BINDING ANTIBODY): TRAB: <1 IU/L (ref <=2.00)

## 2022-09-30 ENCOUNTER — Encounter: Payer: Self-pay | Admitting: Physical Therapy

## 2022-09-30 ENCOUNTER — Ambulatory Visit: Payer: Medicaid Other | Admitting: Physical Therapy

## 2022-09-30 DIAGNOSIS — R42 Dizziness and giddiness: Secondary | ICD-10-CM | POA: Diagnosis not present

## 2022-09-30 DIAGNOSIS — R2681 Unsteadiness on feet: Secondary | ICD-10-CM

## 2022-09-30 MED ORDER — VITAMIN D (ERGOCALCIFEROL) 1.25 MG (50000 UNIT) PO CAPS: 50000.0000 [IU] | ORAL_CAPSULE | ORAL | 1 refills | Status: AC

## 2022-09-30 NOTE — Therapy (Signed)
OUTPATIENT PHYSICAL THERAPY VESTIBULAR TREATMENT NOTE     Patient Name: [name] MRN: [id number] DOB:1981-03-08, 41 y.o., female Today's Date: 09/30/2022  PCP: No PCP on File REFERRING PROVIDER: Marcial Pacas, MD    PT End of Session - 09/30/22 1849     Visit Number 5    Number of Visits 13    Date for PT Re-Evaluation 11/10/22    Authorization Type [employer] Medicaid (VL: 27)    PT Start Time 1020    PT Stop Time 1103    PT Time Calculation (min) 43 min    Equipment Utilized During Treatment --    Activity Tolerance Patient tolerated treatment well    Behavior During Therapy WFL for tasks assessed/performed               Past Medical History:  Diagnosis Date   Anxiety    Arthritis    Asthma    meds as needed   Bronchitis    Chronic back pain    Ectopic pregnancy    requiring surgery   Elevated liver enzymes    GERD (gastroesophageal reflux disease)    Gonorrhea    Hypertension    Irritable bowel    Migraine headache    Peripheral vascular disease ([place])    Pneumonia    2017   Preterm labor    Seizures ([place])    fever induced during childhood   Past Surgical History:  Procedure Laterality Date   ABDOMINAL HYSTERECTOMY     DILITATION & CURRETTAGE/HYSTROSCOPY WITH NOVASURE ABLATION N/A 02/20/2016   Procedure: HYSTEROSCOPY WITH NOVASURE ABLATION;  Surgeon: [name], MD;  Location: [facility];  Service: Gynecology;  Laterality: N/A;   LAPAROSCOPIC VAGINAL HYSTERECTOMY WITH SALPINGECTOMY Left 11/18/2017   Procedure: LAPAROSCOPIC ASSISTED VAGINAL HYSTERECTOMY WITH SALPINGECTOMY;  Surgeon: Emily Filbert, MD;  Location: [facility];  Service: Gynecology;  Laterality: Left;   LAPAROSCOPY  05/30/2012   Procedure: LAPAROSCOPY OPERATIVE;  Surgeon: Osborne Oman, MD;  Location: [facility];  Service: Gynecology;  Laterality: N/A;  Operative laparoscopy right salpingectomy and removal of ectopic pregnancy   LAPAROSCOPY FOR ECTOPIC PREGNANCY     SALPINGECTOMY      TUBAL LIGATION  2005   WISDOM TOOTH EXTRACTION     Patient Active Problem List   Diagnosis Date Noted   Incontinence of feces 08/29/2022   Gait abnormality 08/29/2022   Syncope and collapse 03/27/2022   Alteration consciousness 11/23/2021   Chronic migraine w/o aura w/o status migrainosus, not intractable 11/23/2021   Vitamin D deficiency 09/07/2019   Lower abdominal pain 01/22/2019   Post-operative state 11/18/2017   Fibroid uterus 11/02/2015   Abnormal uterine bleeding (AUB) 11/02/2015   Asthma exacerbation 03/23/2015   Tobacco abuse 03/23/2015   History of migraine 03/23/2015   Acute asthma exacerbation    Cough    Acute bronchitis    Asthma 03/22/2015   Migraine headache 03/07/2014    ONSET DATE: 08/29/2022 (date of referral)  REFERRING DIAG: G43.709 (ICD-10-CM) - Chronic migraine w/o aura w/o status migrainosus, not intractable R26.9 (ICD-10-CM) - Gait abnormality   THERAPY DIAG:  Dizziness and giddiness  Unsteadiness on feet  Rationale for Evaluation and Treatment Rehabilitation  SUBJECTIVE:   SUBJECTIVE STATEMENT: Patient reports she has been doing exercises at home; reports she got dizzy going up steps into an attic this weekend - did not go all the way up (went approx. 3/4 way) and then stopped due to getting dizzy; has been practicing going up/down  steps at home - gets more dizzy with looking down when descending steps than with ascending steps      Pt accompanied by: self  PERTINENT HISTORY: Anxiety, Arthritis, Bronchitis, Asthma, Chronic Back Pain, GERD, HTN, Migraines, PVD, Seizures   PAIN:    Are you having pain? Yes: NPRS scale: 6/10 Pain location: Back & Neck; primarily L side Pain description: Throbbing & sharp  Aggravating factors: Movement Relieving factors: Some exercises that she has previously learned from the chiropractor; heat/ice; back brace    PRECAUTIONS: Fall, No driving.   WEIGHT BEARING RESTRICTIONS No  FALLS: Has patient fallen  in last 6 months? Yes. Number of falls ~10   PATIENT GOALS Wants to stop being dizzy. Wants to be be able to bend down and pick something up without being dizzy.    VESTIBULAR TREATMENT:  09-30-22  Neuro Re-ed:  Standing Balance: Surface: Pillows Position: Narrow Base of Support Completed with: Eyes Open and Eyes Closed; Head Turns x 5 Reps and Head Nods x 5 Reps Marching on pillows with EO 10 reps; performed slow horizontal head turns with marching with CGA x 5 reps  Stepping down to floor with contralateral head turn horizontally 5 reps each side with CGA  Rockerboard (placed on treadmill to have UE support on bars prn); EO 10 reps with UE support prn:  10 reps with EC with 2 finger UE support on bars with CGA to min assist;  holding board steady - pt performed horizontal head turns 5 reps EO and then vertical head turns 5 reps EO  Gaze stabilization:  30 secs x 1 rep with target on patterned background with horizontal head turns with cues for correct  performance; 30 secs x 1 rep with vertical head turns with cues to keep eyes on target ; posture grid used as busy background  Pt amb. 2 reps forward/backward on busy background placed on floor; then performed forward amb. On patterned cloth on floor to pick up 1 cone, turn 180 degrees and amb. To other end of cloth and place cone back down on floor, x 3 reps as 3 cones were used for this activity for habituation - pt reported increased dizziness after this activity (7/10 dizziness intensity)     PATIENT EDUCATION: Education details: 09-30-22 -   Progressed x1 viewing exercise to patterned background for 30 secs duration - both horizontal & vertical Person educated: Patient Education method: Explanation, Demonstration and Handout  Education comprehension: verbalized understanding, returned demonstration  HOME EXERCISE PROGRAM:  Med[employer] HEP: Access Code: [email or web address] URL: [email or web address] Date: 09/19/2022 Prepared by:  Ethelene Browns  Exercises - Standing Gaze Stabilization with Head Rotation  - 3-5 x daily - 7 x weekly - 1 sets - 1 reps - 30 secs hold - Romberg Stance with Eyes Closed  - 1 x daily - 7 x weekly - 3 sets - 10 reps  GOALS: Goals reviewed with patient? Yes  SHORT TERM GOALS: Target date: 10/02/2022   Pt will be independent with initial HEP in order to build upon functional gains made in therapy. Baseline: dependent with HEP.  Goal status: INITIAL  2.  Pt will rate items on MSQ as a 3/5 or less in order to demo improved motion sensitivity.  Baseline: did not get to finish assessing at eval, starting off at 4/5.  Goal status: INITIAL  3.  mCTSIB to be assessed with LTG written.  Baseline: assessed; goal not needed Goal status: MET  4.  FGA to be  assessed with LTG writen. Baseline: 23/30 Goal status: MET   LONG TERM GOALS: Target date: 10/23/2022    Pt will be independent with final HEP in order to build upon functional gains made in therapy. Baseline: dependent Goal status: INITIAL  2.  Pt will rate items on MSQ as a 2/5 or less in order to demo improved motion sensitivity.  Baseline: did not get to finish assessing at eval, starting off at 4/5.  Goal status: INITIAL  3.  mCTSIB goal to be written.  Baseline: discontinued; goal not needed Goal status: MET  4.  Pt will improve FGA to >/= 28/30 to demonstrate improved balance and reduced fall risk Baseline: 23/30 Goal status: INITIAL   ASSESSMENT:  CLINICAL IMPRESSION: Patient rated dizziness 4/10 intensity at start of session with rating 7-8/10 at end of session, upon completion of final activity which involved amb. On busy patterned background on floor, bending down to pick up cone and turning to place cone back down on floor at other end of patterned cloth.  Pt needed cues for correct technique for gaze stabilization exercise.  Pt needed minimal rest breaks during today's PT session, overall tolerating exercises well.   Continue POC.     OBJECTIVE IMPAIRMENTS decreased activity tolerance, decreased balance, decreased mobility, difficulty walking, decreased ROM, dizziness, impaired sensation, postural dysfunction, and pain.   ACTIVITY LIMITATIONS bending, squatting, stairs, transfers, and locomotion level  PARTICIPATION LIMITATIONS: driving, community activity, and occupation  PERSONAL FACTORS Age, Behavior pattern, Past/current experiences, Time since onset of injury/illness/exacerbation, and 3+ comorbidities: Anxiety, Arthritis, Bronchitis, Asthma, Chronic Back Pain, GERD, HTN, Migraines, PVD, Seizures  are also affecting patient's functional outcome.   REHAB POTENTIAL: Good  CLINICAL DECISION MAKING: Evolving/moderate complexity  EVALUATION COMPLEXITY: Moderate   PLAN: PT FREQUENCY: 2x/week  PT DURATION: 6 weeks  PLANNED INTERVENTIONS: Therapeutic exercises, Therapeutic activity, Neuromuscular re-education, Balance training, Gait training, Patient/Family education, Self Care, Joint mobilization, Stair training, Vestibular training, Canalith repositioning, Manual therapy, and Re-evaluation  PLAN FOR NEXT SESSION:  Check STG's #1 & 2 next session:  how did x1 viewing exercise go with use of patterned background?  progress standing with EC on floor to pillows for HEP if pt able to tolerate: dynamic balance with vestibular input incorporated - habituation - bending down to floor, etc; busy background tasks as she tolerates   Iza Preston, [place], PT  09/30/2022, 6:51 PM   Managed medicaid CPT codes: [phone] - PT Re-evaluation, 97110- Therapeutic Exercise, [phone]- Neuro Re-education, [phone] - Gait Training, Show [place], 97530 - Therapeutic Activities, 97535 - [place], and [phone] - Canalith Repositioning

## 2022-10-02 ENCOUNTER — Ambulatory Visit: Payer: Medicaid Other | Admitting: Physical Therapy

## 2022-10-02 LAB — ALKALINE PHOSPHATASE, ISOENZYMES
Alkaline Phosphatase: 143 IU/L — ABNORMAL HIGH (ref 44–121)
BONE FRACTION: 31 % (ref 14–68)
INTESTINAL FRAC.: 2 % (ref 0–18)
LIVER FRACTION: 67 % (ref 18–85)

## 2022-10-02 LAB — PTH, INTACT AND CALCIUM
Calcium: 8.7 mg/dL (ref 8.7–10.2)
PTH: 34 pg/mL (ref 15–65)

## 2022-10-02 LAB — T3: T3, Total: 125 ng/dL (ref 71–180)

## 2022-10-03 ENCOUNTER — Emergency Department (HOSPITAL_BASED_OUTPATIENT_CLINIC_OR_DEPARTMENT_OTHER)
Admission: EM | Admit: 2022-10-03 | Discharge: 2022-10-04 | Disposition: A | Discharge status: Home / Self Care | Payer: Medicaid Other | Attending: Emergency Medicine | Admitting: Emergency Medicine

## 2022-10-03 ENCOUNTER — Encounter (HOSPITAL_BASED_OUTPATIENT_CLINIC_OR_DEPARTMENT_OTHER): Payer: Self-pay | Admitting: Emergency Medicine

## 2022-10-03 ENCOUNTER — Other Ambulatory Visit: Payer: Self-pay

## 2022-10-03 DIAGNOSIS — J45909 Unspecified asthma, uncomplicated: Secondary | ICD-10-CM | POA: Diagnosis not present

## 2022-10-03 DIAGNOSIS — Z87891 Personal history of nicotine dependence: Secondary | ICD-10-CM | POA: Insufficient documentation

## 2022-10-03 DIAGNOSIS — K219 Gastro-esophageal reflux disease without esophagitis: Secondary | ICD-10-CM | POA: Diagnosis not present

## 2022-10-03 DIAGNOSIS — I10 Essential (primary) hypertension: Secondary | ICD-10-CM | POA: Diagnosis not present

## 2022-10-03 DIAGNOSIS — R1032 Left lower quadrant pain: Secondary | ICD-10-CM

## 2022-10-03 DIAGNOSIS — Z79899 Other long term (current) drug therapy: Secondary | ICD-10-CM | POA: Diagnosis not present

## 2022-10-03 DIAGNOSIS — Z91040 Latex allergy status: Secondary | ICD-10-CM | POA: Insufficient documentation

## 2022-10-03 DIAGNOSIS — R102 Pelvic and perineal pain: Secondary | ICD-10-CM | POA: Insufficient documentation

## 2022-10-03 LAB — CBC WITH DIFFERENTIAL/PLATELET
Abs Immature Granulocytes: 0.04 10*3/uL (ref 0.00–0.07)
Basophils Absolute: 0.1 10*3/uL (ref 0.0–0.1)
Basophils Relative: 1 %
Eosinophils Absolute: 0.2 10*3/uL (ref 0.0–0.5)
Eosinophils Relative: 2 %
HCT: 45.4 % (ref 36.0–46.0)
Hemoglobin: 14.7 g/dL (ref 12.0–15.0)
Immature Granulocytes: 0 %
Lymphocytes Relative: 40 %
Lymphs Abs: 5.1 10*3/uL — ABNORMAL HIGH (ref 0.7–4.0)
MCH: 29.9 pg (ref 26.0–34.0)
MCHC: 32.4 g/dL (ref 30.0–36.0)
MCV: 92.3 fL (ref 80.0–100.0)
Monocytes Absolute: 1 10*3/uL (ref 0.1–1.0)
Monocytes Relative: 8 %
Neutro Abs: 6.3 10*3/uL (ref 1.7–7.7)
Neutrophils Relative %: 49 %
Platelets: 283 10*3/uL (ref 150–400)
RBC: 4.92 MIL/uL (ref 3.87–5.11)
RDW: 13 % (ref 11.5–15.5)
WBC: 12.8 10*3/uL — ABNORMAL HIGH (ref 4.0–10.5)
nRBC: 0 % (ref 0.0–0.2)

## 2022-10-03 LAB — COMPREHENSIVE METABOLIC PANEL
ALT: 8 U/L (ref 0–44)
AST: 10 U/L — ABNORMAL LOW (ref 15–41)
Albumin: 4.3 g/dL (ref 3.5–5.0)
Alkaline Phosphatase: 126 U/L (ref 38–126)
Anion gap: 9 (ref 5–15)
BUN: 11 mg/dL (ref 6–20)
CO2: 18 mmol/L — ABNORMAL LOW (ref 22–32)
Calcium: 9.3 mg/dL (ref 8.9–10.3)
Chloride: 110 mmol/L (ref 98–111)
Creatinine, Ser: 0.86 mg/dL (ref 0.44–1.00)
GFR, Estimated: >60 mL/min (ref >60)
Glucose, Bld: 89 mg/dL (ref 70–99)
Potassium: 3.8 mmol/L (ref 3.5–5.1)
Sodium: 137 mmol/L (ref 135–145)
Total Bilirubin: 0.3 mg/dL (ref 0.3–1.2)
Total Protein: 7.6 g/dL (ref 6.5–8.1)

## 2022-10-03 LAB — HCG, SERUM, QUALITATIVE: Preg, Serum: NEGATIVE

## 2022-10-03 LAB — LIPASE, BLOOD: Lipase: 41 U/L (ref 11–51)

## 2022-10-03 MED ORDER — ONDANSETRON HCL 4 MG/2ML IJ SOLN
4.0000 mg | Freq: Once | INTRAMUSCULAR | Status: AC
  Administered 2022-10-04: 4 mg via INTRAVENOUS
  Filled 2022-10-03: qty 2

## 2022-10-03 MED ORDER — FENTANYL CITRATE PF 50 MCG/ML IJ SOSY
100.0000 ug | PREFILLED_SYRINGE | Freq: Once | INTRAMUSCULAR | Status: AC
  Administered 2022-10-04: 100 ug via INTRAVENOUS
  Filled 2022-10-03: qty 2

## 2022-10-03 NOTE — ED Notes (Signed)
ED Provider at bedside for rectal exam and hemoccult collection. This RN present as chaperone.

## 2022-10-03 NOTE — ED Triage Notes (Addendum)
Pt arrives to ED with c/o left sided pelvic pain and LLQ abdominal pain x1 week. The pain has been intermittent x1 years however has been consistent over the past week. She notes dark stools.

## 2022-10-03 NOTE — ED Provider Notes (Signed)
[facility] Provider Note: Georgena Spurling, MD, FACEP  CSN: [id number] MRN: [id number] ARRIVAL: 10/03/22 at 34 ROOM: [place]  Pelvic Pain   HISTORY OF PRESENT ILLNESS  10/03/22 11:17 PM [name] is a 41 y.o. female with almost 2 weeks of left lower quadrant abdominal pain.  The pain has been intermittent for about a year but has been consistent for the past week.  She describes the pain as feeling like spasms or contractions.  She denies vaginal bleeding or discharge.  She denies urinary symptoms.  She is nauseated as a result of the pain.  She rates the pain as a 10 out of 10, worse with movement or palpation.  She has also noticed her stools being dark.  She has not vomited.   Past Medical History:  Diagnosis Date   Anxiety    Arthritis    Asthma    meds as needed   Bronchitis    Chronic back pain    Ectopic pregnancy    requiring surgery   Elevated liver enzymes    GERD (gastroesophageal reflux disease)    Gonorrhea    Hypertension    Irritable bowel    Migraine headache    Peripheral vascular disease ([place])    Pneumonia    2017   Preterm labor    Seizures ([facility])    fever induced during childhood    Past Surgical History:  Procedure Laterality Date   ABDOMINAL HYSTERECTOMY     DILITATION & CURRETTAGE/HYSTROSCOPY WITH NOVASURE ABLATION N/A 02/20/2016   Procedure: HYSTEROSCOPY WITH NOVASURE ABLATION;  Surgeon: [name], MD;  Location: [facility];  Service: Gynecology;  Laterality: N/A;   LAPAROSCOPIC VAGINAL HYSTERECTOMY WITH SALPINGECTOMY Left 11/18/2017   Procedure: LAPAROSCOPIC ASSISTED VAGINAL HYSTERECTOMY WITH SALPINGECTOMY;  Surgeon: Emily Filbert, MD;  Location: [facility];  Service: Gynecology;  Laterality: Left;   LAPAROSCOPY  05/30/2012   Procedure: LAPAROSCOPY OPERATIVE;  Surgeon: Osborne Oman, MD;  Location: [facility];  Service: Gynecology;  Laterality: N/A;  Operative laparoscopy right salpingectomy and  removal of ectopic pregnancy   LAPAROSCOPY FOR ECTOPIC PREGNANCY     SALPINGECTOMY     TUBAL LIGATION  2005   WISDOM TOOTH EXTRACTION      Family History  Problem Relation Age of Onset   Prostate cancer Father    Asthma Brother    Breast cancer Cousin     Social History   Tobacco Use   Smoking status: Former    Packs/day: 0.20    Years: 20.00    Total pack years: 4.00    Types: Cigarettes    Quit date: 05/04/2016    Years since quitting: 6.4   Smokeless tobacco: Never   Tobacco comments:    "2 packs a month if that" now   Vaping Use   Vaping Use: Former  Substance Use Topics   Alcohol use: Not Currently    Alcohol/week: 1.0 standard drink of alcohol    Types: 1 Glasses of wine per week    Comment: occasional   Drug use: No    Prior to Admission medications   Medication Sig Start Date End Date Taking? Authorizing Provider  acetaminophen (TYLENOL) 650 MG CR tablet Take 1,300 mg by mouth every 8 (eight) hours as needed for pain.    [provider]  albuterol (VENTOLIN HFA) 108 (90 Base) MCG/ACT inhaler Inhale 1-2 puffs into the lungs every 6 (six) hours as needed for wheezing or shortness  of breath. 11/09/19   Ok Edwards, PA-C  meclizine (ANTIVERT) 25 MG tablet Take 25 mg by mouth daily as needed. 11/09/21   [provider]  ondansetron (ZOFRAN-ODT) 4 MG disintegrating tablet Take 1 tablet (4 mg total) by mouth every 8 (eight) hours as needed for nausea or vomiting. 11/23/21   Marcial Pacas, MD  rizatriptan (MAXALT-MLT) 10 MG disintegrating tablet Take 1 tablet (10 mg total) by mouth as needed. May repeat in 2 hours if needed 11/23/21   Marcial Pacas, MD  topiramate (TOPAMAX) 100 MG tablet One in the morning, 2 at night 03/19/22   Marcial Pacas, MD  triamterene-hydrochlorothiazide (MAXZIDE-25) 37.5-25 MG tablet Take 1 tablet by mouth daily. 11/09/21   [provider]  Vitamin D, Ergocalciferol, (DRISDOL) 1.25 MG (50000 UNIT) CAPS capsule Take 1 capsule (50,000  Units total) by mouth once a week. 09/30/22   Shamleffer, Melanie Crazier, MD    Allergies Latex, Raspberry, and Vioxx [rofecoxib]   REVIEW OF SYSTEMS  Negative except as noted here or in the History of Present Illness.   PHYSICAL EXAMINATION  Initial Vital Signs Blood pressure (!) 148/102, pulse 67, temperature 98 F (36.7 C), temperature source Oral, resp. rate 18, last menstrual period 11/14/2017, SpO2 100 %.  Examination General: Well-developed, well-nourished female in no acute distress; appearance consistent with age of record HENT: normocephalic; atraumatic Eyes: Normal appearance Neck: supple Heart: regular rate and rhythm Lungs: clear to auscultation bilaterally Abdomen: soft; nondistended; left lower quadrant tenderness (lateral not suprapubic); bowel sounds present Rectal: Normal sphincter tone; no masses palpated; no formed stool palpated in vault; sample sent for Hemoccult testing Extremities: No deformity; full range of motion; pulses normal Neurologic: Awake, alert and oriented; motor function intact in all extremities and symmetric; no facial droop Skin: Warm and dry Psychiatric: Normal mood and affect   RESULTS  Summary of this visit's results, reviewed and interpreted by myself:   EKG Interpretation  Date/Time:    Ventricular Rate:    PR Interval:    QRS Duration:   QT Interval:    QTC Calculation:   R Axis:     Text Interpretation:         Laboratory Studies: Results for orders placed or performed during the hospital encounter of 10/03/22 (from the past 24 hour(s))  CBC with Differential     Status: Abnormal   Collection Time: 10/03/22  5:44 PM  Result Value Ref Range   WBC 12.8 (H) 4.0 - 10.5 K/uL   RBC 4.92 3.87 - 5.11 MIL/uL   Hemoglobin 14.7 12.0 - 15.0 g/dL   HCT 45.4 36.0 - 46.0 %   MCV 92.3 80.0 - 100.0 fL   MCH 29.9 26.0 - 34.0 pg   MCHC 32.4 30.0 - 36.0 g/dL   RDW 13.0 11.5 - 15.5 %   Platelets 283 150 - 400 K/uL   nRBC 0.0 0.0  - 0.2 %   Neutrophils Relative % 49 %   Neutro Abs 6.3 1.7 - 7.7 K/uL   Lymphocytes Relative 40 %   Lymphs Abs 5.1 (H) 0.7 - 4.0 K/uL   Monocytes Relative 8 %   Monocytes Absolute 1.0 0.1 - 1.0 K/uL   Eosinophils Relative 2 %   Eosinophils Absolute 0.2 0.0 - 0.5 K/uL   Basophils Relative 1 %   Basophils Absolute 0.1 0.0 - 0.1 K/uL   Immature Granulocytes 0 %   Abs Immature Granulocytes 0.04 0.00 - 0.07 K/uL  hCG, serum, qualitative  Status: None   Collection Time: 10/03/22  5:44 PM  Result Value Ref Range   Preg, Serum NEGATIVE NEGATIVE  Comprehensive metabolic panel     Status: Abnormal   Collection Time: 10/03/22  5:44 PM  Result Value Ref Range   Sodium 137 135 - 145 mmol/L   Potassium 3.8 3.5 - 5.1 mmol/L   Chloride 110 98 - 111 mmol/L   CO2 18 (L) 22 - 32 mmol/L   Glucose, Bld 89 70 - 99 mg/dL   BUN 11 6 - 20 mg/dL   Creatinine, Ser 0.86 0.44 - 1.00 mg/dL   Calcium 9.3 8.9 - 10.3 mg/dL   Total Protein 7.6 6.5 - 8.1 g/dL   Albumin 4.3 3.5 - 5.0 g/dL   AST 10 (L) 15 - 41 U/L   ALT 8 0 - 44 U/L   Alkaline Phosphatase 126 38 - 126 U/L   Total Bilirubin 0.3 0.3 - 1.2 mg/dL   GFR, Estimated >60 >60 mL/min   Anion gap 9 5 - 15  Lipase, blood     Status: None   Collection Time: 10/03/22  5:44 PM  Result Value Ref Range   Lipase 41 11 - 51 U/L  Occult blood card to lab, stool Provider will collect     Status: None   Collection Time: 10/04/22 12:37 AM  Result Value Ref Range   Fecal Occult Bld NEGATIVE NEGATIVE   Imaging Studies: CT ABDOMEN PELVIS W CONTRAST  Result Date: 10/04/2022 CLINICAL DATA:  Left left-sided pelvic pain and left lower quadrant abdominal pain for 1 week with intermittent pain for 1 year. EXAM: CT ABDOMEN AND PELVIS WITH CONTRAST TECHNIQUE: Multidetector CT imaging of the abdomen and pelvis was performed using the standard protocol following bolus administration of intravenous contrast. RADIATION DOSE REDUCTION: This exam was performed according to  the departmental dose-optimization program which includes automated exposure control, adjustment of the mA and/or kV according to patient size and/or use of iterative reconstruction technique. CONTRAST:  78m OMNIPAQUE IOHEXOL 300 MG/ML  SOLN COMPARISON:  11/27/2021. FINDINGS: Lower chest: No acute abnormality. Hepatobiliary: No focal liver abnormality is seen. No gallstones, gallbladder wall thickening, or biliary dilatation. Pancreas: Unremarkable. No pancreatic ductal dilatation or surrounding inflammatory changes. Spleen: Normal in size without focal abnormality. Adrenals/Urinary Tract: Adrenal glands are unremarkable. Kidneys are normal, without renal calculi, focal lesion, or hydronephrosis. Bladder is unremarkable. Stomach/Bowel: Stomach is within normal limits. Appendix appears normal. No evidence of bowel wall thickening, distention, or inflammatory changes. No free air or pneumatosis. Vascular/Lymphatic: No significant vascular findings are present. No enlarged abdominal or pelvic lymph nodes. Reproductive: Status post hysterectomy. No adnexal masses. Other: No abdominopelvic ascites. A fat containing umbilical hernia is noted. Musculoskeletal: No acute or suspicious osseous abnormality. IMPRESSION: No abnormality to explain patient's reported left lower quadrant pain. Electronically Signed   By: LBrett FairyM.D.   On: 10/04/2022 00:35    ED COURSE and MDM  Nursing notes, initial and subsequent vitals signs, including pulse oximetry, reviewed and interpreted by myself.  Vitals:   10/03/22 2212 10/03/22 2230 10/03/22 2315 10/04/22 0100  BP: (!) 154/114 (!) 148/102 133/85 125/86  Pulse: 71 67 63 (!) 58  Resp: '18  20 18  '$ Temp: 98 F (36.7 C)     TempSrc: Oral     SpO2: 100% 100% 100% 100%   Medications  ondansetron (ZOFRAN) injection 4 mg (4 mg Intravenous Given 10/04/22 0003)  fentaNYL (SUBLIMAZE) injection 100 mcg (100 mcg Intravenous Given  10/04/22 0003)  iohexol (OMNIPAQUE) 300 MG/ML  solution 100 mL (80 mLs Intravenous Contrast Given 10/04/22 0015)  diphenhydrAMINE (BENADRYL) injection 25 mg (25 mg Intravenous Given 10/04/22 0043)   The patient's pain is in the low part of the left lower quadrant essentially at the groin fold although there is some tenderness more proximally.  The CT scan showed no evidence of intra-abdominal pathology such as diverticulitis or ovarian cyst to explain the pain.  I suspect this may represent musculoskeletal pain.  Her stool was Hemoccult negative.  I do not believe she needs narcotic pain medication but she was advised to rest the affected area.   PROCEDURES  Procedures   ED DIAGNOSES     ICD-10-CM   1. Left groin pain  R10.32          Shanon Rosser, MD 10/04/22 0110

## 2022-10-04 ENCOUNTER — Emergency Department (HOSPITAL_BASED_OUTPATIENT_CLINIC_OR_DEPARTMENT_OTHER): Payer: Medicaid Other

## 2022-10-04 LAB — OCCULT BLOOD X 1 CARD TO LAB, STOOL: Fecal Occult Bld: NEGATIVE

## 2022-10-04 MED ORDER — DIPHENHYDRAMINE HCL 50 MG/ML IJ SOLN
25.0000 mg | Freq: Once | INTRAMUSCULAR | Status: AC
  Administered 2022-10-04: 25 mg via INTRAVENOUS
  Filled 2022-10-04: qty 1

## 2022-10-04 MED ORDER — IOHEXOL 300 MG/ML  SOLN
100.0000 mL | Freq: Once | INTRAMUSCULAR | Status: AC | PRN
  Administered 2022-10-04: 80 mL via INTRAVENOUS

## 2022-10-07 ENCOUNTER — Ambulatory Visit: Payer: Medicaid Other | Admitting: Physical Therapy

## 2022-10-09 ENCOUNTER — Ambulatory Visit: Payer: Medicaid Other

## 2022-10-14 ENCOUNTER — Ambulatory Visit: Payer: Medicaid Other | Admitting: Physical Therapy

## 2022-10-14 ENCOUNTER — Telehealth: Payer: Self-pay | Admitting: Physical Therapy

## 2022-10-14 NOTE — Telephone Encounter (Signed)
Called pt regarding no-show appt as this is pt's 2nd no show. Had to leave voicemail. Educated on clinic's no show policy and if pt does not show to next appt, will need to cancel all future appts.   Janann August, PT, DPT 10/14/22 11:41 AM    [facility] [address] [place], [place]  [address] Phone:  [phone] Fax:  [phone]

## 2022-10-16 ENCOUNTER — Ambulatory Visit: Payer: Medicaid Other

## 2022-10-16 ENCOUNTER — Telehealth: Payer: Self-pay

## 2022-10-16 NOTE — Therapy (Unsigned)
[facility] [address] [place], [place], [address] Phone: [phone]   Fax:  [phone]  Patient Details  Name: [name] MRN: [id number] Date of Birth: [birth year] Referring Provider:  No ref. provider found  Encounter Date: 10/16/2022 PHYSICAL THERAPY DISCHARGE SUMMARY  Visits from Start of Care: 5  Current functional level related to goals / functional outcomes: Patient remains with fluctuating dizziness, but is severely impacted by her low back pain- at times impacting her ability to even get out of bed.    Remaining deficits: Fluctuating dizziness   Education / Equipment: PT POC, HEP   Patient agrees to discharge. Patient goals were  unable to be assessed as patient had multiple no-shows, same-day cancels . Patient is being discharged due to not returning since the last visit.   Debbora Dus, PT Debbora Dus, PT, DPT, CBIS  10/16/2022, 10:24 AM  [facility] [address] [place], [place], [address] Phone: [phone]   Fax:  [phone]

## 2022-10-16 NOTE — Progress Notes (Unsigned)
[name] is a 41 y.o. female who presents to [place] at [facility] today for Back and groin pain. Patient last saw Dr. Georgina Snell on 08/15/22 for post-concussion visit. Today patient states, that the pain is shooting down both sides starting from her pelvis and shoots down the front of her legs down to her feet. The pain radiating down the legs to the feet started on October 13.  She also notes urinary and fecal incontinence.  This has been ongoing for at least 2 months now.  She was seen by her neurologist for this and a further MRI of her spine was obtained including lumbar spine MRI on October 3 which did not show significant neural compression.  Her bilateral leg pain and weakness started/worsened significantly on or around October 13 about 10 days after her lumbar spine MRI.   Patient states that this pain all started in her low back. Patient was seen by her PCP and referred back to [place] so PCP and Dr. Georgina Snell can work together on her Pain management.    She notes her pain is severe and associated with weakness and incontinence and not improving.  She received a intramuscular steroid injection at her primary care provider office yesterday which helped a little.   Pertinent review of systems: No fevers or chills.  Positive for bowel bladder incontinence and leg weakness.  Left leg is weaker than right.  Relevant historical information: Migraine headache.   Exam:  BP 130/80   Pulse 60   Ht '5\' 5"'$  (1.651 m)   Wt 198 lb (89.8 kg)   LMP 11/14/2017   SpO2 98%   BMI 32.95 kg/m  General: Well Developed, well nourished, and in no acute distress.   MSK: L-spine: Normal appearing Tender palpation paraspinal musculature.  Decreased lumbar motion. Lower extremity strength left sided decreased hip flexion and knee extension and left foot dorsiflexion all rated 4-/5 Right lower extremity strength decreased right hip flexion and knee extension 4/5.  Otherwise lower  extremity strength is intact. Reflexes diminished bilaterally. Sensation is intact. Thighs bilaterally diffusely tender to palpation. Pulses slightly diminished bilaterally.    Lab and Radiology Results CT ABDOMEN PELVIS W CONTRAST  Result Date: 10/04/2022 CLINICAL DATA:  Left left-sided pelvic pain and left lower quadrant abdominal pain for 1 week with intermittent pain for 1 year. EXAM: CT ABDOMEN AND PELVIS WITH CONTRAST TECHNIQUE: Multidetector CT imaging of the abdomen and pelvis was performed using the standard protocol following bolus administration of intravenous contrast. RADIATION DOSE REDUCTION: This exam was performed according to the departmental dose-optimization program which includes automated exposure control, adjustment of the mA and/or kV according to patient size and/or use of iterative reconstruction technique. CONTRAST:  10m OMNIPAQUE IOHEXOL 300 MG/ML  SOLN COMPARISON:  11/27/2021. FINDINGS: Lower chest: No acute abnormality. Hepatobiliary: No focal liver abnormality is seen. No gallstones, gallbladder wall thickening, or biliary dilatation. Pancreas: Unremarkable. No pancreatic ductal dilatation or surrounding inflammatory changes. Spleen: Normal in size without focal abnormality. Adrenals/Urinary Tract: Adrenal glands are unremarkable. Kidneys are normal, without renal calculi, focal lesion, or hydronephrosis. Bladder is unremarkable. Stomach/Bowel: Stomach is within normal limits. Appendix appears normal. No evidence of bowel wall thickening, distention, or inflammatory changes. No free air or pneumatosis. Vascular/Lymphatic: No significant vascular findings are present. No enlarged abdominal or pelvic lymph nodes. Reproductive: Status post hysterectomy. No adnexal masses. Other: No abdominopelvic ascites. A fat containing umbilical hernia is noted. Musculoskeletal: No acute or suspicious osseous abnormality.  IMPRESSION: No abnormality to explain patient's reported left lower  quadrant pain. Electronically Signed   By: Brett Fairy M.D.   On: 10/04/2022 00:35   MR LUMBAR SPINE WO CONTRAST  Result Date: 09/26/2022 Table formatting from the original result was not included. [facility] NEURO[facility] REPORT STUDY DATE: 09/24/22 PATIENT NAME: FABIANNA [name] DOB: 06/05/1981 MRN: [id number] ORDERING CLINICIAN: Marcial Pacas, MD CLINICAL HISTORY: 41 y.o. year old female with ataxia. 1. Chronic migraine w/o aura w/o status migrainosus, not intractable  2. Incontinence of feces, unspecified fecal incontinence type   EXAM: MR LUMBAR SPINE WO CONTRAST TECHNIQUE: MRI of the lumbar spine was obtained utilizing multiplanar, multiecho pulse sequences. CONTRAST: Diagnostic Product Medications (last 72 hours)   None   COMPARISON: none IMAGING SITE: [facility] [place] [address] FINDINGS: On sagittal views the vertebral bodies have normal height and alignment.  The conus medullaris terminates at the level of L1.  On axial views: T12-L1: no spinal stenosis or foraminal narrowing L1-2: no spinal stenosis or foraminal narrowing L2-3: mild disc bulging with no spinal stenosis or foraminal narrowing L3-4: no spinal stenosis or foraminal narrowing L4-5: disc bulging and facet hypertrophy with mild right foraminal stenosis L5-S1: no spinal stenosis or foraminal narrowing Limited views of the aorta, kidneys, iliopsoas muscles and sacroiliac joints are unremarkable.   MRI lumbar spine (without) demonstrating: - At L4-5: disc bulging and facet hypertrophy with mild right foraminal stenosis. INTERPRETING PHYSICIAN: Penni Bombard, MD Certified in Neurology, Neurophysiology and Neuroimaging [facility] [address], [place], [place] [address] [phone]  MR THORACIC SPINE WO CONTRAST  Result Date: 09/26/2022 Table formatting from the original result was not included.  [facility] NEURO[facility]AG[facility] REPORT STUDY DATE: 09/24/22 PATIENT NAME: [name] DOB: 1981-05-27 MRN: [id number] ORDERING CLINICIAN: Marcial Pacas, MD CLINICAL HISTORY: 40 y.o. year old female with ataxia, thoracic trauma. 1. Chronic migraine w/o aura w/o status migrainosus, not intractable  2. Incontinence of feces, unspecified fecal incontinence type   EXAM: MR THORACIC SPINE WO CONTRAST TECHNIQUE: MRI of the thoracic spine was obtained utilizing multiplanar, multiecho pulse sequences. CONTRAST: Diagnostic Product Medications (last 72 hours)   None   COMPARISON: none IMAGING SITE: [facility] [place] [address] FINDINGS: On sagittal views the vertebral bodies have normal height and alignment.  The spinal cord is normal in size and appearance. The paraspinal soft tissues are unremarkable.  On axial views there is no spinal stenosis or foraminal narrowing.  Limited views of the aorta, kidneys, liver, lungs and paraspinal muscles are unremarkable.   Unremarkable MRI thoracic spine (without). No spinal stenosis or foraminal narrowing. INTERPRETING PHYSICIAN: Penni Bombard, MD Certified in Neurology, Neurophysiology and Neuroimaging [facility] [address] [place], [place] [address] [phone]  MR CERVICAL SPINE WO CONTRAST  Result Date: 08/04/2022 CLINICAL DATA:  Initial evaluation for neck pain for 1 week. EXAM: MRI CERVICAL SPINE WITHOUT CONTRAST TECHNIQUE: Multiplanar, multisequence MR imaging of the cervical spine was performed. No intravenous contrast was administered. COMPARISON:  Prior CT from 11/27/2021. FINDINGS: Alignment: Straightening of the normal cervical lordosis. No listhesis. Vertebrae: Vertebral body height maintained without acute or chronic fracture. Bone marrow signal intensity within normal limits. No worrisome osseous lesions. No abnormal marrow edema.  Cord: Normal signal and morphology. Posterior Fossa, vertebral arteries, paraspinal tissues: Unremarkable. Disc levels: C2-C3:  Disc desiccation without significant disc bulge. No canal or foraminal stenosis. C3-C4: Disc desiccation without significant disc bulge. No canal or foraminal stenosis. C4-C5: Disc desiccation with minimal annular bulge. No spinal stenosis. Foramina remain patent. C5-C6: Broad-based central disc protrusion flattens and partially effaces the ventral thecal sac. Minimal flattening of the ventral cord without visible cord signal changes. Mild spinal stenosis. Superimposed uncovertebral spurring with resultant mild left C6 foraminal stenosis. Right neural foramen remains patent. C6-C7:  Minimal annular disc bulge.  No canal or foraminal stenosis. C7-T1: Normal interspace. Mild left-sided facet hypertrophy. No canal or foraminal stenosis. Visualized upper thoracic spine demonstrates no significant finding. IMPRESSION: 1. Broad-based central disc protrusion at C5-6 with resultant mild canal and left C6 foraminal stenosis. 2. Additional minimal noncompressive disc bulging at C4-5 and C6-7 without stenosis. Electronically Signed   By: Jeannine Boga M.D.   On: 08/04/2022 06:22   CT Head Wo Contrast  Result Date: 07/21/2022 CLINICAL DATA:  Head trauma, moderate-severe; Neck trauma, dangerous injury mechanism (Age 27-64y) EXAM: CT HEAD WITHOUT CONTRAST CT CERVICAL SPINE WITHOUT CONTRAST TECHNIQUE: Multidetector CT imaging of the head and cervical spine was performed following the standard protocol without intravenous contrast. Multiplanar CT image reconstructions of the cervical spine were also generated. RADIATION DOSE REDUCTION: This exam was performed according to the departmental dose-optimization program which includes automated exposure control, adjustment of the mA and/or kV according to patient size and/or use of iterative reconstruction technique. COMPARISON:  11/27/2021 FINDINGS:  CT HEAD FINDINGS Brain: No evidence of acute infarction, hemorrhage, hydrocephalus, extra-axial collection or mass lesion/mass effect. Vascular: No hyperdense vessel or unexpected calcification. Skull: Normal. Negative for fracture or focal lesion. Sinuses/Orbits: No acute finding. Other: Negative for scalp hematoma. CT CERVICAL SPINE FINDINGS Alignment: Facet joints are aligned without dislocation or traumatic listhesis. Dens and lateral masses are aligned. Straightening of the cervical lordosis. Skull base and vertebrae: No acute fracture. No primary bone lesion or focal pathologic process. Soft tissues and spinal canal: No prevertebral fluid or swelling. No visible canal hematoma. Disc levels: Intervertebral disc heights are preserved. Mild endplate spurring within the mid to lower cervical spine. No advanced facet joint arthropathy. Upper chest: Negative. Other: None. IMPRESSION: 1. No acute intracranial abnormality. 2. No acute cervical spine fracture or subluxation. 3. Straightening of the cervical lordosis, which may be secondary to positioning or muscle spasm. Electronically Signed   By: Davina Poke D.O.   On: 07/21/2022 12:01   CT Cervical Spine Wo Contrast  Result Date: 07/21/2022 CLINICAL DATA:  Head trauma, moderate-severe; Neck trauma, dangerous injury mechanism (Age 76-64y) EXAM: CT HEAD WITHOUT CONTRAST CT CERVICAL SPINE WITHOUT CONTRAST TECHNIQUE: Multidetector CT imaging of the head and cervical spine was performed following the standard protocol without intravenous contrast. Multiplanar CT image reconstructions of the cervical spine were also generated. RADIATION DOSE REDUCTION: This exam was performed according to the departmental dose-optimization program which includes automated exposure control, adjustment of the mA and/or kV according to patient size and/or use of iterative reconstruction technique. COMPARISON:  11/27/2021 FINDINGS: CT HEAD FINDINGS Brain: No evidence of acute  infarction, hemorrhage, hydrocephalus, extra-axial collection or mass lesion/mass effect. Vascular: No hyperdense vessel or unexpected calcification. Skull: Normal. Negative for fracture or focal lesion. Sinuses/Orbits: No acute finding. Other: Negative for scalp hematoma. CT CERVICAL SPINE FINDINGS Alignment: Facet joints are aligned without dislocation or traumatic listhesis. Dens and lateral masses are aligned. Straightening of the cervical lordosis. Skull base and vertebrae: No acute fracture. No primary bone lesion  or focal pathologic process. Soft tissues and spinal canal: No prevertebral fluid or swelling. No visible canal hematoma. Disc levels: Intervertebral disc heights are preserved. Mild endplate spurring within the mid to lower cervical spine. No advanced facet joint arthropathy. Upper chest: Negative. Other: None. IMPRESSION: 1. No acute intracranial abnormality. 2. No acute cervical spine fracture or subluxation. 3. Straightening of the cervical lordosis, which may be secondary to positioning or muscle spasm. Electronically Signed   By: Davina Poke D.O.   On: 07/21/2022 12:01     I, Lynne Leader, personally (independently) visualized and performed the interpretation of MRI lumbar spine and CT scan ABD/pelvis the images attached in this note.    Assessment and Plan: 41 y.o. female with severe bilateral lower extremity pain associated with weakness and bowel bladder incontinence. Her symptoms are concerning for cauda equina syndrome.  However she had a lumbar spine MRI on October 3 that was relatively normal.  Her leg pain and weakness worsened significantly or started around October 13 which is about 10 days after her lumbar spine MRI.  I think it is possible that she is experiencing cauda equina syndrome or something similar to it now and got very unlucky on the timing of an initial MRI lumbar spine. We will go ahead and repeat the lumbar spine MRI now to evaluate for the potential for  significant disc bulging herniation or nerve compression causing her leg pain and weakness that she is experiencing currently.  Differential does include myositis.  We will check inflammatory markers listed below.  Differential also includes limb ischemia.  We will check ABI.  We will treat with prednisone and gabapentin until we can get the above tests completed.  Recheck after lumbar spine MRI. Symptoms are severe and diagnosis is certainly unclear at this time.   PDMP not reviewed this encounter. Orders Placed This Encounter  Procedures   MR Lumbar Spine Wo Contrast    Standing Status:   Future    Standing Expiration Date:   10/18/2023    Order Specific Question:   What is the patient's sedation requirement?    Answer:   No Sedation    Order Specific Question:   Does the patient have a pacemaker or implanted devices?    Answer:   No    Order Specific Question:   Preferred imaging location?    Answer:   Product/process development scientist (table limit-350lbs)   [facility]    Standing Status:   Future    Number of Occurrences:   1    Standing Expiration Date:   04/18/2023   C-reactive protein    Standing Status:   Future    Number of Occurrences:   1    Standing Expiration Date:   04/18/2023   Sedimentation rate    Standing Status:   Future    Number of Occurrences:   1    Standing Expiration Date:   04/18/2023   Meds ordered this encounter  Medications   predniSONE (STERAPRED UNI-PAK 48 TAB) 10 MG (48) TBPK tablet    Sig: Take by mouth daily. 12 day dosepack po    Dispense:  48 tablet    Refill:  0   gabapentin (NEURONTIN) 300 MG capsule    Sig: Take 1 capsule (300 mg total) by mouth 3 (three) times daily as needed.    Dispense:  90 capsule    Refill:  3     Discussed warning signs or symptoms. Please see discharge instructions. Patient expresses  understanding.   The above documentation has been reviewed and is accurate and complete Lynne Leader, M.D.

## 2022-10-16 NOTE — Telephone Encounter (Signed)
This PT spoke to the patient on the phone at 1015am regarding her same-day cancel. She continues to have significant back pain radiating into her groin. Seeing her PCP today. PT encouraging her to reach out to sports medicine MD. Will dc from vestibular rehab at this time as her back is a major limiting factor and needs to be addressed before continuing with vestib rehab. Patient verbalized understanding. Remainder of appointments canceled.

## 2022-10-17 ENCOUNTER — Ambulatory Visit (INDEPENDENT_AMBULATORY_CARE_PROVIDER_SITE_OTHER): Payer: Medicaid Other | Admitting: Family Medicine

## 2022-10-17 VITALS — BP 130/80 | HR 60 | Ht 65.0 in | Wt 198.0 lb

## 2022-10-17 DIAGNOSIS — M79604 Pain in right leg: Secondary | ICD-10-CM | POA: Diagnosis not present

## 2022-10-17 DIAGNOSIS — M791 Myalgia, unspecified site: Secondary | ICD-10-CM | POA: Diagnosis not present

## 2022-10-17 DIAGNOSIS — R29898 Other symptoms and signs involving the musculoskeletal system: Secondary | ICD-10-CM | POA: Diagnosis not present

## 2022-10-17 DIAGNOSIS — M5416 Radiculopathy, lumbar region: Secondary | ICD-10-CM

## 2022-10-17 DIAGNOSIS — M79605 Pain in left leg: Secondary | ICD-10-CM

## 2022-10-17 LAB — SEDIMENTATION RATE: Sed Rate: 38 mm/hr — ABNORMAL HIGH (ref 0–20)

## 2022-10-17 LAB — C-REACTIVE PROTEIN: CRP: <1 mg/dL (ref 0.5–20.0)

## 2022-10-17 LAB — CK: Total CK: 86 U/L (ref 7–177)

## 2022-10-17 MED ORDER — PREDNISONE 10 MG (48) PO TBPK: ORAL_TABLET | Freq: Every day | ORAL | 0 refills | Status: AC

## 2022-10-17 MED ORDER — GABAPENTIN 300 MG PO CAPS: 300.0000 mg | ORAL_CAPSULE | Freq: Three times a day (TID) | ORAL | 3 refills | Status: AC | PRN

## 2022-10-17 NOTE — Patient Instructions (Signed)
Thank you for coming in today.   You should hear from MRI scheduling within 1 week. If you do not hear please let me know.    We will get the vascular ultraosund scheduled.   Take the prednione and gabapentin.   Please get labs today before you leave   Recheck following the tests.   Let me know if you have trouble getting this set up.

## 2022-10-18 NOTE — Progress Notes (Signed)
Labs do not show severe inflammation of the muscles.

## 2022-10-21 ENCOUNTER — Ambulatory Visit: Payer: Medicaid Other | Admitting: Physical Therapy

## 2022-10-21 ENCOUNTER — Other Ambulatory Visit (HOSPITAL_COMMUNITY): Payer: Self-pay | Admitting: Family Medicine

## 2022-10-21 DIAGNOSIS — I739 Peripheral vascular disease, unspecified: Secondary | ICD-10-CM

## 2022-10-23 ENCOUNTER — Ambulatory Visit: Payer: Medicaid Other | Admitting: Physical Therapy

## 2022-10-24 ENCOUNTER — Ambulatory Visit (HOSPITAL_COMMUNITY): Admission: RE | Admit: 2022-10-24 | Payer: Medicaid Other | Source: Ambulatory Visit

## 2022-11-07 ENCOUNTER — Ambulatory Visit
Admission: RE | Admit: 2022-11-07 | Discharge: 2022-11-07 | Disposition: A | Discharge status: Home / Self Care | Payer: Medicaid Other | Source: Ambulatory Visit | Attending: Endocrinology | Admitting: Endocrinology

## 2022-11-07 ENCOUNTER — Other Ambulatory Visit: Payer: Self-pay | Admitting: Obstetrics and Gynecology

## 2022-11-07 ENCOUNTER — Encounter: Payer: Self-pay | Admitting: Obstetrics and Gynecology

## 2022-11-07 ENCOUNTER — Other Ambulatory Visit: Payer: Self-pay | Admitting: Endocrinology

## 2022-11-07 ENCOUNTER — Ambulatory Visit (INDEPENDENT_AMBULATORY_CARE_PROVIDER_SITE_OTHER): Payer: Medicaid Other | Admitting: Obstetrics and Gynecology

## 2022-11-07 VITALS — BP 141/88 | HR 70 | Wt 195.0 lb

## 2022-11-07 DIAGNOSIS — R102 Pelvic and perineal pain unspecified side: Secondary | ICD-10-CM

## 2022-11-07 DIAGNOSIS — Z1231 Encounter for screening mammogram for malignant neoplasm of breast: Secondary | ICD-10-CM

## 2022-11-07 DIAGNOSIS — M6289 Other specified disorders of muscle: Secondary | ICD-10-CM | POA: Diagnosis not present

## 2022-11-07 DIAGNOSIS — Z1331 Encounter for screening for depression: Secondary | ICD-10-CM

## 2022-11-07 DIAGNOSIS — G8929 Other chronic pain: Secondary | ICD-10-CM

## 2022-11-07 NOTE — Patient Instructions (Signed)
Pelvic dysfunction  Correct your constipation

## 2022-11-07 NOTE — Progress Notes (Signed)
GYNECOLOGY VISIT  Patient name: [name] MRN [id number]  Date of birth: Sep 24, 1981 Chief Complaint:   Pain  History:  [name] is a 41 y.o. [id number] being seen today for pelvic pain.   Left sided "ovary"  groin pain. Sharp in  go teo the right, raidates tot eh lefgs,  Going on for 1-2 months and worsened about 1 month ago. Daily pain, not improved with OTC meds. Restarted smoking and then quit on Monday (mother has stage 4 lung cancer). If sitting long enough the pain will start. Nothing that she is aware of that cause the pain to start - no fall, slide, stretch incorrectly. Had been walking with a walker. When leg is outstretched it feels a lot better. No medication has helped to make it better, just makes her feel sleepy. Feels there is a line of pain going down the front of her leg. 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 Able to get up and do housework, attempts to not "overdo" herself due to prior syncopal episodes. Exercises for back and has PT for dizziness. Will do some walking for exercise as well.   Currently sexually active - dull pain, happens every time, hurts more if she is on top, feels like it's hitting a wall. Present prior to hysterectomy. Not continued afterwards. Vaginal dryness present, uses lubrication and doesn't always help.  No pain with voiding or BM. Has baseline constipation; daily BM that is often hard and occasionally loose stools. Cough/sneeze/laugh - SUI present.    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    Past Surgical History:  Procedure Laterality Date   DILITATION & CURRETTAGE/HYSTROSCOPY WITH NOVASURE ABLATION N/A 02/20/2016   Procedure: HYSTEROSCOPY WITH NOVASURE ABLATION;  Surgeon: [name], MD;  Location: [facility];  Service: Gynecology;  Laterality: N/A;   LAPAROSCOPIC VAGINAL HYSTERECTOMY WITH SALPINGECTOMY Left 11/18/2017   Procedure: LAPAROSCOPIC ASSISTED VAGINAL HYSTERECTOMY WITH SALPINGECTOMY;  Surgeon: Emily Filbert, MD;  Location: [facility];  Service: Gynecology;  Laterality: Left;   LAPAROSCOPY  05/30/2012   Procedure: LAPAROSCOPY OPERATIVE;  Surgeon: Osborne Oman, MD;  Location: [facility];  Service: Gynecology;  Laterality: N/A;  Operative laparoscopy right salpingectomy and removal of ectopic pregnancy   LAPAROSCOPY FOR ECTOPIC PREGNANCY     SALPINGECTOMY     TUBAL LIGATION  2005   WISDOM TOOTH EXTRACTION      The following portions of the patient's history were reviewed and updated as appropriate: allergies, current medications, past family history, past medical history, past social history, past surgical history and problem list.   Health Maintenance:   Last pap 09/2017. Results were: NILM w/ HRHPV negative. H/O abnormal pap: no Last mammogram: 2018. Results were: normal.   Review of Systems:  Pertinent items are noted in HPI. Comprehensive review of systems was otherwise negative.   Objective:  Physical Exam BP (!) 141/88   Pulse 70   Wt 195 lb (88.5 kg)   LMP 11/14/2017   BMI 32.45 kg/m    Physical Exam Vitals and nursing note reviewed. Exam conducted with a chaperone present.  Constitutional:      Appearance: Normal appearance.  HENT:     Head: Normocephalic and atraumatic.  Cardiovascular:     Rate and Rhythm: Normal rate and regular rhythm.  Pulmonary:     Effort: Pulmonary effort is normal.     Breath sounds: Normal breath sounds.  Abdominal:     General: There is no distension.     Palpations: Abdomen is  soft.     Hernia: A hernia is present. There is no hernia in the left inguinal area or right inguinal area.     Comments: Carnet sign bilateral lower abdomen  Mild allodynia in RLQ 3 laparoscopic scars - 2 lower lateral, 1 umbilical, no allodynia present   Genitourinary:    General: Normal vulva.     Exam position: Lithotomy position.     Urethra: No prolapse.     Comments: + CST when standing  Normal appearing vulva Normal vulvar sensation bilaterally Tender superficial pelvic floor muscles on Right Tender ischial tuberosities bilaterally  Allodynia at introitus: No  Anal wink present Posterior vaginal wall tender Right levator ani 5/10 spastic Right ischiococcygeous 6/10 Right obturator internus 6/10 Left levator ani 10/10 - pain she usu feels Left ischioccocygeous 10/10 Left obturator internus not rated but tender Anterior vaginal wall tender Nontender cuff    Musculoskeletal:     Comments: SI joint tenderness bilaterally  Straight leg raise negative bilaterally - subsequent lateral thigh pain with raise on left, low back on right   Skin:    General: Skin is warm and dry.  Neurological:     General: No focal deficit present.     Mental Status: She is alert.  Psychiatric:        Mood and Affect: Mood normal.        Behavior: Behavior normal.        Thought Content: Thought content normal.        Judgment: Judgment normal.      Labs and Imaging No results found.     Assessment & Plan:   1. Chronic pelvic pain in female Recently worsened abdomino-pelvic pain in the setting of prior dysmenorrhea, chronic constipation and high tone pelvic floor dysfunction. Discussed importance of increasing water and fiber intake with goal of soft, formed stools. Reviewed that high tonicity of the pelvic floor likely contributing to incontinence. Continued pain medications for addressing global MSK pain. Significant pelvic floor myalgia and will start with pelvic physical therapy.  Encouraged continued breaks from prolonged sitting. Continue with regular PT to address back pain.   2. Pelvic floor dysfunction Referral to pelvic PT. Continued meloxicam and fel - Ambulatory referral to Physical Therapy  3. Screening mammogram, encounter for Ordered, to complete today - MM Digital Screening; Future  4. Positive depression screening [facility] referral placed - Ambulatory referral to [place]  Routine preventative health maintenance measures emphasized.  Follow up in 3 months   Darliss Cheney, MD [facility] [place]

## 2022-11-08 NOTE — BH Specialist Note (Signed)
Integrated Behavioral Health via Telemedicine Visit  11/19/2022 [name] [id number]  Number of [place] Clinician visits: 1- Initial Visit  Session Start time: 7001   Session End time: 7494  Total time in minutes: 55   Referring Provider: Darliss Cheney, MD Patient/Family location: Home [facility] Provider location: [facility] [place] [facility]  All persons participating in visit: Patient [name] and [name]   Types of Service: Individual psychotherapy and Video visit  I connected with [name] and/or [name]'s  n/a  via  Telephone or Geologist, engineering  (Video is Caregility application) and verified that I am speaking with the correct person using two identifiers. Discussed confidentiality: Yes   I discussed the limitations of telemedicine and the availability of in person appointments.  Discussed there is a possibility of technology failure and discussed alternative modes of communication if that failure occurs.  I discussed that engaging in this telemedicine visit, they consent to the provision of behavioral healthcare and the services will be billed under their insurance.  Patient and/or legal guardian expressed understanding and consented to Telemedicine visit: Yes   Presenting Concerns: Patient and/or family reports the following symptoms/concerns: Conflicted feelings regarding marriage of 68 years; pt's goal is to begin choosing her own peace and happiness. 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  Duration of problem: Increasing unhappiness over time; Severity of problem:  moderately severe  Patient and/or Family's Strengths/Protective Factors: Social connections and Sense of purpose  Goals Addressed: Patient will:  Reduce symptoms of: anxiety, depression, and stress   Increase knowledge and/or ability of: stress  reduction   Demonstrate ability to: Increase healthy adjustment to current life circumstances  Progress towards Goals: Ongoing  Interventions: Interventions utilized:  Supportive Reflection Standardized Assessments completed: Not Needed  Patient and/or Family Response: Patient agrees with treatment plan.   Assessment: Patient currently experiencing Adjustment disorder with mixed anxious and depressed mood and Psychosocial stress.   Patient may benefit from psychoeducation and brief therapeutic interventions regarding coping with symptoms of depression, anxiety, life stress .  Plan: Follow up with behavioral health clinician on : Two weeks Behavioral recommendations:  -Continue taking care of mother on the weekends (Consider caregiver support resource on After Visit Summary) -Begin reclaiming personal time daily (at least 30 minutes/day with phone off) -Continue plan to attend upcoming couple counseling at least once Referral(s): [facility] (In Clinic) and [employer]:  caregiver support  I discussed the assessment and treatment plan with the patient and/or parent/guardian. They were provided an opportunity to ask questions and all were answered. They agreed with the plan and demonstrated an understanding of the instructions.   They were advised to call back or seek an in-person evaluation if the symptoms worsen or if the condition fails to improve as anticipated.  Sharon Fair, LCSW     11/07/2022    9:17 AM 11/25/2017    8:44 AM  Depression screen PHQ 2/9  Decreased Interest 1 3  Down, Depressed, Hopeless 1 1  PHQ - 2 Score 2 4  Altered sleeping 2 3  Tired, decreased energy 1 3  Change in appetite 1 3  Feeling bad or failure about yourself  0 0  Trouble concentrating 1 0  Moving slowly or fidgety/restless 1 0  Suicidal thoughts 0 0  PHQ-9 Score 8 13      11/07/2022    9:17 AM 11/25/2017  8:44 AM  GAD 7 : Generalized Anxiety  Score  Nervous, Anxious, on Edge 2 3  Control/stop worrying 3 3  Worry too much - different things 3 3  Trouble relaxing 3 3  Restless 2 0  Easily annoyed or irritable 3 3  Afraid - awful might happen 3 3  Total GAD 7 Score 19 18

## 2022-11-12 ENCOUNTER — Ambulatory Visit: Payer: Medicaid Other | Attending: Obstetrics and Gynecology | Admitting: Physical Therapy

## 2022-11-12 ENCOUNTER — Ambulatory Visit (HOSPITAL_COMMUNITY)
Admission: RE | Admit: 2022-11-12 | Discharge: 2022-11-12 | Disposition: A | Discharge status: Home / Self Care | Payer: Medicaid Other | Source: Ambulatory Visit | Attending: Family Medicine | Admitting: Family Medicine

## 2022-11-12 ENCOUNTER — Other Ambulatory Visit: Payer: Self-pay | Admitting: Obstetrics and Gynecology

## 2022-11-12 ENCOUNTER — Other Ambulatory Visit: Payer: Self-pay

## 2022-11-12 DIAGNOSIS — M791 Myalgia, unspecified site: Secondary | ICD-10-CM | POA: Diagnosis not present

## 2022-11-12 DIAGNOSIS — M79604 Pain in right leg: Secondary | ICD-10-CM | POA: Diagnosis not present

## 2022-11-12 DIAGNOSIS — I739 Peripheral vascular disease, unspecified: Secondary | ICD-10-CM | POA: Insufficient documentation

## 2022-11-12 DIAGNOSIS — R293 Abnormal posture: Secondary | ICD-10-CM | POA: Diagnosis present

## 2022-11-12 DIAGNOSIS — M79605 Pain in left leg: Secondary | ICD-10-CM | POA: Insufficient documentation

## 2022-11-12 DIAGNOSIS — M6289 Other specified disorders of muscle: Secondary | ICD-10-CM | POA: Diagnosis present

## 2022-11-12 DIAGNOSIS — R279 Unspecified lack of coordination: Secondary | ICD-10-CM | POA: Insufficient documentation

## 2022-11-12 DIAGNOSIS — M6281 Muscle weakness (generalized): Secondary | ICD-10-CM | POA: Diagnosis not present

## 2022-11-12 DIAGNOSIS — M62838 Other muscle spasm: Secondary | ICD-10-CM | POA: Insufficient documentation

## 2022-11-12 DIAGNOSIS — R928 Other abnormal and inconclusive findings on diagnostic imaging of breast: Secondary | ICD-10-CM

## 2022-11-12 NOTE — Therapy (Addendum)
OUTPATIENT PHYSICAL THERAPY FEMALE PELVIC EVALUATION   Patient Name: [name] MRN: [id number] DOB:1980-12-30, 41 y.o., female Today's Date: 11/12/2022  END OF SESSION:  PT End of Session - 11/12/22 1458     Visit Number 6   1 PF   Date for PT Re-Evaluation 02/12/23    Authorization Type [employer] Medicaid    PT Start Time 4982    PT Stop Time 1525    PT Time Calculation (min) 38 min    Activity Tolerance Patient limited by pain    Behavior During Therapy WFL for tasks assessed/performed             Past Medical History:  Diagnosis Date   Anxiety    Arthritis    Asthma    meds as needed   Bronchitis    Chronic back pain    Ectopic pregnancy    requiring surgery   Elevated liver enzymes    GERD (gastroesophageal reflux disease)    Gonorrhea    Hypertension    Irritable bowel    Migraine headache    Peripheral vascular disease ([place])    Pneumonia    2017   Preterm labor    Scoliosis 2023   Seizures ([place])    fever induced during childhood   Past Surgical History:  Procedure Laterality Date   DILITATION & CURRETTAGE/HYSTROSCOPY WITH NOVASURE ABLATION N/A 02/20/2016   Procedure: HYSTEROSCOPY WITH NOVASURE ABLATION;  Surgeon: [name], MD;  Location: [facility];  Service: Gynecology;  Laterality: N/A;   LAPAROSCOPIC VAGINAL HYSTERECTOMY WITH SALPINGECTOMY Left 11/18/2017   Procedure: LAPAROSCOPIC ASSISTED VAGINAL HYSTERECTOMY WITH SALPINGECTOMY;  Surgeon: Emily Filbert, MD;  Location: [facility];  Service: Gynecology;  Laterality: Left;   LAPAROSCOPY  05/30/2012   Procedure: LAPAROSCOPY OPERATIVE;  Surgeon: Osborne Oman, MD;  Location: [facility];  Service: Gynecology;  Laterality: N/A;  Operative laparoscopy right salpingectomy and removal of ectopic pregnancy   LAPAROSCOPY FOR ECTOPIC PREGNANCY     SALPINGECTOMY     TUBAL LIGATION  2005   WISDOM TOOTH EXTRACTION     Patient Active Problem List   Diagnosis Date Noted   Incontinence of feces  08/29/2022   Gait abnormality 08/29/2022   Syncope and collapse 03/27/2022   Alteration consciousness 11/23/2021   Chronic migraine w/o aura w/o status migrainosus, not intractable 11/23/2021   Vitamin D deficiency 09/07/2019   Lower abdominal pain 01/22/2019   Post-operative state 11/18/2017   Fibroid uterus 11/02/2015   Abnormal uterine bleeding (AUB) 11/02/2015   Asthma exacerbation 03/23/2015   Tobacco abuse 03/23/2015   History of migraine 03/23/2015   Acute asthma exacerbation    Cough    Acute bronchitis    Asthma 03/22/2015   Migraine headache 03/07/2014    PCP: Beverley Fiedler, FNP  REFERRING PROVIDER: Darliss Cheney, MD  REFERRING DIAG: M62.89 (ICD-10-CM) - Pelvic floor dysfunction  THERAPY DIAG:  Muscle weakness (generalized)  Abnormal posture  Other muscle spasm  Unspecified lack of coordination  Rationale for Evaluation and Treatment: Rehabilitation  ONSET DATE: 09/2022  SUBJECTIVE:  SUBJECTIVE STATEMENT: Pt reports worsening pain in bil anterior hip/groin and limits all activity. Sitting makes it worse. Pt also has pain with sex, constipation, leakage of urine and stool and sometimes feels like she has no urge to empty but will have full loss of urine and sometimes only small amounts of stool but depends on if it is more runny. This is when pain is at its highest and she feels like she can't feel the pelvis. Also has leakage of urine with coughing and sneezing.   Fluid intake: Yes:      PAIN:  Are you having pain? Yes NPRS scale: 10/10 now and worst, on good days has 0/10 pain but with sitting upright starts to feel it Pain location:  bil anterior hip pain/ groin  Pain type: sharp Pain description: intermittent and constant   Aggravating factors:  sitting Relieving factors: laying flat, rest   PRECAUTIONS: Other: concussion June 2023 at work  [place]: No  FALLS:  Has patient fallen in last 6 months? No  LIVING ENVIRONMENT: Lives with: lives with their family Lives in: House/apartment   OCCUPATION: not working currently  PLOF: Independent  PATIENT GOALS: to have less pain  PERTINENT HISTORY:  dysmenorrhea, chronic constipation and high tone pelvic floor dysfunction, hysterectomy, Migraine headache,Syncope and collapse, Scoliosis, Gonorrhea Sexual abuse: No  BOWEL MOVEMENT: Pain with bowel movement: Yes Type of bowel movement:Type (Bristol Stool Scale) 2-6, Frequency 1-2 daily, and Strain Yes Fully empty rectum: No Leakage: Yes: with high pain levels with have stool leakage and have no urge or awareness of leakage.  Pads: Yes: sometimes Fiber supplement: No  URINATION: Pain with urination: No Fully empty bladder: Yes: but not always Stream: Strong Urgency: Yes:   Frequency: not quicker than every 2 hours Leakage: Urge to void, Coughing, Sneezing, and Laughing Pads: No  INTERCOURSE: Pain with intercourse: Initial Penetration, During Penetration, and During Climax Ability to have vaginal penetration:  Yes:   Climax: painful and reports, burning and "zingers" at Lt leg Marinoff Scale: 3/3  PREGNANCY: Vaginal deliveries 3 Tearing Yes: tearing with first C-section deliveries 0 Currently pregnant No  PROLAPSE: "I feel like something about to fall out".    OBJECTIVE:   DIAGNOSTIC FINDINGS:    PATIENT SURVEYS:    PFIQ-7 90  COGNITION: Overall cognitive status: Within functional limits for tasks assessed     SENSATION: Light touch: Deficits fingertips per pt Proprioception: Deficits    MUSCLE LENGTH: Bil hamstrings and adductors limited by 25%    POSTURE: rounded shoulders, forward head, and posterior pelvic tilt    LUMBARAROM/PROM:  A/PROM A/PROM  eval  Flexion  Limited by 50%  Extension WFL  Right lateral flexion Limited by 25%  Left lateral flexion Limited by 25%  Right rotation Limited by 25%  Left rotation Limited by 25%   (Blank rows = not tested)  LOWER EXTREMITY ROM:  WFL  LOWER EXTREMITY MMT:  Bil hips 3+/5; knees 4+/5, ankles 5/5  PALPATION:   General  TTP throughout abdomen but worse in lower quadrants, fascial restrictions throughout abdomen                External Perineal Exam TTP at bil bulbocavernosus                             Internal Pelvic Floor TTP throughout inl pelvic floor superficial and deep layers, trigger points at bil obturator internus  Patient confirms  identification and approves PT to assess internal pelvic floor and treatment Yes  PELVIC MMT:   MMT eval  Vaginal 5/5  Internal Anal Sphincter   External Anal Sphincter   Puborectalis   Diastasis Recti   (Blank rows = not tested)        TONE: Increased  PROLAPSE: Anterior wall laxity possible grade 1 with strong cough in hooklying   TODAY'S TREATMENT:                                                                                                                              DATE: 11/12/22   EVAL Examination completed, findings reviewed, pt educated on POC, HEP. Pt motivated to participate in PT and agreeable to attempt recommendations.    If treatment provided at initial evaluation, no treatment charged due to lack of authorization.         PATIENT EDUCATION:  Education details: [id number] Person educated: Patient Education method: Explanation, Demonstration, Tactile cues, Verbal cues, and Handouts Education comprehension: verbalized understanding and returned demonstration  HOME EXERCISE PROGRAM: [id number]  ASSESSMENT:  CLINICAL IMPRESSION: Patient is a 41 y.o. female  who was seen today for physical therapy evaluation and treatment for pelvic floor dysfunction with noted pain with intercourse, anterior bil hip/ groin pain, pain with  sitting, urine and bowel leakage. Pt found to have decreased spine and hip flexibility, decreased hip and core strength, fascial restrictions at abdomen with TTP in lower quadrants. Pt consented to internal vaginal assessment this date and found to have ttp throughout pelvic floor and multiple trigger points. Pt would benefit from additional PT to further address deficits.    OBJECTIVE IMPAIRMENTS: decreased coordination, decreased endurance, decreased mobility, decreased strength, increased fascial restrictions, increased muscle spasms, impaired flexibility, improper body mechanics, postural dysfunction, and pain.   ACTIVITY LIMITATIONS: carrying, lifting, sitting, standing, squatting, stairs, continence, and locomotion level  PARTICIPATION LIMITATIONS: cleaning, laundry, interpersonal relationship, community activity, occupation, and yard work  PERSONAL FACTORS: Fitness, Past/current experiences, Time since onset of injury/illness/exacerbation, and 1 comorbidity: medical history  are also affecting patient's functional outcome.   REHAB POTENTIAL: Good  CLINICAL DECISION MAKING: Stable/uncomplicated  EVALUATION COMPLEXITY: Low   GOALS: Goals reviewed with patient? Yes  SHORT TERM GOALS: Target date: 12/10/22  Pt to be I with HEP.  Baseline: Goal status: INITIAL  2.  Pt will report no more than 6/10 pain at groin/pelvic area due to improvements in posture, strength, and muscle length  Baseline:  Goal status: INITIAL  3.  Pt will have 25% less urgency due to bladder retraining and strengthening  Baseline:  Goal status: INITIAL  4.  Pt to demonstrate improved coordination with breathing mechanics and pelvic floor relaxation for decreased pain.  Baseline:  Goal status: INITIAL   LONG TERM GOALS: Target date: 02/12/22  Pt to be I with advanced HEP.  Baseline:  Goal status: INITIAL  2.  Pt will report no more than 3/10 pain  at groin/pelvic area due to improvements in posture,  strength, and muscle length  Baseline:  Goal status: INITIAL  3.   Pt will have 50% less urgency due to bladder retraining and strengthening  Baseline:  Goal status: INITIAL  4.  Pt to report tolerance to vaginal dilator size 6 or equivalent to improve tolerance to vaginal penetration with less pain for medical assessments.   Baseline:  Goal status: INITIAL  5.  Pt to report no more than one instance of stool and urine leakage in one month for improved QOL.  Baseline:  Goal status: INITIAL   PLAN:  PT FREQUENCY: 1x/week  PT DURATION:  10 sessions  PLANNED INTERVENTIONS: Therapeutic exercises, Therapeutic activity, Neuromuscular re-education, Patient/Family education, Self Care, Joint mobilization, Dry Needling, Electrical stimulation, Spinal mobilization, Cryotherapy, Moist heat, scar mobilization, Taping, Vasopneumatic device, Biofeedback, and Manual therapy  PLAN FOR NEXT SESSION: internal if needed and pt consents, manual at abdomen/spine/hips, pelvic floor relaxation techniques, stretching spine/hips  Stacy [facility], PT, DPT 11/21/233:45 PM   PHYSICAL THERAPY DISCHARGE SUMMARY  Visits from Start of Care: 1  Current functional level related to goals / functional outcomes: Unable to formally reassess as pt unable to return since eval for appointments and being discharged due to clinic's attendance policy.    Remaining deficits: Unable to formally reassess    Education / Equipment: HEP   Patient agrees to discharge. Patient goals were not met. Patient is being discharged due to not returning since the last visit.  Stacy [facility], PT, DPT 12/28/232:03 PM

## 2022-11-13 ENCOUNTER — Ambulatory Visit: Payer: Medicaid Other

## 2022-11-13 NOTE — Progress Notes (Signed)
Artery blood flow into the legs are normal.  No sign of restricted blood flow.

## 2022-11-19 ENCOUNTER — Ambulatory Visit (INDEPENDENT_AMBULATORY_CARE_PROVIDER_SITE_OTHER): Payer: Medicaid Other | Admitting: Clinical

## 2022-11-19 DIAGNOSIS — F4323 Adjustment disorder with mixed anxiety and depressed mood: Secondary | ICD-10-CM | POA: Diagnosis not present

## 2022-11-19 DIAGNOSIS — Z658 Other specified problems related to psychosocial circumstances: Secondary | ICD-10-CM

## 2022-11-19 NOTE — Patient Instructions (Signed)
[facility] [address] [place], [place] [address] [phone] (main office) [phone] (Tajae Maiolo's office)  [facility] (Caregiver Support) [email or web address]  

## 2022-11-21 ENCOUNTER — Ambulatory Visit: Payer: Medicaid Other | Admitting: Physical Therapy

## 2022-11-27 ENCOUNTER — Encounter: Payer: Self-pay | Admitting: Neurology

## 2022-11-27 ENCOUNTER — Ambulatory Visit
Admission: RE | Admit: 2022-11-27 | Discharge: 2022-11-27 | Disposition: A | Discharge status: Home / Self Care | Payer: Medicaid Other | Source: Ambulatory Visit | Attending: Obstetrics and Gynecology | Admitting: Obstetrics and Gynecology

## 2022-11-27 DIAGNOSIS — R928 Other abnormal and inconclusive findings on diagnostic imaging of breast: Secondary | ICD-10-CM

## 2022-12-05 ENCOUNTER — Ambulatory Visit: Payer: Medicaid Other | Admitting: Clinical

## 2022-12-05 DIAGNOSIS — Z658 Other specified problems related to psychosocial circumstances: Secondary | ICD-10-CM

## 2022-12-05 DIAGNOSIS — F4323 Adjustment disorder with mixed anxiety and depressed mood: Secondary | ICD-10-CM

## 2022-12-05 NOTE — BH Specialist Note (Signed)
Integrated Behavioral Health via Telemedicine Visit  12/05/2022 HAILI DONOFRIO [id number]  Number of [place] Clinician visits: 1- Initial Visit  Session Start time: 3437   Session End time: 3578  Total time in minutes: 55   Referring Provider: Darliss Cheney, MD Patient/Family location: Home [facility] Provider location: [facility]  All persons participating in visit: Patient Skylar Flynt and [place]   Types of Service: Individual psychotherapy and Video visit  I connected with [name] and/or [name]'s  n/a  via  Telephone or Geologist, engineering  (Video is Caregility application) and verified that I am speaking with the correct person using two identifiers. Discussed confidentiality: Yes   I discussed the limitations of telemedicine and the availability of in person appointments.  Discussed there is a possibility of technology failure and discussed alternative modes of communication if that failure occurs.  I discussed that engaging in this telemedicine visit, they consent to the provision of behavioral healthcare and the services will be billed under their insurance.  Patient and/or legal guardian expressed understanding and consented to Telemedicine visit: Yes   Very brief visit due to technical issues; made several attempts to call back, but unable to reach; voicemail is full.   Caroleen Hamman Jentry Mcqueary, LCSW

## 2022-12-06 ENCOUNTER — Ambulatory Visit: Payer: Medicaid Other | Admitting: Physical Therapy

## 2022-12-09 ENCOUNTER — Telehealth: Payer: Self-pay | Admitting: Neurology

## 2022-12-09 NOTE — Telephone Encounter (Signed)
VM box full, sent mychart msg and texted advising pt of r/s needed for 12/19 appt- Megan out.

## 2022-12-10 ENCOUNTER — Ambulatory Visit: Payer: Medicaid Other | Attending: Obstetrics and Gynecology | Admitting: Physical Therapy

## 2022-12-10 ENCOUNTER — Ambulatory Visit: Payer: Medicaid Other | Admitting: Adult Health

## 2022-12-10 DIAGNOSIS — M6289 Other specified disorders of muscle: Secondary | ICD-10-CM | POA: Insufficient documentation

## 2022-12-10 DIAGNOSIS — R279 Unspecified lack of coordination: Secondary | ICD-10-CM | POA: Insufficient documentation

## 2022-12-10 DIAGNOSIS — M6281 Muscle weakness (generalized): Secondary | ICD-10-CM | POA: Insufficient documentation

## 2022-12-10 DIAGNOSIS — M62838 Other muscle spasm: Secondary | ICD-10-CM | POA: Insufficient documentation

## 2022-12-10 DIAGNOSIS — R293 Abnormal posture: Secondary | ICD-10-CM | POA: Insufficient documentation

## 2022-12-19 ENCOUNTER — Ambulatory Visit: Payer: Medicaid Other | Admitting: Physical Therapy

## 2022-12-19 ENCOUNTER — Telehealth: Payer: Self-pay | Admitting: Physical Therapy

## 2022-12-19 NOTE — Telephone Encounter (Signed)
PT called pt about this morning's appointment at 1230. Unfortunately this is pt's third no show for appointments and due to clinic's attendance policy, pt will need to be discharged from PT. Pt verbalized understanding and made aware she would need a new referral for future PT needs. Pt reports she will call back in the future if she has additional needs for PT.  Thank you,  Stacy [facility], PT, DPT 12/28/232:01 PM

## 2022-12-26 ENCOUNTER — Encounter: Payer: Medicaid Other | Admitting: Physical Therapy

## 2023-01-17 ENCOUNTER — Telehealth: Payer: Self-pay | Admitting: Family Medicine

## 2023-01-17 NOTE — Telephone Encounter (Signed)
Pt would like a letter from Dr. Georgina Snell advising that it is OK for her drive. She said she is no longer having dizziness as her cardiologist has reduced her meds and she feels back to normal.  Pt was OK if visit needed. Needs note by 1/31.

## 2023-01-20 ENCOUNTER — Encounter: Payer: Self-pay | Admitting: Family Medicine

## 2023-01-20 ENCOUNTER — Telehealth: Payer: Self-pay | Admitting: Neurology

## 2023-01-20 NOTE — Telephone Encounter (Signed)
Pt states she has no had any episodes since Sept-Oct, her primary Doctor has taken her off all her medications.  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  Please call pt to discuss.

## 2023-01-20 NOTE — Telephone Encounter (Signed)
I called and left a message asking for more information. I dont think I ever restricted driving.

## 2023-01-20 NOTE — Telephone Encounter (Signed)
[name] called back. She had syncope type symptoms in or around March 2023. She had a neg event monitor and feels normally now >6 months.  Letter for DOT eval written today.

## 2023-01-20 NOTE — Telephone Encounter (Signed)
Commercial drive license, recurrent passing out episode, last visit Sep 2023.  Only medication from our office is for migraine.  She needs to be evaluated again for any concerns.  Ok to be seen by NP

## 2023-02-05 ENCOUNTER — Ambulatory Visit: Payer: Medicaid Other | Admitting: Internal Medicine

## 2023-02-05 NOTE — Progress Notes (Deleted)
Name: [name]  MRN/ DOB: [id number], 10-20-1981    Age/ Sex: 42 y.o., female    PCP: Beverley Fiedler, FNP   Reason for Endocrinology Evaluation: Hyperthyroidism     Date of Initial Endocrinology Evaluation: 03/22/2022    HPI: [name] is a 42 y.o. female with a past medical history of Migraine headaches and asthma . The patient presented for initial endocrinology clinic visit on 03/22/2022 for consultative assistance with her Hyperthyroidism.   Pt has been not with slightly low TSh at 0.333uIU/mL during routine labs 02/2022  TRAb undetectable Thyroid ultrasound was ordered in March 2023 but by her visit October 2020.  This was not done, we did not pursue this anymore due to normalization of her TFTs  She is S/P hysterectomy   Cousin with thyroid disease    SUBJECTIVE:    Today (02/05/23):  [name] is here for follow-up on hyperthyroid  Weight continues to fluctuate  She continues  palpitations , had an echo  She continues with irregular bowel movements No biotin use  Denies local neck swelling but has occasional tender lymph nodes Anxiety has been stable    HISTORY:  Past Medical History:  Past Medical History:  Diagnosis Date   Anxiety    Arthritis    Asthma    meds as needed   Bronchitis    Chronic back pain    Ectopic pregnancy    requiring surgery   Elevated liver enzymes    GERD (gastroesophageal reflux disease)    Gonorrhea    Hypertension    Irritable bowel    Migraine headache    Peripheral vascular disease ([place])    Pneumonia    2017   Preterm labor    Scoliosis 2023   Seizures ([place])    fever induced during childhood   Past Surgical History:  Past Surgical History:  Procedure Laterality Date   DILITATION & CURRETTAGE/HYSTROSCOPY WITH NOVASURE ABLATION N/A 02/20/2016   Procedure: HYSTEROSCOPY WITH NOVASURE ABLATION;  Surgeon: [name], MD;  Location: [facility];  Service:  Gynecology;  Laterality: N/A;   LAPAROSCOPIC VAGINAL HYSTERECTOMY WITH SALPINGECTOMY Left 11/18/2017   Procedure: LAPAROSCOPIC ASSISTED VAGINAL HYSTERECTOMY WITH SALPINGECTOMY;  Surgeon: Emily Filbert, MD;  Location: [facility];  Service: Gynecology;  Laterality: Left;   LAPAROSCOPY  05/30/2012   Procedure: LAPAROSCOPY OPERATIVE;  Surgeon: Osborne Oman, MD;  Location: [facility];  Service: Gynecology;  Laterality: N/A;  Operative laparoscopy right salpingectomy and removal of ectopic pregnancy   LAPAROSCOPY FOR ECTOPIC PREGNANCY     SALPINGECTOMY     TUBAL LIGATION  2005   WISDOM TOOTH EXTRACTION      Social History:  reports that she quit smoking about 3 months ago. Her smoking use included cigarettes. She has a 0.10 pack-year smoking history. She has never used smokeless tobacco. She reports that she does not currently use alcohol after a past usage of about 1.0 standard drink of alcohol per week. She reports that she does not use drugs. Family History: family history includes Asthma in her brother; Breast cancer in her cousin; Prostate cancer in her father.   HOME MEDICATIONS: Allergies as of 02/05/2023       Reactions   Shellfish Allergy Hives, Shortness Of Breath   Latex Hives   Raspberry Hives, Itching, Swelling   Vioxx [rofecoxib] Hives        Medication List        Accurate as of February 05, 2023 11:50 AM. If you have any questions, ask your nurse or doctor.          acetaminophen 650 MG CR tablet Commonly known as: TYLENOL Take 1,300 mg by mouth every 8 (eight) hours as needed for pain.   albuterol 108 (90 Base) MCG/ACT inhaler Commonly known as: VENTOLIN HFA Inhale 1-2 puffs into the lungs every 6 (six) hours as needed for wheezing or shortness of breath.   cyclobenzaprine 10 MG tablet Commonly known as: FLEXERIL Take 10 mg by mouth 3 (three) times daily as needed.   gabapentin 300 MG capsule Commonly known as: NEURONTIN Take 1 capsule (300 mg total) by mouth 3  (three) times daily as needed.   meclizine 25 MG tablet Commonly known as: ANTIVERT Take 25 mg by mouth daily as needed.   meloxicam 7.5 MG tablet Commonly known as: MOBIC Take 7.5 mg by mouth daily.   ondansetron 4 MG disintegrating tablet Commonly known as: ZOFRAN-ODT Take 1 tablet (4 mg total) by mouth every 8 (eight) hours as needed for nausea or vomiting.   predniSONE 10 MG (48) Tbpk tablet Commonly known as: STERAPRED UNI-PAK 48 TAB Take by mouth daily. 12 day dosepack po   rizatriptan 10 MG disintegrating tablet Commonly known as: Maxalt-MLT Take 1 tablet (10 mg total) by mouth as needed. May repeat in 2 hours if needed   topiramate 100 MG tablet Commonly known as: TOPAMAX One in the morning, 2 at night   triamterene-hydrochlorothiazide 37.5-25 MG tablet Commonly known as: MAXZIDE-25 Take 1 tablet by mouth daily.   Vitamin D (Ergocalciferol) 1.25 MG (50000 UNIT) Caps capsule Commonly known as: DRISDOL Take 1 capsule (50,000 Units total) by mouth once a week.          REVIEW OF SYSTEMS: A comprehensive ROS was conducted with the patient and is negative except as per HPI and below:  ROS     OBJECTIVE:  VS: LMP 11/14/2017    Wt Readings from Last 3 Encounters:  11/07/22 195 lb (88.5 kg)  10/17/22 198 lb (89.8 kg)  09/27/22 200 lb 9.6 oz (91 kg)     EXAM: General: Pt appears well and is in NAD  Eyes: External eye exam normal without stare, lid lag or exophthalmos.  EOM intact.   Neck: General: Supple without adenopathy. Thyroid: Thyroid size normal.  No goiter or nodules appreciated.   Lungs: Clear with good BS bilat with no rales, rhonchi, or wheezes  Heart: Auscultation: RRR.  Abdomen: Normoactive bowel sounds, soft, nontender, without masses or organomegaly palpable  Extremities:  BL LE: No pretibial edema normal ROM and strength.  Mental Status: Judgment, insight: Intact Orientation: Oriented to time, place, and person Mood and affect: No  depression, anxiety, or agitation     DATA REVIEWED:  Latest Reference Range & Units 09/27/22 13:33  Calcium 8.7 - 10.2 mg/dL 8.7  Alkaline Phosphatase 44 - 121 IU/L 143 (H)  BONE FRACTION  WILL FOLLOW (P)  INTESTINAL FRAC.  WILL FOLLOW (P)  LIVER FRACTION  WILL FOLLOW (P)  VITD 30.00 - 100.00 ng/mL 16.03 (L)    Latest Reference Range & Units 09/27/22 13:33  PTH, Intact 15 - 65 pg/mL 34  PTH Interp  Comment  TSH 0.35 - 5.50 uIU/mL 0.49  Triiodothyronine (T3) 71 - 180 ng/dL 125  T4,Free(Direct) 0.60 - 1.60 ng/dL 0.88     ASSESSMENT/PLAN/RECOMMENDATIONS:   Subclinical Hyperthyroidism   -Patient  with palpitations and anxiety, we initially opted to treat given  these symptoms but her TFTs have come back normal and there is no indication to start thionamides therapy -No local neck symptoms -Discussed differential diagnosis of Graves' disease versus autonomous multinodular goiter    2. Vitamin D Deficiency :  -We will replenish with ergocalciferol 50,000 IU weekly -Patient will be advised after completing prescription to go to over-the-counter vitamin D3 2000 IU daily   3. Elevated Alkaline phosphatase:   - Elevated Alkaline phosphatase could be of hepatic, intestinal or bone origin. - Elevated Alkaline phosphatase of bone origin is due to increase osteoblastic activity.  - Causes include : hyperthyroidism, hyperparathyroidism, osteomalacia , recent fractures, paget's disease or familial causes.  -Awaiting on alkaline phosphatase assess times   Follow-up 4 months    Signed electronically by: Mack Guise, MD  [facility]  [facility] [address] [place], [place] [address] Phone: [phone] FAX: [phone]   CC: Beverley Fiedler, [place] [address] Phone: [phone] Fax: [phone]   Return to Endocrinology clinic as below: Future Appointments  Date Time Provider [place]  02/05/2023  2:40 PM Christal Lagerstrom, Melanie Crazier, MD [id number] None

## 2023-05-25 ENCOUNTER — Other Ambulatory Visit: Payer: Self-pay | Admitting: Neurology

## 2024-03-12 ENCOUNTER — Encounter (HOSPITAL_COMMUNITY): Payer: Self-pay

## 2024-03-12 ENCOUNTER — Emergency Department (HOSPITAL_COMMUNITY)
Admission: EM | Admit: 2024-03-12 | Discharge: 2024-03-12 | Disposition: A | Discharge status: Home / Self Care | Attending: Emergency Medicine | Admitting: Emergency Medicine

## 2024-03-12 ENCOUNTER — Emergency Department (HOSPITAL_COMMUNITY)

## 2024-03-12 ENCOUNTER — Other Ambulatory Visit: Payer: Self-pay

## 2024-03-12 DIAGNOSIS — Z91040 Latex allergy status: Secondary | ICD-10-CM | POA: Insufficient documentation

## 2024-03-12 DIAGNOSIS — K900 Celiac disease: Secondary | ICD-10-CM | POA: Diagnosis not present

## 2024-03-12 DIAGNOSIS — Z79899 Other long term (current) drug therapy: Secondary | ICD-10-CM | POA: Diagnosis not present

## 2024-03-12 DIAGNOSIS — R1031 Right lower quadrant pain: Secondary | ICD-10-CM

## 2024-03-12 DIAGNOSIS — I10 Essential (primary) hypertension: Secondary | ICD-10-CM | POA: Insufficient documentation

## 2024-03-12 LAB — URINALYSIS, ROUTINE W REFLEX MICROSCOPIC
Bilirubin Urine: NEGATIVE
Glucose, UA: NEGATIVE mg/dL
Hgb urine dipstick: NEGATIVE
Ketones, ur: NEGATIVE mg/dL
Leukocytes,Ua: NEGATIVE
Nitrite: NEGATIVE
Protein, ur: NEGATIVE mg/dL
Specific Gravity, Urine: 1.018 (ref 1.005–1.030)
pH: 6 (ref 5.0–8.0)

## 2024-03-12 LAB — CBC
HCT: 41.4 % (ref 36.0–46.0)
Hemoglobin: 14 g/dL (ref 12.0–15.0)
MCH: 30.1 pg (ref 26.0–34.0)
MCHC: 33.8 g/dL (ref 30.0–36.0)
MCV: 89 fL (ref 80.0–100.0)
Platelets: 271 10*3/uL (ref 150–400)
RBC: 4.65 MIL/uL (ref 3.87–5.11)
RDW: 12.4 % (ref 11.5–15.5)
WBC: 10.1 10*3/uL (ref 4.0–10.5)
nRBC: 0 % (ref 0.0–0.2)

## 2024-03-12 LAB — COMPREHENSIVE METABOLIC PANEL
ALT: 19 U/L (ref 0–44)
AST: 22 U/L (ref 15–41)
Albumin: 3.8 g/dL (ref 3.5–5.0)
Alkaline Phosphatase: 103 U/L (ref 38–126)
Anion gap: 10 (ref 5–15)
BUN: 11 mg/dL (ref 6–20)
CO2: 23 mmol/L (ref 22–32)
Calcium: 9.3 mg/dL (ref 8.9–10.3)
Chloride: 102 mmol/L (ref 98–111)
Creatinine, Ser: 0.75 mg/dL (ref 0.44–1.00)
GFR, Estimated: >60 mL/min (ref >60)
Glucose, Bld: 161 mg/dL — ABNORMAL HIGH (ref 70–99)
Potassium: 3.1 mmol/L — ABNORMAL LOW (ref 3.5–5.1)
Sodium: 135 mmol/L (ref 135–145)
Total Bilirubin: 0.6 mg/dL (ref 0.0–1.2)
Total Protein: 7.4 g/dL (ref 6.5–8.1)

## 2024-03-12 LAB — LIPASE, BLOOD: Lipase: 40 U/L (ref 11–51)

## 2024-03-12 MED ORDER — ONDANSETRON HCL 4 MG/2ML IJ SOLN
4.0000 mg | Freq: Once | INTRAMUSCULAR | Status: AC
  Administered 2024-03-12: 4 mg via INTRAVENOUS
  Filled 2024-03-12: qty 2

## 2024-03-12 MED ORDER — OXYCODONE HCL 5 MG PO TABS: 5.0000 mg | ORAL_TABLET | Freq: Four times a day (QID) | ORAL | 0 refills | Status: AC | PRN

## 2024-03-12 MED ORDER — ONDANSETRON HCL 4 MG PO TABS: 4.0000 mg | ORAL_TABLET | Freq: Four times a day (QID) | ORAL | 0 refills | Status: DC

## 2024-03-12 MED ORDER — POTASSIUM CHLORIDE CRYS ER 20 MEQ PO TBCR
40.0000 meq | EXTENDED_RELEASE_TABLET | Freq: Once | ORAL | Status: AC
  Administered 2024-03-12: 40 meq via ORAL
  Filled 2024-03-12: qty 2

## 2024-03-12 MED ORDER — IOHEXOL 300 MG/ML  SOLN
100.0000 mL | Freq: Once | INTRAMUSCULAR | Status: AC | PRN
  Administered 2024-03-12: 100 mL via INTRAVENOUS

## 2024-03-12 MED ORDER — MORPHINE SULFATE (PF) 4 MG/ML IV SOLN
4.0000 mg | Freq: Once | INTRAVENOUS | Status: AC
  Administered 2024-03-12: 4 mg via INTRAVENOUS
  Filled 2024-03-12: qty 1

## 2024-03-12 MED ORDER — ONDANSETRON HCL 4 MG PO TABS: 4.0000 mg | ORAL_TABLET | Freq: Four times a day (QID) | ORAL | 0 refills | Status: AC

## 2024-03-12 MED ORDER — OXYCODONE HCL 5 MG PO TABS: 5.0000 mg | ORAL_TABLET | Freq: Four times a day (QID) | ORAL | 0 refills | Status: DC | PRN

## 2024-03-12 MED ORDER — KETOROLAC TROMETHAMINE 15 MG/ML IJ SOLN
15.0000 mg | Freq: Once | INTRAMUSCULAR | Status: AC
  Administered 2024-03-12: 15 mg via INTRAVENOUS
  Filled 2024-03-12: qty 1

## 2024-03-12 NOTE — ED Provider Notes (Signed)
 [place] [facility] DEPART[facility] AT [facility] Provider Note   CSN: [id number] Arrival date & time: 03/12/24  1533     History  Chief Complaint  Patient presents with   Abdominal Pain    [name] is a 43 y.o. female.  Patient is a 43 year old female with past medical history of hypertension and celiac presenting to the emergency department with abdominal pain.  The patient states that she has had some chronic periumbilical abdominal pain and was recently diagnosed with celiac's disease.  She states that since last night her pain significantly worsened and has been more in the right lower quadrant compared to her normal periumbilical pain.  She states that she has been nauseous and vomited twice this morning.  She states that she has had hot and cold sweats but has not measured a fever at home.  Denies any diarrhea or constipation.  She was seen by her primary doctor this morning who was concerned for appendicitis and recommended that she be evaluated in the emergency department.  The history is provided by the patient and a relative.  Abdominal Pain      Home Medications Prior to Admission medications   Medication Sig Start Date End Date Taking? Authorizing Provider  ondansetron (ZOFRAN) 4 MG tablet Take 1 tablet (4 mg total) by mouth every 6 (six) hours. 03/12/24  Yes Theresia Lo, Benetta Spar K, DO  oxyCODONE (ROXICODONE) 5 MG immediate release tablet Take 1 tablet (5 mg total) by mouth every 6 (six) hours as needed for severe pain (pain score 7-10). 03/12/24  Yes Elayne Snare K, DO  acetaminophen (TYLENOL) 650 MG CR tablet Take 1,300 mg by mouth every 8 (eight) hours as needed for pain.    [provider]  albuterol (VENTOLIN HFA) 108 (90 Base) MCG/ACT inhaler Inhale 1-2 puffs into the lungs every 6 (six) hours as needed for wheezing or shortness of breath. Patient not taking: Reported on 11/07/2022 11/09/19   Belinda Fisher, PA-C  cyclobenzaprine  (FLEXERIL) 10 MG tablet Take 10 mg by mouth 3 (three) times daily as needed. 10/16/22   [provider]  gabapentin (NEURONTIN) 300 MG capsule Take 1 capsule (300 mg total) by mouth 3 (three) times daily as needed. 10/17/22   Rodolph Bong, MD  meclizine (ANTIVERT) 25 MG tablet Take 25 mg by mouth daily as needed. 11/09/21   [provider]  meloxicam (MOBIC) 7.5 MG tablet Take 7.5 mg by mouth daily. 10/16/22   [provider]  ondansetron (ZOFRAN-ODT) 4 MG disintegrating tablet Take 1 tablet (4 mg total) by mouth every 8 (eight) hours as needed for nausea or vomiting. 11/23/21   Levert Feinstein, MD  predniSONE (STERAPRED UNI-PAK 48 TAB) 10 MG (48) TBPK tablet Take by mouth daily. 12 day dosepack po Patient not taking: Reported on 11/07/2022 10/17/22   Rodolph Bong, MD  rizatriptan (MAXALT-MLT) 10 MG disintegrating tablet Take 1 tablet (10 mg total) by mouth as needed. May repeat in 2 hours if needed 11/23/21   Levert Feinstein, MD  topiramate (TOPAMAX) 100 MG tablet TAKE 1 TABLET BY MOUTH EVERY MORNING AND TAKE 2 TABLETS BY MOUTH AT NIGHT 05/26/23   Levert Feinstein, MD  triamterene-hydrochlorothiazide (MAXZIDE-25) 37.5-25 MG tablet Take 1 tablet by mouth daily. Patient not taking: Reported on 11/07/2022 11/09/21   [provider]  Vitamin D, Ergocalciferol, (DRISDOL) 1.25 MG (50000 UNIT) CAPS capsule Take 1 capsule (50,000 Units total) by mouth once a week. 09/30/22   Shamleffer,  Konrad Dolores, MD      Allergies    Shellfish allergy, Latex, Raspberry, and Vioxx [rofecoxib]    Review of Systems   Review of Systems  Gastrointestinal:  Positive for abdominal pain.    Physical Exam Updated Vital Signs BP 118/86 (BP Location: Right Arm)   Pulse 63   Temp 97.9 F (36.6 C) (Oral)   Resp 18   Ht 5\' 5"  (1.651 m)   Wt 89.4 kg   LMP 11/14/2017   SpO2 100%   BMI 32.78 kg/m  Physical Exam Vitals and nursing note reviewed.  Constitutional:      General: She is not in acute  distress.    Appearance: She is well-developed.  HENT:     Head: Normocephalic and atraumatic.     Mouth/Throat:     Mouth: Mucous membranes are moist.  Eyes:     Extraocular Movements: Extraocular movements intact.  Cardiovascular:     Rate and Rhythm: Normal rate and regular rhythm.     Heart sounds: Normal heart sounds.  Pulmonary:     Effort: Pulmonary effort is normal.     Breath sounds: Normal breath sounds.  Abdominal:     General: Abdomen is flat.     Palpations: Abdomen is soft.     Tenderness: There is abdominal tenderness in the right lower quadrant. There is no guarding or rebound.  Skin:    General: Skin is warm and dry.  Neurological:     General: No focal deficit present.     Mental Status: She is alert and oriented to person, place, and time.  Psychiatric:        Mood and Affect: Mood normal.     ED Results / Procedures / Treatments   Labs (all labs ordered are listed, but only abnormal results are displayed) Labs Reviewed  COMPREHENSIVE METABOLIC PANEL - Abnormal; Notable for the following components:      Result Value   Potassium 3.1 (*)    Glucose, Bld 161 (*)    All other components within normal limits  LIPASE, BLOOD  CBC  URINALYSIS, ROUTINE W REFLEX MICROSCOPIC    EKG None  Radiology CT ABDOMEN PELVIS W CONTRAST Result Date: 03/12/2024 CLINICAL DATA:  Right lower quadrant abdominal pain. EXAM: CT ABDOMEN AND PELVIS WITH CONTRAST TECHNIQUE: Multidetector CT imaging of the abdomen and pelvis was performed using the standard protocol following bolus administration of intravenous contrast. RADIATION DOSE REDUCTION: This exam was performed according to the departmental dose-optimization program which includes automated exposure control, adjustment of the mA and/or kV according to patient size and/or use of iterative reconstruction technique. CONTRAST:  OMNIPAQUE IOHEXOL 300 MG/ML  SOLN COMPARISON:  10/04/2022. FINDINGS: Lower chest: No acute  abnormality. Hepatobiliary: No focal liver abnormality is seen. No gallstones, gallbladder wall thickening, or biliary dilatation. Pancreas: Unremarkable. No pancreatic ductal dilatation or surrounding inflammatory changes. Spleen: Normal in size without focal abnormality. Adrenals/Urinary Tract: The adrenal glands are within normal limits. The kidneys enhance symmetrically. No renal or ureteral calculus or obstructive uropathy bilaterally. The bladder is unremarkable. Stomach/Bowel: Stomach is within normal limits. Appendix appears normal. No evidence of bowel wall thickening, distention, or inflammatory changes. No free air or pneumatosis. Vascular/Lymphatic: Aortic atherosclerosis. No enlarged abdominal or pelvic lymph nodes. Reproductive: Status post hysterectomy. No adnexal masses. Other: No abdominopelvic ascites. A small fat containing umbilical hernia is present. Musculoskeletal: Degenerative changes are present in the thoracolumbar spine. No acute osseous abnormality is seen. IMPRESSION: No acute intra-abdominal  process. Electronically Signed   By: Thornell Sartorius M.D.   On: 03/12/2024 19:55    Procedures Procedures    Medications Ordered in ED Medications  ketorolac (TORADOL) 15 MG/ML injection 15 mg (has no administration in time range)  ondansetron (ZOFRAN) injection 4 mg (4 mg Intravenous Given 03/12/24 1843)  morphine (PF) 4 MG/ML injection 4 mg (4 mg Intravenous Given 03/12/24 1842)  potassium chloride SA (KLOR-CON M) CR tablet 40 mEq (40 mEq Oral Given 03/12/24 1841)  iohexol (OMNIPAQUE) 300 MG/ML solution 100 mL (100 mLs Intravenous Contrast Given 03/12/24 1900)    ED Course/ Medical Decision Making/ A&P Clinical Course as of 03/12/24 2037  Fri Mar 12, 2024  2027 No acute abnormality on CTAP. Symptoms likely related to Celiac flare. Patient is stable for discharge home with outpatient follow up. [VK]    Clinical Course User Index [VK] Rexford Maus, DO                                  Medical Decision Making This patient presents to the ED with chief complaint(s) of abdominal pain with pertinent past medical history of hypertension, celiac which further complicates the presenting complaint. The complaint involves an extensive differential diagnosis and also carries with it a high risk of complications and morbidity.    The differential diagnosis includes appendicitis or other intra-abdominal infection, UTI, nephrolithiasis, pyelonephritis less likely as no back pain, dehydration, electrolyte abnormality, celiac flare, gastroenteritis, gastritis, GERD  Additional history obtained: Additional history obtained from family Records reviewed  n/a  ED Course and Reassessment: On patient's arrival she is hemodynamically stable in no acute distress.  Was initially evaluated in triage and had labs performed that showed mild hypokalemia and is otherwise within normal range.  Potassium was repleted.  She will need CT abdomen pelvis to evaluate for intra-abdominal infection.  She was given pain and nausea control and will be closely reassessed.  Independent labs interpretation:  The following labs were independently interpreted: mild hypokalemia, otherwise within normal range  Independent visualization of imaging: - I independently visualized the following imaging with scope of interpretation limited to determining acute life threatening conditions related to emergency care: CTAP, which revealed no acute disease  Consultation: - Consulted or discussed management/test interpretation w/ external professional: N/A  Consideration for admission or further workup: Patient has no emergent conditions requiring admission or further work-up at this time and is stable for discharge home with primary care follow-up  Social Determinants of health: N/A    Amount and/or Complexity of Data Reviewed Labs: ordered. Radiology: ordered.  Risk Prescription drug  management.          Final Clinical Impression(s) / ED Diagnoses Final diagnoses:  Right lower quadrant abdominal pain  Celiac disease    Rx / DC Orders ED Discharge Orders          Ordered    oxyCODONE (ROXICODONE) 5 MG immediate release tablet  Every 6 hours PRN        03/12/24 2034    ondansetron (ZOFRAN) 4 MG tablet  Every 6 hours        03/12/24 2034              Rexford Maus, DO 03/12/24 2037

## 2024-03-12 NOTE — Discharge Instructions (Addendum)
 You were seen in the emergency department for your abdominal pain.  Your workup showed no signs of infection or appendicitis and this is likely related to your celiac's disease.  Your potassium was mildly low in the ER we did give you repletion.  You can take Tylenol every 6 hours as needed for pain and I have given you oxycodone for breakthrough pain.  This can make you drowsy so do not take it before driving, working or operating heavy machinery.  You can take Zofran as needed for nausea.  You can follow-up with your primary doctor or GI in the next few days to have your symptoms rechecked.  You should return to the emergency department if you have significantly worsening pain, you have fevers, repetitive vomiting despite the nausea medicine or any other new or concerning symptoms.

## 2024-03-12 NOTE — ED Triage Notes (Addendum)
 RLQ abdominal pain that started last night and has been progressively worsening. N/V, denies diarrhea. Pt PCP sent pt and is concerned for appendicitis. C/o urinary discomfort for 2-3 days.

## 2024-09-04 ENCOUNTER — Other Ambulatory Visit: Payer: Self-pay | Admitting: Family Medicine

## 2024-09-06 NOTE — Telephone Encounter (Signed)
 Last OV 10/17/22  Pt has been almost 2 years since pt has been seen, needs OV.
